# Patient Record
Sex: Male | Born: 1950 | Race: White | Hispanic: No | Smoking: Never smoker
Health system: Southern US, Community
[De-identification: ages and names within clinical notes are randomized; demographics above are authoritative.]

## PROBLEM LIST (undated history)

## (undated) DIAGNOSIS — L719 Rosacea, unspecified: Secondary | ICD-10-CM

## (undated) DIAGNOSIS — E109 Type 1 diabetes mellitus without complications: Secondary | ICD-10-CM

## (undated) DIAGNOSIS — E119 Type 2 diabetes mellitus without complications: Secondary | ICD-10-CM

## (undated) DIAGNOSIS — E785 Hyperlipidemia, unspecified: Secondary | ICD-10-CM

## (undated) DIAGNOSIS — E1159 Type 2 diabetes mellitus with other circulatory complications: Secondary | ICD-10-CM

## (undated) DIAGNOSIS — M1990 Unspecified osteoarthritis, unspecified site: Secondary | ICD-10-CM

## (undated) DIAGNOSIS — K219 Gastro-esophageal reflux disease without esophagitis: Secondary | ICD-10-CM

## (undated) HISTORY — DX: Gastro-esophageal reflux disease without esophagitis: K21.9

## (undated) HISTORY — PX: GALLBLADDER SURGERY: SHX652

## (undated) HISTORY — PX: CHOLECYSTECTOMY: SHX55

## (undated) HISTORY — DX: Hyperlipidemia, unspecified: E78.5

## (undated) HISTORY — DX: Type 1 diabetes mellitus without complications: E10.9

## (undated) HISTORY — PX: BACK SURGERY: SHX140

## (undated) HISTORY — DX: Type 2 diabetes mellitus without complications: E11.9

## (undated) HISTORY — PX: OTHER SURGICAL HISTORY: SHX169

## (undated) HISTORY — PX: LUMBAR DISC SURGERY: SHX700

---
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
## 2013-11-04 DIAGNOSIS — E782 Mixed hyperlipidemia: Secondary | ICD-10-CM | POA: Insufficient documentation

## 2013-11-04 DIAGNOSIS — E109 Type 1 diabetes mellitus without complications: Secondary | ICD-10-CM | POA: Insufficient documentation

## 2013-11-04 DIAGNOSIS — K529 Noninfective gastroenteritis and colitis, unspecified: Secondary | ICD-10-CM | POA: Insufficient documentation

## 2015-05-25 DIAGNOSIS — E1169 Type 2 diabetes mellitus with other specified complication: Secondary | ICD-10-CM | POA: Insufficient documentation

## 2015-05-25 DIAGNOSIS — E785 Hyperlipidemia, unspecified: Secondary | ICD-10-CM | POA: Insufficient documentation

## 2016-02-29 DIAGNOSIS — E1159 Type 2 diabetes mellitus with other circulatory complications: Secondary | ICD-10-CM

## 2016-02-29 DIAGNOSIS — I152 Hypertension secondary to endocrine disorders: Secondary | ICD-10-CM

## 2016-02-29 DIAGNOSIS — K219 Gastro-esophageal reflux disease without esophagitis: Secondary | ICD-10-CM | POA: Insufficient documentation

## 2016-02-29 HISTORY — DX: Hypertension secondary to endocrine disorders: I15.2

## 2016-02-29 HISTORY — DX: Type 2 diabetes mellitus with other circulatory complications: E11.59

## 2017-01-30 LAB — HM HEPATITIS C SCREENING LAB: HM Hepatitis Screen: NEGATIVE

## 2019-01-09 ENCOUNTER — Other Ambulatory Visit: Payer: Self-pay | Admitting: Physician Assistant

## 2019-01-09 NOTE — Telephone Encounter (Signed)
Please advise 

## 2019-01-21 ENCOUNTER — Ambulatory Visit: Payer: Self-pay | Admitting: Physician Assistant

## 2019-01-28 ENCOUNTER — Encounter: Payer: Self-pay | Admitting: Family Medicine

## 2019-01-28 ENCOUNTER — Other Ambulatory Visit: Payer: Self-pay

## 2019-01-28 ENCOUNTER — Ambulatory Visit (INDEPENDENT_AMBULATORY_CARE_PROVIDER_SITE_OTHER): Payer: Medicare Other | Admitting: Family Medicine

## 2019-01-28 VITALS — BP 137/82 | HR 102 | Temp 98.5°F | Ht 71.5 in | Wt 196.2 lb

## 2019-01-28 DIAGNOSIS — E782 Mixed hyperlipidemia: Secondary | ICD-10-CM

## 2019-01-28 DIAGNOSIS — Z23 Encounter for immunization: Secondary | ICD-10-CM

## 2019-01-28 DIAGNOSIS — E109 Type 1 diabetes mellitus without complications: Secondary | ICD-10-CM | POA: Diagnosis not present

## 2019-01-28 DIAGNOSIS — E1159 Type 2 diabetes mellitus with other circulatory complications: Secondary | ICD-10-CM | POA: Diagnosis not present

## 2019-01-28 DIAGNOSIS — I152 Hypertension secondary to endocrine disorders: Secondary | ICD-10-CM

## 2019-01-28 DIAGNOSIS — L719 Rosacea, unspecified: Secondary | ICD-10-CM | POA: Diagnosis not present

## 2019-01-28 DIAGNOSIS — I10 Essential (primary) hypertension: Secondary | ICD-10-CM

## 2019-01-28 DIAGNOSIS — K219 Gastro-esophageal reflux disease without esophagitis: Secondary | ICD-10-CM

## 2019-01-28 DIAGNOSIS — Z1211 Encounter for screening for malignant neoplasm of colon: Secondary | ICD-10-CM

## 2019-01-28 DIAGNOSIS — Z7689 Persons encountering health services in other specified circumstances: Secondary | ICD-10-CM

## 2019-01-28 HISTORY — DX: Rosacea, unspecified: L71.9

## 2019-01-28 LAB — BAYER DCA HB A1C WAIVED: HB A1C (BAYER DCA - WAIVED): 7 % — ABNORMAL HIGH (ref <7.0)

## 2019-01-28 MED ORDER — LEVEMIR FLEXTOUCH 100 UNIT/ML ~~LOC~~ SOPN: 48.0000 [IU] | PEN_INJECTOR | Freq: Two times a day (BID) | SUBCUTANEOUS | 1 refills | Status: DC

## 2019-01-28 MED ORDER — SHINGRIX 50 MCG/0.5ML IM SUSR: 0.5000 mL | Freq: Once | INTRAMUSCULAR | 0 refills | Status: AC

## 2019-01-28 NOTE — Progress Notes (Signed)
New Patient Office Visit  Assessment & Plan:  1. Type 1 diabetes mellitus without complications (HCC) Lab Results  Component Value Date   HGBA1C 7.0 (H) 01/28/2019    - Diabetes is at goal of A1c of 7. - Medications: continue current medications - Patient is currently taking a statin. Patient is taking an ACE-inhibitor/ARB.  - Last diabetic eye exam: not sure of date - record requested from [facility].  - Urine Microalbumin/Creat Ratio: today - CMP14+EGFR - Lipid panel - Microalbumin / creatinine urine ratio - Bayer DCA Hb A1c Waived - Insulin Detemir (LEVEMIR FLEXTOUCH) 100 UNIT/ML Pen; Inject 48 Units into the skin 2 (two) times daily.  Dispense: 90 mL; Refill: 1 - Instruction/counseling given: provided printed educational material  2. Hypertension associated with diabetes ([place]) - Well controlled on current regimen.  - CMP14+EGFR - Lipid panel  3. Mixed hyperlipidemia - Well controlled on current regimen.  - CMP14+EGFR - Lipid panel  4. Rosacea - Well controlled on current regimen.  - CMP14+EGFR  5. Gastroesophageal reflux disease, esophagitis presence not specified - Well controlled on current regimen.   6. Colon cancer screening - Cologuard  7. Immunization due - SHINGRIX injection; Inject 0.5 mLs into the muscle once for 1 dose.  Dispense: 0.5 mL; Refill: 0  8. Encounter to establish care   Follow-up: Return in about 3 months (around 04/29/2019) for follow-up of chronic medication conditions.   Hendricks Limes, MSN, APRN, FNP-C [facility]  Subjective:  Patient ID: Dustin Graham, male    DOB: Sep 01, 1950  Age: 68 y.o. MRN: [id number]  Patient Care Team: Loman Brooklyn, FNP as PCP - General (Family Medicine)  CC:  Chief Complaint  Patient presents with  . New Patient (Initial Visit)    HPI Dustin Graham presents to establish care. He is transferring care from Dr. Murrell Redden office as he has retired and the office has closed.    No complaints or concerns today.   Review of Systems  Constitutional: Negative for chills, fever, malaise/fatigue and weight loss.  HENT: Negative for congestion, ear discharge, ear pain, nosebleeds, sinus pain, sore throat and tinnitus.   Eyes: Negative for blurred vision, double vision, pain, discharge and redness.  Respiratory: Negative for cough, shortness of breath and wheezing.   Cardiovascular: Negative for chest pain, palpitations and leg swelling.  Gastrointestinal: Negative for abdominal pain, constipation, diarrhea, heartburn, nausea and vomiting.  Genitourinary: Negative for dysuria, frequency and urgency.  Musculoskeletal: Negative for myalgias.  Skin: Negative for rash.  Neurological: Negative for dizziness, seizures, weakness and headaches.  Psychiatric/Behavioral: Negative for depression, substance abuse and suicidal ideas. The patient is not nervous/anxious.      Current Outpatient Medications:  .  enalapril (VASOTEC) 10 MG tablet, Take 10 mg by mouth daily., Disp: , Rfl:  .  glucose blood (CONTOUR NEXT TEST) test strip, Test 3 times a day. Dx. E11.9, Disp: , Rfl:  .  insulin aspart (NOVOLOG FLEXPEN) 100 UNIT/ML FlexPen, INJECT 30 UNITS DOSE INTO THE SKIN 15 (FIFTEEN) MINUTES BEFORE MEALS., Disp: , Rfl:  .  Insulin Detemir (LEVEMIR FLEXTOUCH) 100 UNIT/ML Pen, Inject 48 Units into the skin 2 (two) times daily., Disp: 90 mL, Rfl: 1 .  Insulin Pen Needle 32G X 6 MM MISC, USE WITH INJECTION 6 TIMES DAILY, Disp: , Rfl:  .  omeprazole (PRILOSEC) 20 MG capsule, Take 20 mg by mouth daily., Disp: , Rfl:  .  OneTouch Delica Lancets 93Z MISC, 1 Device by Other route  2 (two) times daily. Dx:e11.9, Disp: , Rfl:  .  Pitavastatin Calcium (LIVALO) 4 MG TABS, Take 4 mg by mouth daily., Disp: , Rfl:  .  doxycycline (VIBRAMYCIN) 100 MG capsule, TAKE 1 CAPSULE BY MOUTH EVERY DAY, Disp: 90 capsule, Rfl: 1  Allergies  Allergen Reactions  . Codeine Nausea And Vomiting    Past Medical  History:  Diagnosis Date  . Diabetes mellitus without complication ([place])   . GERD (gastroesophageal reflux disease)   . Hypertension associated with diabetes ([place]) 02/29/2016  . Rosacea 01/28/2019    History reviewed. No pertinent surgical history.  Family History  Problem Relation Age of Onset  . Diabetes Paternal Aunt     Social History   Socioeconomic History  . Marital status: Divorced    Spouse name: Not on file  . Number of children: 1  . Years of education: 66  . Highest education level: Not on file  Occupational History  . Not on file  Social Needs  . Financial resource strain: Not on file  . Food insecurity    Worry: Not on file    Inability: Not on file  . Transportation needs    Medical: Not on file    Non-medical: Not on file  Tobacco Use  . Smoking status: Never Smoker  . Smokeless tobacco: Never Used  Substance and Sexual Activity  . Alcohol use: Yes    Comment: two shots daily  . Drug use: Never  . Sexual activity: Not on file  Lifestyle  . Physical activity    Days per week: Not on file    Minutes per session: Not on file  . Stress: Not on file  Relationships  . Social Herbalist on phone: Not on file    Gets together: Not on file    Attends religious service: Not on file    Active member of club or organization: Not on file    Attends meetings of clubs or organizations: Not on file    Relationship status: Not on file  . Intimate partner violence    Fear of current or ex partner: Not on file    Emotionally abused: Not on file    Physically abused: Not on file    Forced sexual activity: Not on file  Other Topics Concern  . Not on file  Social History Narrative  . Not on file    Objective:   Today's Vitals: BP 137/82   Pulse (!) 102   Temp 98.5 F (36.9 C) (Oral)   Ht 5' 11.5" (1.816 m)   Wt 196 lb 3.2 oz (89 kg)   BMI 26.98 kg/m   Physical Exam Vitals signs reviewed.  Constitutional:      General: He is not in acute  distress.    Appearance: Normal appearance. He is overweight. He is not ill-appearing, toxic-appearing or diaphoretic.  HENT:     Head: Normocephalic and atraumatic.  Eyes:     General: No scleral icterus.       Right eye: No discharge.        Left eye: No discharge.     Conjunctiva/sclera: Conjunctivae normal.  Neck:     Musculoskeletal: Normal range of motion.  Cardiovascular:     Rate and Rhythm: Normal rate and regular rhythm.     Heart sounds: Normal heart sounds. No murmur. No friction rub. No gallop.   Pulmonary:     Effort: Pulmonary effort is normal. No respiratory distress.  Breath sounds: Normal breath sounds. No stridor. No wheezing, rhonchi or rales.  Musculoskeletal: Normal range of motion.  Skin:    General: Skin is warm and dry.  Neurological:     Mental Status: He is alert and oriented to person, place, and time. Mental status is at baseline.  Psychiatric:        Mood and Affect: Mood normal.        Behavior: Behavior normal.        Thought Content: Thought content normal.        Judgment: Judgment normal.

## 2019-01-29 LAB — CMP14+EGFR
ALT: 36 IU/L (ref 0–44)
AST: 54 IU/L — ABNORMAL HIGH (ref 0–40)
Albumin/Globulin Ratio: 1.4 (ref 1.2–2.2)
Albumin: 4.2 g/dL (ref 3.8–4.8)
Alkaline Phosphatase: 125 IU/L — ABNORMAL HIGH (ref 39–117)
BUN/Creatinine Ratio: 12 (ref 10–24)
BUN: 8 mg/dL (ref 8–27)
Bilirubin Total: 0.9 mg/dL (ref 0.0–1.2)
CO2: 25 mmol/L (ref 20–29)
Calcium: 9.5 mg/dL (ref 8.6–10.2)
Chloride: 99 mmol/L (ref 96–106)
Creatinine, Ser: 0.66 mg/dL — ABNORMAL LOW (ref 0.76–1.27)
GFR calc Af Amer: 116 mL/min/{1.73_m2} (ref >59)
GFR calc non Af Amer: 100 mL/min/{1.73_m2} (ref >59)
Globulin, Total: 3.1 g/dL (ref 1.5–4.5)
Glucose: 182 mg/dL — ABNORMAL HIGH (ref 65–99)
Potassium: 5.3 mmol/L — ABNORMAL HIGH (ref 3.5–5.2)
Sodium: 139 mmol/L (ref 134–144)
Total Protein: 7.3 g/dL (ref 6.0–8.5)

## 2019-01-29 LAB — LIPID PANEL
Chol/HDL Ratio: 2.6 ratio (ref 0.0–5.0)
Cholesterol, Total: 206 mg/dL — ABNORMAL HIGH (ref 100–199)
HDL: 78 mg/dL (ref >39)
LDL Chol Calc (NIH): 116 mg/dL — ABNORMAL HIGH (ref 0–99)
Triglycerides: 66 mg/dL (ref 0–149)
VLDL Cholesterol Cal: 12 mg/dL (ref 5–40)

## 2019-01-30 ENCOUNTER — Other Ambulatory Visit: Payer: Self-pay | Admitting: Family Medicine

## 2019-02-12 ENCOUNTER — Other Ambulatory Visit: Payer: Self-pay | Admitting: Family Medicine

## 2019-04-21 ENCOUNTER — Telehealth: Payer: Self-pay | Admitting: Family Medicine

## 2019-04-21 NOTE — Telephone Encounter (Signed)
[email or web address]

## 2019-04-21 NOTE — Telephone Encounter (Signed)
Please call patient and follow-up on Cologuard that was ordered on 01/28/2019.  Encourage to collect and send back.

## 2019-05-01 NOTE — Telephone Encounter (Signed)
Patient confirmed he did receive Cologuard.  He has not even opened the box yet.  Encouraged him to complete and return.

## 2019-05-06 ENCOUNTER — Ambulatory Visit: Payer: Medicare Other | Admitting: Family Medicine

## 2019-05-06 ENCOUNTER — Other Ambulatory Visit: Payer: Self-pay | Admitting: Family Medicine

## 2019-06-10 LAB — HM DIABETES EYE EXAM

## 2019-06-29 NOTE — Progress Notes (Signed)
Assessment & Plan:  1. Type 1 diabetes mellitus without complications (HCC) Lab Results  Component Value Date   HGBA1C 6.9 07/01/2019   HGBA1C 7.0 (H) 01/28/2019  - Diabetes is at goal of A1c < 7. - Medications: continue current medications - Home glucose monitoring: continue 2-4 times/day - Patient is currently taking a statin. Patient is taking an ACE-inhibitor/ARB.  - Last foot exam: 07/01/2019 - Last diabetic eye exam: 05/2019 per patient - record requested from [facility].  - Urine Microalbumin/Creat Ratio: 2017 - Instruction/counseling given: discussed the need for weight loss, discussed diet and provided printed educational material - CMP14+EGFR - Lipid panel - Bayer DCA Hb A1c Waived - Insulin Detemir (LEVEMIR FLEXTOUCH) 100 UNIT/ML Pen; Inject 48 Units into the skin 2 (two) times daily.  Dispense: 90 mL; Refill: 1  2. Acute right-sided low back pain without sciatica - Education provided on low back exercises. Encouraged Ibuprofen 400-800 mg WITH Tylenol 500 mg every 6-8 hours as needed for pain, but to stop the BC powders. Also encouraged heating pad and muscle rubs which patient does not see himself doing.   3. Hypertension associated with diabetes ([place]) - Well controlled on current regimen.  - CMP14+EGFR - Lipid panel  4. Mixed hyperlipidemia - Well controlled on current regimen.  - CMP14+EGFR - Lipid panel  5. Callus of foot - Encouraged patient to soak foot and file callus down with pumice stone.   6. Healthcare maintenance - Encouraged patient to complete cologuard that he has at home. Scheduled for 1st COVID-19 vaccine tomorrow. Declined Shingrix and TDAP.   Return in about 6 months (around 12/29/2019) for follow-up of chronic medication conditions.  Hendricks Limes, MSN, APRN, FNP-C [facility]  Subjective:    Patient ID: Dustin Graham, male    DOB: 09-27-1950, 69 y.o.   MRN: [id number]  Patient Care Team: Loman Brooklyn, FNP as PCP  - General (Family Medicine)   Chief Complaint:  Chief Complaint  Patient presents with  . Medical Management of Chronic Issues    lower back for a months  . Diabetes    HPI: Dustin Graham is a 69 y.o. male presenting on 07/01/2019 for Medical Management of Chronic Issues (lower back for a months) and Diabetes  Diabetes: Patient presents for follow up of diabetes. Current symptoms include: none. Known diabetic complications: none. Medication compliance: yes. Current diet: in general, an "unhealthy" diet. Current exercise: none. Home blood sugar records: 2-4 times per day; fasting averages 120-130s. Is he  on ACE inhibitor or angiotensin II receptor blocker? Yes. Is he on a statin? Yes.   Lab Results  Component Value Date   HGBA1C 6.9 07/01/2019   HGBA1C 7.0 (H) 01/28/2019   Lab Results  Component Value Date   LDLCALC 116 (H) 01/28/2019   CREATININE 0.66 (L) 01/28/2019     New complaints: Back Pain: Patient presents for presents evaluation of low back problems.  Symptoms have been present for several months and include pain in right side of low back (aching in character; 5/10 in severity). He reports it is improving as two weeks ago he was experiencing what felt like a jolts of electricity and rated his pain 7/10. Initial inciting event: patinet was sleep walking and tripped over a chair.  Alleviating factors identifiable by patient are medication BC powder. Exacerbating factors identifiable by patient are movement - turning, standing, walking.    Social history:  Relevant past medical, surgical, family and social history reviewed and updated  as indicated. Interim medical history since our last visit reviewed.  Allergies and medications reviewed and updated.  DATA REVIEWED: CHART IN EPIC  ROS: Negative unless specifically indicated above in HPI.    Current Outpatient Medications:  .  Aspirin-Salicylamide-Caffeine (BC HEADACHE POWDER PO), Take by mouth., Disp: , Rfl:  .   doxycycline (VIBRAMYCIN) 100 MG capsule, TAKE 1 CAPSULE BY MOUTH EVERY DAY, Disp: 90 capsule, Rfl: 1 .  enalapril (VASOTEC) 10 MG tablet, Take 1 tablet (10 mg total) by mouth daily., Disp: 90 tablet, Rfl: 1 .  glucose blood (CONTOUR NEXT TEST) test strip, Test 3 times a day. Dx. E11.9, Disp: , Rfl:  .  insulin aspart (NOVOLOG FLEXPEN) 100 UNIT/ML FlexPen, INJECT 30 UNITS DOSE INTO THE SKIN 15 (FIFTEEN) MINUTES BEFORE MEALS., Disp: , Rfl:  .  Insulin Detemir (LEVEMIR FLEXTOUCH) 100 UNIT/ML Pen, Inject 48 Units into the skin 2 (two) times daily., Disp: 90 mL, Rfl: 1 .  Insulin Pen Needle 32G X 6 MM MISC, USE WITH INJECTION 6 TIMES DAILY, Disp: , Rfl:  .  omeprazole (PRILOSEC) 20 MG capsule, Take 1 capsule (20 mg total) by mouth daily., Disp: 90 capsule, Rfl: 1 .  OneTouch Delica Lancets 24O MISC, 1 Device by Other route 2 (two) times daily. Dx:e11.9, Disp: , Rfl:  .  Pitavastatin Calcium (LIVALO) 4 MG TABS, Take 1 tablet (4 mg total) by mouth daily., Disp: 90 tablet, Rfl: 1   Allergies  Allergen Reactions  . Codeine Nausea And Vomiting   Past Medical History:  Diagnosis Date  . Diabetes mellitus without complication ([place])   . GERD (gastroesophageal reflux disease)   . Hypertension associated with diabetes ([place]) 02/29/2016  . Rosacea 01/28/2019    History reviewed. No pertinent surgical history.  Social History   Socioeconomic History  . Marital status: Divorced    Spouse name: Not on file  . Number of children: 1  . Years of education: 87  . Highest education level: Not on file  Occupational History  . Not on file  Tobacco Use  . Smoking status: Never Smoker  . Smokeless tobacco: Never Used  Substance and Sexual Activity  . Alcohol use: Yes    Comment: two shots daily  . Drug use: Never  . Sexual activity: Not on file  Other Topics Concern  . Not on file  Social History Narrative  . Not on file   Social Determinants of Health   Financial Resource Strain:   . Difficulty of  Paying Living Expenses: Not on file  Food Insecurity:   . Worried About Charity fundraiser in the Last Year: Not on file  . Ran Out of Food in the Last Year: Not on file  Transportation Needs:   . Lack of Transportation (Medical): Not on file  . Lack of Transportation (Non-Medical): Not on file  Physical Activity:   . Days of Exercise per Week: Not on file  . Minutes of Exercise per Session: Not on file  Stress:   . Feeling of Stress : Not on file  Social Connections:   . Frequency of Communication with Friends and Family: Not on file  . Frequency of Social Gatherings with Friends and Family: Not on file  . Attends Religious Services: Not on file  . Active Member of Clubs or Organizations: Not on file  . Attends Archivist Meetings: Not on file  . Marital Status: Not on file  Intimate Partner Violence:   . Fear of  Current or Ex-Partner: Not on file  . Emotionally Abused: Not on file  . Physically Abused: Not on file  . Sexually Abused: Not on file        Objective:    BP 140/78 Comment: manual  Pulse (!) 108   Temp 97.8 F (36.6 C) (Temporal)   Ht 5' 11.5" (1.816 m)   Wt 198 lb 3.2 oz (89.9 kg)   SpO2 97%   BMI 27.26 kg/m   Physical Exam Vitals reviewed.  Constitutional:      General: He is not in acute distress.    Appearance: Normal appearance. He is overweight. He is not ill-appearing, toxic-appearing or diaphoretic.  HENT:     Head: Normocephalic and atraumatic.  Eyes:     General: No scleral icterus.       Right eye: No discharge.        Left eye: No discharge.     Conjunctiva/sclera: Conjunctivae normal.  Cardiovascular:     Rate and Rhythm: Normal rate and regular rhythm.     Heart sounds: Normal heart sounds. No murmur. No friction rub. No gallop.   Pulmonary:     Effort: Pulmonary effort is normal. No respiratory distress.     Breath sounds: Normal breath sounds. No stridor. No wheezing, rhonchi or rales.  Musculoskeletal:        General:  Normal range of motion.     Cervical back: Normal range of motion.     Lumbar back: Tenderness (right SI joint) present. No swelling, edema, deformity, signs of trauma, lacerations or spasms. Normal range of motion. No scoliosis.  Skin:    General: Skin is warm and dry.  Neurological:     Mental Status: He is alert and oriented to person, place, and time. Mental status is at baseline.  Psychiatric:        Mood and Affect: Mood normal.        Behavior: Behavior normal.        Thought Content: Thought content normal.        Judgment: Judgment normal.    Diabetic Foot Exam - Simple   Simple Foot Form Diabetic Foot exam was performed with the following findings: Yes 07/01/2019 11:31 AM  Visual Inspection See comments: Yes Sensation Testing Intact to touch and monofilament testing bilaterally: Yes Pulse Check Posterior Tibialis and Dorsalis pulse intact bilaterally: Yes Comments Small callus to the bottom of left foot just below pinky toe. Otherwise, no deformities, ulcerations, or other skin breakdown bilaterally.      No results found for: TSH No results found for: WBC, HGB, HCT, MCV, PLT Lab Results  Component Value Date   NA 139 01/28/2019   K 5.3 (H) 01/28/2019   CO2 25 01/28/2019   GLUCOSE 182 (H) 01/28/2019   BUN 8 01/28/2019   CREATININE 0.66 (L) 01/28/2019   BILITOT 0.9 01/28/2019   ALKPHOS 125 (H) 01/28/2019   AST 54 (H) 01/28/2019   ALT 36 01/28/2019   PROT 7.3 01/28/2019   ALBUMIN 4.2 01/28/2019   CALCIUM 9.5 01/28/2019   Lab Results  Component Value Date   CHOL 206 (H) 01/28/2019   Lab Results  Component Value Date   HDL 78 01/28/2019   Lab Results  Component Value Date   LDLCALC 116 (H) 01/28/2019   Lab Results  Component Value Date   TRIG 66 01/28/2019   Lab Results  Component Value Date   CHOLHDL 2.6 01/28/2019   Lab Results  Component Value Date  HGBA1C 7.0 (H) 01/28/2019

## 2019-06-30 ENCOUNTER — Other Ambulatory Visit: Payer: Self-pay

## 2019-07-01 ENCOUNTER — Encounter: Payer: Self-pay | Admitting: Family Medicine

## 2019-07-01 ENCOUNTER — Ambulatory Visit (INDEPENDENT_AMBULATORY_CARE_PROVIDER_SITE_OTHER): Payer: Medicare Other | Admitting: Family Medicine

## 2019-07-01 VITALS — BP 140/78 | HR 108 | Temp 97.8°F | Ht 71.5 in | Wt 198.2 lb

## 2019-07-01 DIAGNOSIS — E109 Type 1 diabetes mellitus without complications: Secondary | ICD-10-CM

## 2019-07-01 DIAGNOSIS — I10 Essential (primary) hypertension: Secondary | ICD-10-CM

## 2019-07-01 DIAGNOSIS — E1159 Type 2 diabetes mellitus with other circulatory complications: Secondary | ICD-10-CM | POA: Diagnosis not present

## 2019-07-01 DIAGNOSIS — E782 Mixed hyperlipidemia: Secondary | ICD-10-CM | POA: Diagnosis not present

## 2019-07-01 DIAGNOSIS — M545 Low back pain, unspecified: Secondary | ICD-10-CM

## 2019-07-01 DIAGNOSIS — Z0000 Encounter for general adult medical examination without abnormal findings: Secondary | ICD-10-CM

## 2019-07-01 DIAGNOSIS — L84 Corns and callosities: Secondary | ICD-10-CM

## 2019-07-01 LAB — BAYER DCA HB A1C WAIVED: HB A1C (BAYER DCA - WAIVED): 6.9 % (ref <7.0)

## 2019-07-01 MED ORDER — OMEPRAZOLE 20 MG PO CPDR: 20.0000 mg | DELAYED_RELEASE_CAPSULE | Freq: Every day | ORAL | 1 refills | Status: DC

## 2019-07-01 MED ORDER — LEVEMIR FLEXTOUCH 100 UNIT/ML ~~LOC~~ SOPN: 48.0000 [IU] | PEN_INJECTOR | Freq: Two times a day (BID) | SUBCUTANEOUS | 1 refills | Status: DC

## 2019-07-01 MED ORDER — DOXYCYCLINE HYCLATE 100 MG PO CAPS: ORAL_CAPSULE | ORAL | 1 refills | Status: DC

## 2019-07-01 MED ORDER — ENALAPRIL MALEATE 10 MG PO TABS: 10.0000 mg | ORAL_TABLET | Freq: Every day | ORAL | 1 refills | Status: DC

## 2019-07-01 MED ORDER — LIVALO 4 MG PO TABS: 4.0000 mg | ORAL_TABLET | Freq: Every day | ORAL | 1 refills | Status: DC

## 2019-07-01 NOTE — Patient Instructions (Signed)
Muscle rubs Heating pad  Low Back Sprain or Strain Rehab Ask your health care provider which exercises are safe for you. Do exercises exactly as told by your health care provider and adjust them as directed. It is normal to feel mild stretching, pulling, tightness, or discomfort as you do these exercises. Stop right away if you feel sudden pain or your pain gets worse. Do not begin these exercises until told by your health care provider. Stretching and range-of-motion exercises These exercises warm up your muscles and joints and improve the movement and flexibility of your back. These exercises also help to relieve pain, numbness, and tingling. Lumbar rotation  1. Lie on your back on a firm surface and bend your knees. 2. Straighten your arms out to your sides so each arm forms a 90-degree angle (right angle) with a side of your body. 3. Slowly move (rotate) both of your knees to one side of your body until you feel a stretch in your lower back (lumbar). Try not to let your shoulders lift off the floor. 4. Hold this position for __________ seconds. 5. Tense your abdominal muscles and slowly move your knees back to the starting position. 6. Repeat this exercise on the other side of your body. Repeat __________ times. Complete this exercise __________ times a day. Single knee to chest  1. Lie on your back on a firm surface with both legs straight. 2. Bend one of your knees. Use your hands to move your knee up toward your chest until you feel a gentle stretch in your lower back and buttock. ? Hold your leg in this position by holding on to the front of your knee. ? Keep your other leg as straight as possible. 3. Hold this position for __________ seconds. 4. Slowly return to the starting position. 5. Repeat with your other leg. Repeat __________ times. Complete this exercise __________ times a day. Prone extension on elbows  1. Lie on your abdomen on a firm surface (prone position). 2. Prop  yourself up on your elbows. 3. Use your arms to help lift your chest up until you feel a gentle stretch in your abdomen and your lower back. ? This will place some of your body weight on your elbows. If this is uncomfortable, try stacking pillows under your chest. ? Your hips should stay down, against the surface that you are lying on. Keep your hip and back muscles relaxed. 4. Hold this position for __________ seconds. 5. Slowly relax your upper body and return to the starting position. Repeat __________ times. Complete this exercise __________ times a day. Strengthening exercises These exercises build strength and endurance in your back. Endurance is the ability to use your muscles for a long time, even after they get tired. Pelvic tilt This exercise strengthens the muscles that lie deep in the abdomen. 1. Lie on your back on a firm surface. Bend your knees and keep your feet flat on the floor. 2. Tense your abdominal muscles. Tip your pelvis up toward the ceiling and flatten your lower back into the floor. ? To help with this exercise, you may place a small towel under your lower back and try to push your back into the towel. 3. Hold this position for __________ seconds. 4. Let your muscles relax completely before you repeat this exercise. Repeat __________ times. Complete this exercise __________ times a day. Alternating arm and leg raises  1. Get on your hands and knees on a firm surface. If you are  on a hard floor, you may want to use padding, such as an exercise mat, to cushion your knees. 2. Line up your arms and legs. Your hands should be directly below your shoulders, and your knees should be directly below your hips. 3. Lift your left leg behind you. At the same time, raise your right arm and straighten it in front of you. ? Do not lift your leg higher than your hip. ? Do not lift your arm higher than your shoulder. ? Keep your abdominal and back muscles tight. ? Keep your hips  facing the ground. ? Do not arch your back. ? Keep your balance carefully, and do not hold your breath. 4. Hold this position for __________ seconds. 5. Slowly return to the starting position. 6. Repeat with your right leg and your left arm. Repeat __________ times. Complete this exercise __________ times a day. Abdominal set with straight leg raise  1. Lie on your back on a firm surface. 2. Bend one of your knees and keep your other leg straight. 3. Tense your abdominal muscles and lift your straight leg up, 4-6 inches (10-15 cm) off the ground. 4. Keep your abdominal muscles tight and hold this position for __________ seconds. ? Do not hold your breath. ? Do not arch your back. Keep it flat against the ground. 5. Keep your abdominal muscles tense as you slowly lower your leg back to the starting position. 6. Repeat with your other leg. Repeat __________ times. Complete this exercise __________ times a day. Single leg lower with bent knees 1. Lie on your back on a firm surface. 2. Tense your abdominal muscles and lift your feet off the floor, one foot at a time, so your knees and hips are bent in 90-degree angles (right angles). ? Your knees should be over your hips and your lower legs should be parallel to the floor. 3. Keeping your abdominal muscles tense and your knee bent, slowly lower one of your legs so your toe touches the ground. 4. Lift your leg back up to return to the starting position. ? Do not hold your breath. ? Do not let your back arch. Keep your back flat against the ground. 5. Repeat with your other leg. Repeat __________ times. Complete this exercise __________ times a day. Posture and body mechanics Good posture and healthy body mechanics can help to relieve stress in your body's tissues and joints. Body mechanics refers to the movements and positions of your body while you do your daily activities. Posture is part of body mechanics. Good posture means:  Your spine  is in its natural S-curve position (neutral).  Your shoulders are pulled back slightly.  Your head is not tipped forward. Follow these guidelines to improve your posture and body mechanics in your everyday activities. Standing   When standing, keep your spine neutral and your feet about hip width apart. Keep a slight bend in your knees. Your ears, shoulders, and hips should line up.  When you do a task in which you stand in one place for a long time, place one foot up on a stable object that is 2-4 inches (5-10 cm) high, such as a footstool. This helps keep your spine neutral. Sitting   When sitting, keep your spine neutral and keep your feet flat on the floor. Use a footrest, if necessary, and keep your thighs parallel to the floor. Avoid rounding your shoulders, and avoid tilting your head forward.  When working at a desk or  a computer, keep your desk at a height where your hands are slightly lower than your elbows. Slide your chair under your desk so you are close enough to maintain good posture.  When working at a computer, place your monitor at a height where you are looking straight ahead and you do not have to tilt your head forward or downward to look at the screen. Resting  When lying down and resting, avoid positions that are most painful for you.  If you have pain with activities such as sitting, bending, stooping, or squatting, lie in a position in which your body does not bend very much. For example, avoid curling up on your side with your arms and knees near your chest (fetal position).  If you have pain with activities such as standing for a long time or reaching with your arms, lie with your spine in a neutral position and bend your knees slightly. Try the following positions: ? Lying on your side with a pillow between your knees. ? Lying on your back with a pillow under your knees. Lifting   When lifting objects, keep your feet at least shoulder width apart and  tighten your abdominal muscles.  Bend your knees and hips and keep your spine neutral. It is important to lift using the strength of your legs, not your back. Do not lock your knees straight out.  Always ask for help to lift heavy or awkward objects. This information is not intended to replace advice given to you by your health care provider. Make sure you discuss any questions you have with your health care provider. Document Revised: 09/05/2018 Document Reviewed: 06/05/2018 Elsevier Patient Education  [place].   Diabetes Mellitus and Nutrition, Adult When you have diabetes (diabetes mellitus), it is very important to have healthy eating habits because your blood sugar (glucose) levels are greatly affected by what you eat and drink. Eating healthy foods in the appropriate amounts, at about the same times every day, can help you:  Control your blood glucose.  Lower your risk of heart disease.  Improve your blood pressure.  Reach or maintain a healthy weight. Every person with diabetes is different, and each person has different needs for a meal plan. Your health care provider may recommend that you work with a diet and nutrition specialist (dietitian) to make a meal plan that is best for you. Your meal plan may vary depending on factors such as:  The calories you need.  The medicines you take.  Your weight.  Your blood glucose, blood pressure, and cholesterol levels.  Your activity level.  Other health conditions you have, such as heart or kidney disease. How do carbohydrates affect me? Carbohydrates, also called carbs, affect your blood glucose level more than any other type of food. Eating carbs naturally raises the amount of glucose in your blood. Carb counting is a method for keeping track of how many carbs you eat. Counting carbs is important to keep your blood glucose at a healthy level, especially if you use insulin or take certain oral diabetes medicines. It is  important to know how many carbs you can safely have in each meal. This is different for every person. Your dietitian can help you calculate how many carbs you should have at each meal and for each snack. Foods that contain carbs include:  Bread, cereal, rice, pasta, and crackers.  Potatoes and corn.  Peas, beans, and lentils.  Milk and yogurt.  Fruit and juice.  Desserts,  such as cakes, cookies, ice cream, and candy. How does alcohol affect me? Alcohol can cause a sudden decrease in blood glucose (hypoglycemia), especially if you use insulin or take certain oral diabetes medicines. Hypoglycemia can be a life-threatening condition. Symptoms of hypoglycemia (sleepiness, dizziness, and confusion) are similar to symptoms of having too much alcohol. If your health care provider says that alcohol is safe for you, follow these guidelines:  Limit alcohol intake to no more than 1 drink per day for nonpregnant women and 2 drinks per day for men. One drink equals 12 oz of beer, 5 oz of wine, or 1 oz of hard liquor.  Do not drink on an empty stomach.  Keep yourself hydrated with water, diet soda, or unsweetened iced tea.  Keep in mind that regular soda, juice, and other mixers may contain a lot of sugar and must be counted as carbs. What are tips for following this plan?  Reading food labels  Start by checking the serving size on the "Nutrition Facts" label of packaged foods and drinks. The amount of calories, carbs, fats, and other nutrients listed on the label is based on one serving of the item. Many items contain more than one serving per package.  Check the total grams (g) of carbs in one serving. You can calculate the number of servings of carbs in one serving by dividing the total carbs by 15. For example, if a food has 30 g of total carbs, it would be equal to 2 servings of carbs.  Check the number of grams (g) of saturated and trans fats in one serving. Choose foods that have low or  no amount of these fats.  Check the number of milligrams (mg) of salt (sodium) in one serving. Most people should limit total sodium intake to less than 2,300 mg per day.  Always check the nutrition information of foods labeled as "low-fat" or "nonfat". These foods may be higher in added sugar or refined carbs and should be avoided.  Talk to your dietitian to identify your daily goals for nutrients listed on the label. Shopping  Avoid buying canned, premade, or processed foods. These foods tend to be high in fat, sodium, and added sugar.  Shop around the outside edge of the grocery store. This includes fresh fruits and vegetables, bulk grains, fresh meats, and fresh dairy. Cooking  Use low-heat cooking methods, such as baking, instead of high-heat cooking methods like deep frying.  Cook using healthy oils, such as olive, canola, or sunflower oil.  Avoid cooking with butter, cream, or high-fat meats. Meal planning  Eat meals and snacks regularly, preferably at the same times every day. Avoid going long periods of time without eating.  Eat foods high in fiber, such as fresh fruits, vegetables, beans, and whole grains. Talk to your dietitian about how many servings of carbs you can eat at each meal.  Eat 4-6 ounces (oz) of lean protein each day, such as lean meat, chicken, fish, eggs, or tofu. One oz of lean protein is equal to: ? 1 oz of meat, chicken, or fish. ? 1 egg. ?  cup of tofu.  Eat some foods each day that contain healthy fats, such as avocado, nuts, seeds, and fish. Lifestyle  Check your blood glucose regularly.  Exercise regularly as told by your health care provider. This may include: ? 150 minutes of moderate-intensity or vigorous-intensity exercise each week. This could be brisk walking, biking, or water aerobics. ? Stretching and doing strength exercises,  such as yoga or weightlifting, at least 2 times a week.  Take medicines as told by your health care  provider.  Do not use any products that contain nicotine or tobacco, such as cigarettes and e-cigarettes. If you need help quitting, ask your health care provider.  Work with a Social worker or diabetes educator to identify strategies to manage stress and any emotional and social challenges. Questions to ask a health care provider  Do I need to meet with a diabetes educator?  Do I need to meet with a dietitian?  What number can I call if I have questions?  When are the best times to check my blood glucose? Where to find more information:  American Diabetes Association: diabetes.org  Academy of Nutrition and Dietetics: www.eatright.[employer] of Diabetes and Digestive and Kidney Diseases (NIH): [employer] Summary  A healthy meal plan will help you control your blood glucose and maintain a healthy lifestyle.  Working with a diet and nutrition specialist (dietitian) can help you make a meal plan that is best for you.  Keep in mind that carbohydrates (carbs) and alcohol have immediate effects on your blood glucose levels. It is important to count carbs and to use alcohol carefully. This information is not intended to replace advice given to you by your health care provider. Make sure you discuss any questions you have with your health care provider. Document Revised: 04/26/2017 Document Reviewed: 06/18/2016 Elsevier Patient Education  2020 [employer].

## 2019-07-02 ENCOUNTER — Encounter: Payer: Self-pay | Admitting: Family Medicine

## 2019-07-02 LAB — CMP14+EGFR
ALT: 29 IU/L (ref 0–44)
AST: 47 IU/L — ABNORMAL HIGH (ref 0–40)
Albumin/Globulin Ratio: 1.4 (ref 1.2–2.2)
Albumin: 4.3 g/dL (ref 3.8–4.8)
Alkaline Phosphatase: 115 IU/L (ref 39–117)
BUN/Creatinine Ratio: 9 — ABNORMAL LOW (ref 10–24)
BUN: 6 mg/dL — ABNORMAL LOW (ref 8–27)
Bilirubin Total: 1.2 mg/dL (ref 0.0–1.2)
CO2: 25 mmol/L (ref 20–29)
Calcium: 9.4 mg/dL (ref 8.6–10.2)
Chloride: 100 mmol/L (ref 96–106)
Creatinine, Ser: 0.67 mg/dL — ABNORMAL LOW (ref 0.76–1.27)
GFR calc Af Amer: 114 mL/min/{1.73_m2} (ref >59)
GFR calc non Af Amer: 99 mL/min/{1.73_m2} (ref >59)
Globulin, Total: 3.1 g/dL (ref 1.5–4.5)
Glucose: 217 mg/dL — ABNORMAL HIGH (ref 65–99)
Potassium: 5.2 mmol/L (ref 3.5–5.2)
Sodium: 142 mmol/L (ref 134–144)
Total Protein: 7.4 g/dL (ref 6.0–8.5)

## 2019-07-02 LAB — LIPID PANEL
Chol/HDL Ratio: 2.4 ratio (ref 0.0–5.0)
Cholesterol, Total: 216 mg/dL — ABNORMAL HIGH (ref 100–199)
HDL: 89 mg/dL (ref >39)
LDL Chol Calc (NIH): 114 mg/dL — ABNORMAL HIGH (ref 0–99)
Triglycerides: 76 mg/dL (ref 0–149)
VLDL Cholesterol Cal: 13 mg/dL (ref 5–40)

## 2019-07-07 ENCOUNTER — Other Ambulatory Visit: Payer: Self-pay | Admitting: Family Medicine

## 2019-07-07 DIAGNOSIS — E782 Mixed hyperlipidemia: Secondary | ICD-10-CM

## 2019-07-07 MED ORDER — ATORVASTATIN CALCIUM 40 MG PO TABS: 40.0000 mg | ORAL_TABLET | Freq: Every day | ORAL | 2 refills | Status: DC

## 2019-07-14 ENCOUNTER — Telehealth: Payer: Self-pay | Admitting: Family Medicine

## 2019-07-15 NOTE — Telephone Encounter (Signed)
Someone had already reviewed labs with patient.  He was calling to confirm he is supposed to go ahead and start the Atorvastatin.  Advised patient to start medication.

## 2019-08-12 ENCOUNTER — Other Ambulatory Visit: Payer: Self-pay | Admitting: Family Medicine

## 2019-08-12 DIAGNOSIS — E1169 Type 2 diabetes mellitus with other specified complication: Secondary | ICD-10-CM

## 2019-08-12 DIAGNOSIS — E1159 Type 2 diabetes mellitus with other circulatory complications: Secondary | ICD-10-CM

## 2019-08-17 ENCOUNTER — Encounter: Payer: Self-pay | Admitting: Family Medicine

## 2019-08-17 ENCOUNTER — Other Ambulatory Visit: Payer: Self-pay | Admitting: Family Medicine

## 2019-08-17 ENCOUNTER — Ambulatory Visit (INDEPENDENT_AMBULATORY_CARE_PROVIDER_SITE_OTHER): Payer: Medicare Other | Admitting: Family Medicine

## 2019-08-17 DIAGNOSIS — L309 Dermatitis, unspecified: Secondary | ICD-10-CM | POA: Diagnosis not present

## 2019-08-17 DIAGNOSIS — E109 Type 1 diabetes mellitus without complications: Secondary | ICD-10-CM

## 2019-08-17 MED ORDER — NYSTATIN-TRIAMCINOLONE 100000-0.1 UNIT/GM-% EX OINT: 1.0000 "application " | TOPICAL_OINTMENT | Freq: Two times a day (BID) | CUTANEOUS | 3 refills | Status: DC

## 2019-08-17 NOTE — Progress Notes (Signed)
Virtual Visit via telephone Note  I connected with Dustin Graham on 08/17/19 at 1316 by telephone and verified that I am speaking with the correct person using two identifiers. Dustin Graham is currently located at work and no other people are currently with her during visit. The provider, Fransisca Kaufmann Rafi Kenneth, MD is located in their office at time of visit.  Call ended at 1325  I discussed the limitations, risks, security and privacy concerns of performing an evaluation and management service by telephone and the availability of in person appointments. I also discussed with the patient that there may be a patient responsible charge related to this service. The patient expressed understanding and agreed to proceed.   History and Present Illness: Patient is calling in today with complaints of rash on his right neck and used an OTC cream and it will improve and then come back.  It itches and burns and then improves.  The rash has come back 3 times.  The rash is on his right side under ear to shoulder.  It feels rough and lessened with cream.   1. Dermatitis     Outpatient Encounter Medications as of 08/17/2019  Medication Sig  . Aspirin-Salicylamide-Caffeine (BC HEADACHE POWDER PO) Take by mouth.  Marland Kitchen atorvastatin (LIPITOR) 40 MG tablet Take 1 tablet (40 mg total) by mouth daily.  Marland Kitchen doxycycline (VIBRAMYCIN) 100 MG capsule TAKE 1 CAPSULE BY MOUTH EVERY DAY  . enalapril (VASOTEC) 10 MG tablet Take 1 tablet (10 mg total) by mouth daily.  Marland Kitchen glucose blood (CONTOUR NEXT TEST) test strip Test 3 times a day. Dx. E11.9  . Insulin Detemir (LEVEMIR FLEXTOUCH) 100 UNIT/ML Pen Inject 48 Units into the skin 2 (two) times daily.  . Insulin Pen Needle 32G X 6 MM MISC USE WITH INJECTION 6 TIMES DAILY  . NOVOLOG FLEXPEN 100 UNIT/ML FlexPen INJECT 30 UNITS DOSE INTO THE SKIN 15 (FIFTEEN) MINUTES BEFORE MEALS.  Marland Kitchen nystatin-triamcinolone ointment (MYCOLOG) Apply 1 application topically 2 (two) times daily.  Marland Kitchen  omeprazole (PRILOSEC) 20 MG capsule Take 1 capsule (20 mg total) by mouth daily.  Dustin Graham Delica Lancets [id number] MISC 1 Device by Other route 2 (two) times daily. Dx:e11.9   No facility-administered encounter medications on file as of 08/17/2019.    Review of Systems  Constitutional: Negative for chills and fever.  Respiratory: Negative for shortness of breath and wheezing.   Cardiovascular: Negative for chest pain and leg swelling.  Musculoskeletal: Negative for back pain and gait problem.  Skin: Positive for rash. Negative for color change.  All other systems reviewed and are negative.   Observations/Objective: Patient sounds comfortable and in no acute distress  Assessment and Plan: Problem List Items Addressed This Visit    None    Visit Diagnoses    Dermatitis    -  Primary   Relevant Medications   nystatin-triamcinolone ointment (MYCOLOG)      Dermatitis on right side of his neck, sounds like it could be fungal or eczema-like, will give nystatin triamcinolone Follow up plan: Return if symptoms worsen or fail to improve.     I discussed the assessment and treatment plan with the patient. The patient was provided an opportunity to ask questions and all were answered. The patient agreed with the plan and demonstrated an understanding of the instructions.   The patient was advised to call back or seek an in-person evaluation if the symptoms worsen or if the condition fails to improve as anticipated.  The  above assessment and management plan was discussed with the patient. The patient verbalized understanding of and has agreed to the management plan. Patient is aware to call the clinic if symptoms persist or worsen. Patient is aware when to return to the clinic for a follow-up visit. Patient educated on when it is appropriate to go to the emergency department.    I provided 9 minutes of non-face-to-face time during this encounter.    Worthy Rancher, MD

## 2019-08-20 ENCOUNTER — Telehealth: Payer: Self-pay | Admitting: Family Medicine

## 2019-08-20 NOTE — Telephone Encounter (Signed)
Patient seen Dr. Warrick Parisian 3/22 for Dermatitis.  Patient was given Nystatin and states his rash is spreading.  States that it is now also on the left side and down chest. Please advise

## 2019-08-20 NOTE — Telephone Encounter (Signed)
Per Dr. Warrick Parisian, patient scheduled for appointment tomorrow, 08/21/2019.

## 2019-08-20 NOTE — Telephone Encounter (Signed)
Yes please have him come in for a visit as soon as possible, if there are any openings tomorrow put him on a call day.

## 2019-08-21 ENCOUNTER — Ambulatory Visit: Payer: Medicare Other | Admitting: Family Medicine

## 2019-08-24 ENCOUNTER — Telehealth: Payer: Self-pay | Admitting: Family Medicine

## 2019-08-24 DIAGNOSIS — E1169 Type 2 diabetes mellitus with other specified complication: Secondary | ICD-10-CM

## 2019-08-24 DIAGNOSIS — E109 Type 1 diabetes mellitus without complications: Secondary | ICD-10-CM

## 2019-08-24 DIAGNOSIS — E1159 Type 2 diabetes mellitus with other circulatory complications: Secondary | ICD-10-CM

## 2019-08-24 MED ORDER — LEVEMIR FLEXTOUCH 100 UNIT/ML ~~LOC~~ SOPN: 48.0000 [IU] | PEN_INJECTOR | Freq: Two times a day (BID) | SUBCUTANEOUS | 0 refills | Status: DC

## 2019-08-24 MED ORDER — NOVOLOG FLEXPEN 100 UNIT/ML ~~LOC~~ SOPN: PEN_INJECTOR | SUBCUTANEOUS | 0 refills | Status: DC

## 2019-08-24 NOTE — Telephone Encounter (Signed)
Pt usually gets a 90d supply

## 2019-08-24 NOTE — Telephone Encounter (Signed)
  Prescription Request  08/24/2019  What is the name of the medication or equipment? Levimir & Novalog  Have you contacted your pharmacy to request a refill? (if applicable) Yes  Which pharmacy would you like this sent to? [facility]   Patient notified that their request is being sent to the clinical staff for review and that they should receive a response within 2 business days.   Blanch Media' pt.  Please call him.

## 2019-08-26 ENCOUNTER — Telehealth: Payer: Self-pay | Admitting: Family Medicine

## 2019-08-26 NOTE — Telephone Encounter (Signed)
Patient has medication

## 2019-09-02 ENCOUNTER — Ambulatory Visit (INDEPENDENT_AMBULATORY_CARE_PROVIDER_SITE_OTHER): Payer: Medicare Other

## 2019-09-02 DIAGNOSIS — Z0000 Encounter for general adult medical examination without abnormal findings: Secondary | ICD-10-CM | POA: Diagnosis not present

## 2019-09-02 NOTE — Progress Notes (Signed)
MEDICARE ANNUAL WELLNESS VISIT  09/02/2019  Telephone Visit Disclaimer This Medicare AWV was conducted by telephone due to national recommendations for restrictions regarding the COVID-19 Pandemic (e.g. social distancing).  I verified, using two identifiers, that I am speaking with [place] Graham or their authorized healthcare agent. I discussed the limitations, risks, security, and privacy concerns of performing an evaluation and management service by telephone and the potential availability of an in-person appointment in the future. The patient expressed understanding and agreed to proceed.   Subjective:  [place] is a 69 y.o. male patient of [place], FNP who had a Medicare Annual Wellness Visit today via telephone. [place] resides in [place] and lives alone. He is divorced and has one daughter that lives in nearby [place]. He is a very pleasant man and currently works full time as a Environmental health practitioner. He stays quite active but doesn't have a regular exercise routine. He attended some college in [place] and also [place] but never received a degree.   Patient Care Team: [place], FNP as PCP - General (Family Medicine)  Advanced Directives 09/02/2019  Does Patient Have a Medical Advance Directive? No  Would patient like information on creating a medical advance directive? No - Patient declined    Hospital Utilization Over the Past 12 Months: # of hospitalizations or ER visits: 1 # of surgeries: 0  Review of Systems    Patient reports that his overall health is unchanged compared to last year.  History obtained from chart review  Patient Reported Readings (BP, Pulse, CBG, Weight, etc) none  Pain Assessment Pain : No/denies pain     Current Medications & Allergies (verified) Allergies as of 09/02/2019      Reactions   Codeine Nausea And Vomiting      Medication List       Accurate as of September 02, 2019  2:40 PM. If you have any questions, ask your  nurse or doctor.        atorvastatin 40 MG tablet Commonly known as: LIPITOR Take 1 tablet (40 mg total) by mouth daily.   BC HEADACHE POWDER PO Take by mouth.   Contour Next Test test strip Generic drug: glucose blood Test 3 times a day. Dx. E11.9   doxycycline 100 MG capsule Commonly known as: VIBRAMYCIN TAKE 1 CAPSULE BY MOUTH EVERY DAY   enalapril 10 MG tablet Commonly known as: VASOTEC Take 1 tablet (10 mg total) by mouth daily.   Insulin Pen Needle 32G X 6 MM Misc USE WITH INJECTION 6 TIMES DAILY   Levemir FlexTouch 100 UNIT/ML FlexPen Generic drug: insulin detemir Inject 48 Units into the skin 2 (two) times daily.   NovoLOG FlexPen 100 UNIT/ML FlexPen Generic drug: insulin aspart INJECT 30 UNITS DOSE INTO THE SKIN 15 (FIFTEEN) MINUTES BEFORE MEALS.   nystatin-triamcinolone ointment Commonly known as: MYCOLOG Apply 1 application topically 2 (two) times daily.   omeprazole 20 MG capsule Commonly known as: PRILOSEC Take 1 capsule (20 mg total) by mouth daily.   OneTouch Delica Lancets [id number] Misc 1 Device by Other route 2 (two) times daily. Dx:e11.9       History (reviewed): Past Medical History:  Diagnosis Date  . Diabetes mellitus without complication ([place])   . GERD (gastroesophageal reflux disease)   . Hypertension associated with diabetes ([place]) 02/29/2016  . Rosacea 01/28/2019   Past Surgical History:  Procedure Laterality Date  . broken nose     Family History  Problem  Relation Age of Onset  . Diabetes Paternal Aunt    Social History   Socioeconomic History  . Marital status: Divorced    Spouse name: Not on file  . Number of children: 1  . Years of education: 11  . Highest education level: Some college, no degree  Occupational History  . Not on file  Tobacco Use  . Smoking status: Never Smoker  . Smokeless tobacco: Never Used  Substance and Sexual Activity  . Alcohol use: Yes    Comment: two shots daily  . Drug use: Not Currently  .  Sexual activity: Not on file  Other Topics Concern  . Not on file  Social History Narrative  . Not on file   Social Determinants of Health   Financial Resource Strain:   . Difficulty of Paying Living Expenses:   Food Insecurity:   . Worried About Charity fundraiser in the Last Year:   . Arboriculturist in the Last Year:   Transportation Needs:   . Film/video editor (Medical):   [place] Lack of Transportation (Non-Medical):   Physical Activity:   . Days of Exercise per Week:   . Minutes of Exercise per Session:   Stress:   . Feeling of Stress :   Social Connections:   . Frequency of Communication with Friends and Family:   . Frequency of Social Gatherings with Friends and Family:   . Attends Religious Services:   . Active Member of Clubs or Organizations:   . Attends Archivist Meetings:   Marland Kitchen Marital Status:     Activities of Daily Living In your present state of health, do you have any difficulty performing the following activities: 09/02/2019  Hearing? N  Vision? Y  Comment Wears glasses all the time  Difficulty concentrating or making decisions? N  Walking or climbing stairs? N  Dressing or bathing? N  Doing errands, shopping? N  Preparing Food and eating ? N  Using the Toilet? N  In the past six months, have you accidently leaked urine? N  Do you have problems with loss of bowel control? N  Managing your Medications? N  Managing your Finances? N  Housekeeping or managing your Housekeeping? N    Patient Education/ Literacy How often do you need to have someone help you when you read instructions, pamphlets, or other written materials from your doctor or pharmacy?: 1 - Never What is the last grade level you completed in school?: Some college  Exercise Current Exercise Habits: The patient does not participate in regular exercise at present, Exercise limited by: None identified  Diet Patient reports consuming 3 meals a day and 2 snack(s) a day Patient  reports that his primary diet is: Regular Patient reports that she does have regular access to food.   Depression Screen PHQ 2/9 Scores 07/01/2019 01/28/2019  PHQ - 2 Score 0 0     Fall Risk Fall Risk  09/02/2019 07/01/2019 01/28/2019  Falls in the past year? 1 1 1   Number falls in past yr: 0 0 0  Comment - - Sleep walking and no injury.  Injury with Fall? 0 1 -  Comment - back pain -     Objective:  [place] seemed alert and oriented and he participated appropriately during our telephone visit.  Blood Pressure Weight BMI  BP Readings from Last 3 Encounters:  07/01/19 140/78  01/28/19 137/82   Wt Readings from Last 3 Encounters:  07/01/19 198 lb  3.2 oz (89.9 kg)  01/28/19 196 lb 3.2 oz (89 kg)   BMI Readings from Last 1 Encounters:  07/01/19 27.26 kg/m    *Unable to obtain current vital signs, weight, and BMI due to telephone visit type  Hearing/Vision  . Jakeb did not seem to have difficulty with hearing/understanding during the telephone conversation . Reports that he has had a formal eye exam by an eye care professional within the past year . Reports that he has not had a formal hearing evaluation within the past year *Unable to fully assess hearing and vision during telephone visit type  Cognitive Function: 6CIT Screen 09/02/2019  What Year? 0 points  What month? 0 points  What time? 0 points  Count back from 20 0 points  Months in reverse 0 points  Repeat phrase 0 points  Total Score 0   (Normal:0-7, Significant for Dysfunction: >8)  Normal Cognitive Function Screening: Yes   Immunization & Health Maintenance Record Immunization History  Administered Date(s) Administered  . Influenza Split 03/03/2014  . Influenza, High Dose Seasonal PF 01/21/2019  . Influenza-Unspecified 01/23/2019  . Pneumococcal Conjugate-13 09/26/2016  . Pneumococcal Polysaccharide-23 07/23/2018    Health Maintenance  Topic Date Due  . Fecal DNA (Cologuard)  01/04/2020 (Originally  05/20/2001)  . TETANUS/TDAP  06/30/2020 (Originally 05/20/1970)  . INFLUENZA VACCINE  12/27/2019  . HEMOGLOBIN A1C  12/29/2019  . OPHTHALMOLOGY EXAM  06/09/2020  . FOOT EXAM  06/30/2020  . Hepatitis C Screening  Completed  . PNA vac Low Risk Adult  Completed       Assessment  This is a routine wellness examination for [place].  Health Maintenance: Due or Overdue There are no preventive care reminders to display for this patient.  [place] does not need a referral for Community Assistance: Care Management:   no Social Work:    no Prescription Assistance:  no Nutrition/Diabetes Education:  no   Plan:  Personalized Goals Goals Addressed            This Visit's Progress   . DIET - EAT MORE FRUITS AND VEGETABLES      . Exercise 150 min/wk Moderate Activity        Personalized Health Maintenance & Screening Recommendations  Tetanus   Lung Cancer Screening Recommended: no (Low Dose CT Chest recommended if Age 45-80 years, 30 pack-year currently smoking OR have quit w/in past 15 years) Hepatitis C Screening recommended: no HIV Screening recommended: no  Advanced Directives: Written information was not prepared per patient's request.  Referrals & Orders No orders of the defined types were placed in this encounter.   Follow-up Plan . Follow-up with [place], FNP as planned . Schedule for a tetanus booster at next visit    I have personally reviewed and noted the following in the patient's chart:   . Medical and social history . Use of alcohol, tobacco or illicit drugs  . Current medications and supplements . Functional ability and status . Nutritional status . Physical activity . Advanced directives . List of other physicians . Hospitalizations, surgeries, and ER visits in previous 12 months . Vitals . Screenings to include cognitive, depression, and falls . Referrals and appointments  In addition, I have reviewed and discussed with  [place] certain preventive protocols, quality metrics, and best practice recommendations. A written personalized care plan for preventive services as well as general preventive health recommendations is available and can be mailed to the patient at his request.  Rolena Infante LPN [id number]

## 2019-09-07 ENCOUNTER — Telehealth: Payer: Self-pay | Admitting: Family Medicine

## 2019-09-07 DIAGNOSIS — L309 Dermatitis, unspecified: Secondary | ICD-10-CM

## 2019-09-07 NOTE — Telephone Encounter (Signed)
Patient had phone visit with you on 08/17/2019 because of rash on his neck.  He was prescribed Nystatin-Triamcinolone cream and the rash improved.  Patient stopped the cream about a week ago.  When he woke up this morning the rash was back around the neck.  We cannot bring patient in with this rash because of Covid precautions.  Is there another cream he should try?  [facility] [place].

## 2019-09-08 MED ORDER — NYSTATIN-TRIAMCINOLONE 100000-0.1 UNIT/GM-% EX OINT: 1.0000 "application " | TOPICAL_OINTMENT | Freq: Two times a day (BID) | CUTANEOUS | 3 refills | Status: DC

## 2019-09-08 NOTE — Telephone Encounter (Signed)
Patient aware and verbalized understanding he is asking for a refill of the cream. Please send to pharmacy on file.

## 2019-09-08 NOTE — Telephone Encounter (Signed)
I would think that the rash is likely related to something like eczema or yeast which has returned, if the cream cleared at the first time that I would go ahead and use the same cream again.  There would not necessarily be anything different but sometimes these things do return in the same locations, make sure he uses it for at least 5 days in the area after the rash has cleared to make sure that it is fully eradicated

## 2019-10-01 ENCOUNTER — Other Ambulatory Visit: Payer: Self-pay | Admitting: Family Medicine

## 2019-10-01 DIAGNOSIS — E782 Mixed hyperlipidemia: Secondary | ICD-10-CM

## 2019-10-06 ENCOUNTER — Encounter: Payer: Self-pay | Admitting: *Deleted

## 2019-11-14 ENCOUNTER — Other Ambulatory Visit: Payer: Self-pay | Admitting: Family Medicine

## 2019-12-03 ENCOUNTER — Telehealth: Payer: Self-pay | Admitting: Family Medicine

## 2019-12-03 ENCOUNTER — Other Ambulatory Visit: Payer: Self-pay | Admitting: *Deleted

## 2019-12-03 MED ORDER — CONTOUR NEXT TEST VI STRP: ORAL_STRIP | 3 refills | Status: DC

## 2019-12-03 NOTE — Telephone Encounter (Signed)
  Prescription Request  12/03/2019  What is the name of the medication or equipment? glucose blood (CONTOUR NEXT TEST) test strip  Have you contacted your pharmacy to request a refill? (if applicable) yes--fax  Which [facility] you [facility] [place]   Patient notified that their request is being sent to the clinical staff for review and that they should receive a response within 2 business days.

## 2019-12-03 NOTE — Telephone Encounter (Signed)
Patient aware, script is ready. 

## 2019-12-27 ENCOUNTER — Other Ambulatory Visit: Payer: Self-pay | Admitting: Family Medicine

## 2019-12-27 DIAGNOSIS — E782 Mixed hyperlipidemia: Secondary | ICD-10-CM

## 2020-01-06 ENCOUNTER — Telehealth: Payer: Self-pay | Admitting: Pharmacist

## 2020-01-06 ENCOUNTER — Other Ambulatory Visit: Payer: Self-pay

## 2020-01-06 ENCOUNTER — Ambulatory Visit (INDEPENDENT_AMBULATORY_CARE_PROVIDER_SITE_OTHER): Payer: Medicare Other | Admitting: Family Medicine

## 2020-01-06 ENCOUNTER — Encounter: Payer: Self-pay | Admitting: Family Medicine

## 2020-01-06 VITALS — BP 142/78 | HR 103 | Temp 96.9°F | Resp 20 | Ht 71.5 in | Wt 202.0 lb

## 2020-01-06 DIAGNOSIS — E782 Mixed hyperlipidemia: Secondary | ICD-10-CM | POA: Diagnosis not present

## 2020-01-06 DIAGNOSIS — Z0000 Encounter for general adult medical examination without abnormal findings: Secondary | ICD-10-CM

## 2020-01-06 DIAGNOSIS — R251 Tremor, unspecified: Secondary | ICD-10-CM

## 2020-01-06 DIAGNOSIS — I10 Essential (primary) hypertension: Secondary | ICD-10-CM

## 2020-01-06 DIAGNOSIS — I152 Hypertension secondary to endocrine disorders: Secondary | ICD-10-CM

## 2020-01-06 DIAGNOSIS — E1159 Type 2 diabetes mellitus with other circulatory complications: Secondary | ICD-10-CM

## 2020-01-06 DIAGNOSIS — E109 Type 1 diabetes mellitus without complications: Secondary | ICD-10-CM

## 2020-01-06 DIAGNOSIS — K219 Gastro-esophageal reflux disease without esophagitis: Secondary | ICD-10-CM | POA: Diagnosis not present

## 2020-01-06 DIAGNOSIS — L309 Dermatitis, unspecified: Secondary | ICD-10-CM

## 2020-01-06 LAB — BAYER DCA HB A1C WAIVED: HB A1C (BAYER DCA - WAIVED): 7.4 % — ABNORMAL HIGH (ref <7.0)

## 2020-01-06 MED ORDER — NYSTATIN-TRIAMCINOLONE 100000-0.1 UNIT/GM-% EX OINT: 1.0000 "application " | TOPICAL_OINTMENT | Freq: Two times a day (BID) | CUTANEOUS | 3 refills | Status: DC

## 2020-01-06 MED ORDER — FREESTYLE LIBRE 2 SENSOR MISC: 11 refills | Status: DC

## 2020-01-06 NOTE — Telephone Encounter (Signed)
New start libre--will start paperwork for CGM RX [facility] to see [facility]  F/u to see PharmD in 2 weeks  Having lows in the AM (FBG) Running higher after meals Libre2 (2 wk samples) placed on R arm to gauge patterns

## 2020-01-06 NOTE — Progress Notes (Addendum)
Assessment & Plan:  1. Type 1 diabetes mellitus without complications (HCC) Lab Results  Component Value Date   HGBA1C 7.4 (H) 01/06/2020   HGBA1C 6.9 07/01/2019   HGBA1C 7.0 (H) 01/28/2019  - Diabetes is not at goal of A1c < 7. - Medications: continue current medications, our clinical pharmacist, Almyra Free, will call patient to discuss next steps with his diabetes - Patient is currently taking a statin. Patient is taking an ACE-inhibitor/ARB.  - Last foot exam: 07/01/2019 - Last diabetic eye exam: 06/10/2019 - CBC with Differential/Platelet - CMP14+EGFR - Bayer DCA Hb A1c Waived -patient is testing blood sugar 4-6 times daily -patient is injecting insulin at least 3 times daily -patient is making adjustments to insulin based on blood sugar readings -patient would benefit from CGM system (I.e. Libre or Dexcom)   2. Hypertension associated with diabetes ([place]) - Well controlled on current regimen.  - CBC with Differential/Platelet - CMP14+EGFR - Lipid panel  3. Mixed hyperlipidemia - Well controlled on current regimen.  - CBC with Differential/Platelet - Lipid panel  4. Gastroesophageal reflux disease, unspecified whether esophagitis present - Well controlled on current regimen.   5. Tremor - Patient declined a referral to neurology.   6. Dermatitis - Patient declined a referral to dermatology.  - nystatin-triamcinolone ointment (MYCOLOG); Apply 1 application topically 2 (two) times daily.  Dispense: 60 g; Refill: 3  7. Healthcare maintenance - Encouraged patient to complete Cologuard and return. He will call back with dates of COVID-19 vaccines (Moderna).    Return in about 3 months (around 04/07/2020) for DM.  Hendricks Limes, MSN, APRN, FNP-C [facility]  Subjective:    Patient ID: [place], male    DOB: [birth year], 69 y.o.   MRN: [id number]  Patient Care Team: [place], FNP as PCP - General (Family Medicine)   Chief Complaint:   Chief Complaint  Patient presents with  . Medical Management of Chronic Issues    6 mo   . Diabetes  . Hypertension  . Gastroesophageal Reflux    HPI: Tyjai Matuszak is a 69 y.o. male presenting on 01/06/2020 for Medical Management of Chronic Issues (6 mo ), Diabetes, Hypertension, and Gastroesophageal Reflux  Diabetes: Patient presents for follow up of diabetes. Current symptoms include: fasting hyperglycemia (as high as 200) and hypoglycemia (as low as 60). Symptoms have gradually worsened. Known diabetic complications: none. Medication compliance: yes - patient did decrease his levemir from 48 units to 47 units BID due to hypoglycemia. Current diet: in general, an "unhealthy" diet. Current exercise: none.  Patient reports Is he  on ACE inhibitor or angiotensin II receptor blocker? Yes. Is he on a statin? Yes.   Lab Results  Component Value Date   HGBA1C 7.4 (H) 01/06/2020   HGBA1C 6.9 07/01/2019   HGBA1C 7.0 (H) 01/28/2019   Lab Results  Component Value Date   LDLCALC 92 01/06/2020   CREATININE 0.76 01/06/2020     New complaints: Patient c/o a tremor that has been going on for several months. It occurs with action. He states his hand-writing is "going to crap" and his grip is getting weaker.   Patient also reports the rash on his neck is still coming and going. The ointment he was given (nystatin-triamcinolone) helps it after 1-2 weeks of use, but then he runs out of it and the rash returns one month later. He states the redness never fades. Denies any itching or burning currently.    Social history:  Relevant past medical, surgical, family and social history reviewed and updated as indicated. Interim medical history since our last visit reviewed.  Allergies and medications reviewed and updated.  DATA REVIEWED: CHART IN EPIC  ROS: Negative unless specifically indicated above in HPI.    Current Outpatient Medications:  .  Aspirin-Salicylamide-Caffeine (BC HEADACHE  POWDER PO), Take by mouth., Disp: , Rfl:  .  atorvastatin (LIPITOR) 40 MG tablet, TAKE 1 TABLET BY MOUTH EVERY DAY, Disp: 90 tablet, Rfl: 0 .  doxycycline (VIBRAMYCIN) 100 MG capsule, TAKE 1 CAPSULE BY MOUTH EVERY DAY, Disp: 90 capsule, Rfl: 1 .  enalapril (VASOTEC) 10 MG tablet, Take 1 tablet (10 mg total) by mouth daily., Disp: 90 tablet, Rfl: 1 .  glucose blood (CONTOUR NEXT TEST) test strip, Test 3 times a day. Dx. E11.9, Disp: 100 each, Rfl: 3 .  insulin aspart (NOVOLOG FLEXPEN) 100 UNIT/ML FlexPen, INJECT 30 UNITS DOSE INTO THE SKIN 15 (FIFTEEN) MINUTES BEFORE MEALS., Disp: 90 mL, Rfl: 0 .  insulin detemir (LEVEMIR FLEXTOUCH) 100 UNIT/ML FlexPen, Inject 48 Units into the skin 2 (two) times daily., Disp: 90 mL, Rfl: 0 .  Insulin Pen Needle (BD PEN NEEDLE MICRO U/F) 32G X 6 MM MISC, Use with injection 6 times daily Dx E10.9, Disp: 600 each, Rfl: 3 .  nystatin-triamcinolone ointment (MYCOLOG), Apply 1 application topically 2 (two) times daily., Disp: 60 g, Rfl: 3 .  omeprazole (PRILOSEC) 20 MG capsule, Take 1 capsule (20 mg total) by mouth daily., Disp: 90 capsule, Rfl: 1 .  OneTouch Delica Lancets 74Q MISC, 1 Device by Other route 2 (two) times daily. Dx:e11.9, Disp: , Rfl:    Allergies  Allergen Reactions  . Codeine Nausea And Vomiting   Past Medical History:  Diagnosis Date  . Diabetes mellitus without complication ([place])   . GERD (gastroesophageal reflux disease)   . Hypertension associated with diabetes ([place]) 02/29/2016  . Rosacea 01/28/2019    Past Surgical History:  Procedure Laterality Date  . broken nose      Social History   Socioeconomic History  . Marital status: Divorced    Spouse name: Not on file  . Number of children: 1  . Years of education: 15  . Highest education level: Some college, no degree  Occupational History  . Not on file  Tobacco Use  . Smoking status: Never Smoker  . Smokeless tobacco: Never Used  Vaping Use  . Vaping Use: Never used  Substance  and Sexual Activity  . Alcohol use: Yes    Comment: two shots daily  . Drug use: Not Currently  . Sexual activity: Not on file  Other Topics Concern  . Not on file  Social History Narrative  . Not on file   Social Determinants of Health   Financial Resource Strain:   . Difficulty of Paying Living Expenses:   Food Insecurity:   . Worried About Charity fundraiser in the Last Year:   . Arboriculturist in the Last Year:   Transportation Needs:   . Film/video editor (Medical):   Marland Kitchen Lack of Transportation (Non-Medical):   Physical Activity:   . Days of Exercise per Week:   . Minutes of Exercise per Session:   Stress:   . Feeling of Stress :   Social Connections:   . Frequency of Communication with Friends and Family:   . Frequency of Social Gatherings with Friends and Family:   . Attends Religious Services:   . Active  Member of Clubs or Organizations:   . Attends Archivist Meetings:   Marland Kitchen Marital Status:   Intimate Partner Violence:   . Fear of Current or Ex-Partner:   . Emotionally Abused:   [place] Physically Abused:   . Sexually Abused:         Objective:    BP (!) 142/78 (BP Location: Right Arm, Cuff Size: Large)   Pulse (!) 103   Temp (!) 96.9 F (36.1 C)   Resp 20   Ht 5' 11.5" (1.816 m)   Wt 202 lb (91.6 kg)   SpO2 96%   BMI 27.78 kg/m   Wt Readings from Last 3 Encounters:  01/06/20 202 lb (91.6 kg)  07/01/19 198 lb 3.2 oz (89.9 kg)  01/28/19 196 lb 3.2 oz (89 kg)    Physical Exam Vitals reviewed.  Constitutional:      General: He is not in acute distress.    Appearance: Normal appearance. He is overweight. He is not ill-appearing, toxic-appearing or diaphoretic.  HENT:     Head: Normocephalic and atraumatic.  Eyes:     General: No scleral icterus.       Right eye: No discharge.        Left eye: No discharge.     Conjunctiva/sclera: Conjunctivae normal.  Cardiovascular:     Rate and Rhythm: Normal rate and regular rhythm.     Heart  sounds: Normal heart sounds. No murmur heard.  No friction rub. No gallop.   Pulmonary:     Effort: Pulmonary effort is normal. No respiratory distress.     Breath sounds: Normal breath sounds. No stridor. No wheezing, rhonchi or rales.  Musculoskeletal:        General: Normal range of motion.     Cervical back: Normal range of motion.  Skin:    General: Skin is warm and dry.     Findings: Erythema (around neck and down chest a short way) present.  Neurological:     Mental Status: He is alert and oriented to person, place, and time. Mental status is at baseline.     Motor: Tremor (with action) present.  Psychiatric:        Mood and Affect: Mood normal.        Behavior: Behavior normal.        Thought Content: Thought content normal.        Judgment: Judgment normal.     No results found for: TSH No results found for: WBC, HGB, HCT, MCV, PLT Lab Results  Component Value Date   NA 142 07/01/2019   K 5.2 07/01/2019   CO2 25 07/01/2019   GLUCOSE 217 (H) 07/01/2019   BUN 6 (L) 07/01/2019   CREATININE 0.67 (L) 07/01/2019   BILITOT 1.2 07/01/2019   ALKPHOS 115 07/01/2019   AST 47 (H) 07/01/2019   ALT 29 07/01/2019   PROT 7.4 07/01/2019   ALBUMIN 4.3 07/01/2019   CALCIUM 9.4 07/01/2019   Lab Results  Component Value Date   CHOL 216 (H) 07/01/2019   Lab Results  Component Value Date   HDL 89 07/01/2019   Lab Results  Component Value Date   LDLCALC 114 (H) 07/01/2019   Lab Results  Component Value Date   TRIG 76 07/01/2019   Lab Results  Component Value Date   CHOLHDL 2.4 07/01/2019   Lab Results  Component Value Date   HGBA1C 7.4 (H) 01/06/2020

## 2020-01-07 LAB — CBC WITH DIFFERENTIAL/PLATELET
Basophils Absolute: 0.1 10*3/uL (ref 0.0–0.2)
Basos: 1 %
EOS (ABSOLUTE): 0.2 10*3/uL (ref 0.0–0.4)
Eos: 2 %
Hematocrit: 41.3 % (ref 37.5–51.0)
Hemoglobin: 14.2 g/dL (ref 13.0–17.7)
Immature Grans (Abs): 0 10*3/uL (ref 0.0–0.1)
Immature Granulocytes: 1 %
Lymphocytes Absolute: 2 10*3/uL (ref 0.7–3.1)
Lymphs: 23 %
MCH: 33.3 pg — ABNORMAL HIGH (ref 26.6–33.0)
MCHC: 34.4 g/dL (ref 31.5–35.7)
MCV: 97 fL (ref 79–97)
Monocytes Absolute: 0.5 10*3/uL (ref 0.1–0.9)
Monocytes: 6 %
Neutrophils Absolute: 5.9 10*3/uL (ref 1.4–7.0)
Neutrophils: 67 %
Platelets: 184 10*3/uL (ref 150–450)
RBC: 4.27 x10E6/uL (ref 4.14–5.80)
RDW: 11.3 % — ABNORMAL LOW (ref 11.6–15.4)
WBC: 8.7 10*3/uL (ref 3.4–10.8)

## 2020-01-07 LAB — LIPID PANEL
Chol/HDL Ratio: 2.5 ratio (ref 0.0–5.0)
Cholesterol, Total: 173 mg/dL (ref 100–199)
HDL: 68 mg/dL (ref >39)
LDL Chol Calc (NIH): 92 mg/dL (ref 0–99)
Triglycerides: 68 mg/dL (ref 0–149)
VLDL Cholesterol Cal: 13 mg/dL (ref 5–40)

## 2020-01-07 LAB — CMP14+EGFR
ALT: 34 IU/L (ref 0–44)
AST: 40 IU/L (ref 0–40)
Albumin/Globulin Ratio: 1.2 (ref 1.2–2.2)
Albumin: 3.8 g/dL (ref 3.8–4.8)
Alkaline Phosphatase: 140 IU/L — ABNORMAL HIGH (ref 48–121)
BUN/Creatinine Ratio: 14 (ref 10–24)
BUN: 11 mg/dL (ref 8–27)
Bilirubin Total: 0.9 mg/dL (ref 0.0–1.2)
CO2: 24 mmol/L (ref 20–29)
Calcium: 9.3 mg/dL (ref 8.6–10.2)
Chloride: 99 mmol/L (ref 96–106)
Creatinine, Ser: 0.76 mg/dL (ref 0.76–1.27)
GFR calc Af Amer: 108 mL/min/{1.73_m2} (ref >59)
GFR calc non Af Amer: 94 mL/min/{1.73_m2} (ref >59)
Globulin, Total: 3.2 g/dL (ref 1.5–4.5)
Glucose: 242 mg/dL — ABNORMAL HIGH (ref 65–99)
Potassium: 4.5 mmol/L (ref 3.5–5.2)
Sodium: 138 mmol/L (ref 134–144)
Total Protein: 7 g/dL (ref 6.0–8.5)

## 2020-01-10 ENCOUNTER — Other Ambulatory Visit: Payer: Self-pay | Admitting: Family Medicine

## 2020-01-14 NOTE — Telephone Encounter (Signed)
Pt checking status on refill. Also states it was mentioned his insulin may be changed from the levemir and he is wanting to see if he was to do that.

## 2020-01-14 NOTE — Telephone Encounter (Signed)
If there will be adjustments to medications this will be at his follow-up visit with Almyra Free next week.

## 2020-01-18 ENCOUNTER — Encounter: Payer: Self-pay | Admitting: Family Medicine

## 2020-01-18 DIAGNOSIS — R251 Tremor, unspecified: Secondary | ICD-10-CM | POA: Insufficient documentation

## 2020-01-20 ENCOUNTER — Ambulatory Visit (INDEPENDENT_AMBULATORY_CARE_PROVIDER_SITE_OTHER): Payer: Medicare Other | Admitting: Pharmacist

## 2020-01-20 ENCOUNTER — Other Ambulatory Visit: Payer: Self-pay

## 2020-01-20 DIAGNOSIS — E119 Type 2 diabetes mellitus without complications: Secondary | ICD-10-CM | POA: Diagnosis not present

## 2020-01-20 MED ORDER — FREESTYLE LIBRE 2 SENSOR MISC: 11 refills | Status: DC

## 2020-01-20 MED ORDER — FREESTYLE LIBRE 2 READER DEVI: 0 refills | Status: DC

## 2020-01-20 NOTE — Progress Notes (Signed)
    01/20/2020 Name: [place] MRN: [id number] DOB: 1951-04-12   S:  88 yoM presents for diabetes evaluation, education, and management Patient was referred and last seen by Primary Care Provider on   Insurance coverage/medication affordability: [place] medicare  Patient reports adherence with medications. . Current diabetes medications include: levemir, humalog . Current hypertension medications include: enalapril Goal 130/80 . Current hyperlipidemia medications include: atorvastatin   Patient denies hypoglycemic events.   Patient reported dietary habits: Eats 2 meals/day  . Breakfast: doesn't eat breakfast . Often goes out for lunch  The patient is asked to make an attempt to improve diet and exercise patterns to aid in medical management of this problem. Discussed meal planning options and Plate method for healthy eating . Avoid sugary drinks and desserts . Incorporate balanced protein, non starchy veggies, 1 serving of carbohydrate with each meal . Increase water intake . Increase physical activity as able  Patient-reported exercise habits: n/a   O:  Lab Results  Component Value Date   HGBA1C 7.4 (H) 01/06/2020    Lipid Panel     Component Value Date/Time   CHOL 173 01/06/2020 1412   TRIG 68 01/06/2020 1412   HDL 68 01/06/2020 1412   CHOLHDL 2.5 01/06/2020 1412   LDLCALC 92 01/06/2020 1412    Home fasting blood sugars: 150-170s  2 hour post-meal/random blood sugars: up to 200.   A/P:  Diabetes T2DM, A1c currently 7.4%. Patient is able to verbalize appropriate hypoglycemia management plan. Patient is adherent with medication. Control is suboptimal due to diet.  -Switch from Levemir to [place] for better all day control   Patient to inject 45 units sq twice daily  -Continue humalog with meals  -Will attempt to get CGM covered under medicare part B  -Extensively discussed pathophysiology of diabetes, recommended lifestyle interventions, dietary  effects on blood sugar control  -Counseled on s/sx of and management of hypoglycemia  -Next A1C anticipated 6 months.   Written patient instructions provided.  Total time in face to face counseling 30 minutes.   Follow up PCP Clinic Visit in February.   Regina Eck, PharmD, BCPS Clinical Pharmacist, [place]  II Phone [phone]

## 2020-01-27 ENCOUNTER — Encounter: Payer: Self-pay | Admitting: Pharmacist

## 2020-01-27 ENCOUNTER — Telehealth: Payer: Self-pay | Admitting: Family Medicine

## 2020-01-27 MED ORDER — FREESTYLE LIBRE 2 READER DEVI
0 refills | Status: DC
Start: 2020-01-27 — End: 2020-01-27

## 2020-01-27 MED ORDER — TRESIBA FLEXTOUCH 200 UNIT/ML ~~LOC~~ SOPN: 45.0000 [IU] | PEN_INJECTOR | Freq: Two times a day (BID) | SUBCUTANEOUS | 3 refills | Status: DC

## 2020-01-27 MED ORDER — FREESTYLE LIBRE 2 SENSOR MISC: 11 refills | Status: DC

## 2020-01-27 MED ORDER — FREESTYLE LIBRE 2 SENSOR MISC
11 refills | Status: DC
Start: 2020-01-27 — End: 2020-07-22

## 2020-01-27 MED ORDER — FREESTYLE LIBRE 2 READER DEVI
0 refills | Status: DC
Start: 2020-01-27 — End: 2023-11-21

## 2020-01-27 MED ORDER — BD PEN NEEDLE MICRO U/F 32G X 6 MM MISC: 3 refills | Status: DC

## 2020-01-27 NOTE — Telephone Encounter (Signed)
Tresiba U-100 samples left in fridge for patient to pick up [id number] EXP 2/23 #2 pens  RX sent to PCP to cosign, this will replace levemir  Attempting to get patient CGM

## 2020-01-29 ENCOUNTER — Telehealth: Payer: Medicare Other

## 2020-01-29 ENCOUNTER — Other Ambulatory Visit: Payer: Self-pay

## 2020-01-31 ENCOUNTER — Other Ambulatory Visit: Payer: Self-pay | Admitting: Family Medicine

## 2020-01-31 DIAGNOSIS — E782 Mixed hyperlipidemia: Secondary | ICD-10-CM

## 2020-02-02 ENCOUNTER — Telehealth: Payer: Self-pay | Admitting: Family Medicine

## 2020-02-02 MED ORDER — DOXYCYCLINE HYCLATE 100 MG PO CAPS: ORAL_CAPSULE | ORAL | 1 refills | Status: DC

## 2020-02-02 NOTE — Telephone Encounter (Signed)
Pt calling to check status on diabetes medication being approved through insurance.

## 2020-02-02 NOTE — Telephone Encounter (Signed)
Diabetes or doxycycline? I refilled doxycycline - not sure why it was refused.

## 2020-02-03 ENCOUNTER — Telehealth: Payer: Self-pay | Admitting: Family Medicine

## 2020-02-03 NOTE — Telephone Encounter (Signed)
Pt called to let Almyra Free know that its going to take about a week to get a new Glucose Monitoring System. Wants to know if Almyra Free has anything he can use until then? Wants julie to call him back.

## 2020-02-03 NOTE — Telephone Encounter (Signed)
Call returned to patient.

## 2020-02-04 ENCOUNTER — Other Ambulatory Visit: Payer: Self-pay | Admitting: Family Medicine

## 2020-03-07 ENCOUNTER — Ambulatory Visit (INDEPENDENT_AMBULATORY_CARE_PROVIDER_SITE_OTHER): Payer: Medicare Other | Admitting: Family

## 2020-03-07 ENCOUNTER — Encounter: Payer: Self-pay | Admitting: Family

## 2020-03-07 DIAGNOSIS — R251 Tremor, unspecified: Secondary | ICD-10-CM

## 2020-03-07 DIAGNOSIS — R29898 Other symptoms and signs involving the musculoskeletal system: Secondary | ICD-10-CM | POA: Diagnosis not present

## 2020-03-07 NOTE — Progress Notes (Signed)
   Virtual Visit via telephone Note Due to COVID-19 pandemic this visit was conducted virtually. This visit type was conducted due to national recommendations for restrictions regarding the COVID-19 Pandemic (e.g. social distancing, sheltering in place) in an effort to limit this patient's exposure and mitigate transmission in our community. All issues noted in this document were discussed and addressed.  A physical exam was not performed with this format.  I connected with Dustin Graham on 03/07/20 at 2:29 pm  by telephone and verified that I am speaking with the correct person using two identifiers. Dustin Graham is currently located at work and no one is currently with him during visit. The provider, Evelina Dun, FNP is located in their office at time of visit.  I discussed the limitations, risks, security and privacy concerns of performing an evaluation and management service by telephone and the availability of in person appointments. I also discussed with the patient that there may be a patient responsible charge related to this service. The patient expressed understanding and agreed to proceed.   History and Present Illness:  HPI  Pt calls the office with bilateral leg weakness. He reports he was getting out of his truck on Saturday and noticed he had leg weakness, but both of his legs "gave out" on him and he had to crawl to the sidewalk. Continues to have weakness and soreness, but able to walk. He reports the soreness is a 7 out 10.   Denies any injury, pain in back.   He also reports new onset of tremors over the last few month. He states he has these tremors occur while writing and doing activities.   Denies any changes in gait, fever, cough, s/s infection, dizziness, or headache.   Review of Systems  Musculoskeletal: Negative for neck pain.  Neurological: Positive for tremors and weakness. Negative for dizziness, tingling, sensory change, speech change, focal weakness,  seizures and headaches.  All other systems reviewed and are negative.    Observations/Objective: No SOB or distress noted  Assessment and Plan: Dustin Graham comes in today with chief complaint of No chief complaint on file.   Diagnosis and orders addressed: 1. Weakness of both lower extremities - Ambulatory referral to Neurology  2. Tremor - Ambulatory referral to Neurology  Given new onset of tremor with bilateral leg weakness, I will do referral to Neurologists. Pt states at first he had resting tremor, but after discussing I believe it is only with activities of writing or doing things.  Strengthening exercises encouraged    I discussed the assessment and treatment plan with the patient. The patient was provided an opportunity to ask questions and all were answered. The patient agreed with the plan and demonstrated an understanding of the instructions.   The patient was advised to call back or seek an in-person evaluation if the symptoms worsen or if the condition fails to improve as anticipated.  The above assessment and management plan was discussed with the patient. The patient verbalized understanding of and has agreed to the management plan. Patient is aware to call the clinic if symptoms persist or worsen. Patient is aware when to return to the clinic for a follow-up visit. Patient educated on when it is appropriate to go to the emergency department.   Time call ended:  2:52 pm   I provided 21 minutes of non-face-to-face time during this encounter.    Evelina Dun, FNP

## 2020-03-21 ENCOUNTER — Other Ambulatory Visit: Payer: Self-pay | Admitting: Family Medicine

## 2020-04-15 ENCOUNTER — Other Ambulatory Visit: Payer: Self-pay | Admitting: Family Medicine

## 2020-04-15 DIAGNOSIS — E109 Type 1 diabetes mellitus without complications: Secondary | ICD-10-CM

## 2020-04-18 ENCOUNTER — Telehealth: Payer: Self-pay

## 2020-04-18 MED ORDER — TRESIBA FLEXTOUCH 200 UNIT/ML ~~LOC~~ SOPN: 46.0000 [IU] | PEN_INJECTOR | Freq: Two times a day (BID) | SUBCUTANEOUS | 0 refills | Status: DC

## 2020-04-18 NOTE — Telephone Encounter (Signed)
Pt confirmed with me that he is no longer on the Levemir, but was needing refill on the [place] Refill sent to pharmacy

## 2020-04-25 ENCOUNTER — Telehealth: Payer: Self-pay

## 2020-04-25 ENCOUNTER — Other Ambulatory Visit: Payer: Self-pay | Admitting: Family Medicine

## 2020-04-25 DIAGNOSIS — E109 Type 1 diabetes mellitus without complications: Secondary | ICD-10-CM

## 2020-04-25 NOTE — Telephone Encounter (Signed)
Appointment scheduled.

## 2020-04-25 NOTE — Telephone Encounter (Signed)
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 Pt needs appt ASAP because he was only given 10 tablets of the Hydrocodone and ran out yesterday.   Please advise and call patient.  [phone]

## 2020-04-27 ENCOUNTER — Other Ambulatory Visit: Payer: Self-pay | Admitting: Family Medicine

## 2020-04-27 ENCOUNTER — Ambulatory Visit: Payer: Medicare Other | Admitting: Family Medicine

## 2020-04-27 DIAGNOSIS — E1169 Type 2 diabetes mellitus with other specified complication: Secondary | ICD-10-CM

## 2020-04-27 DIAGNOSIS — E1159 Type 2 diabetes mellitus with other circulatory complications: Secondary | ICD-10-CM

## 2020-04-28 ENCOUNTER — Other Ambulatory Visit: Payer: Self-pay | Admitting: Family Medicine

## 2020-04-28 DIAGNOSIS — E1169 Type 2 diabetes mellitus with other specified complication: Secondary | ICD-10-CM

## 2020-04-28 DIAGNOSIS — E1159 Type 2 diabetes mellitus with other circulatory complications: Secondary | ICD-10-CM

## 2020-04-29 ENCOUNTER — Encounter: Payer: Self-pay | Admitting: Family Medicine

## 2020-04-29 ENCOUNTER — Telehealth: Payer: Self-pay

## 2020-04-29 MED ORDER — OMEPRAZOLE 20 MG PO CPDR
DELAYED_RELEASE_CAPSULE | ORAL | 0 refills | Status: DC
Start: 2020-04-29 — End: 2020-11-02

## 2020-04-29 NOTE — Telephone Encounter (Signed)
  Prescription Request  04/29/2020  What is the name of the medication or equipment? Insulin/Novelog & Omeprazole  Have you contacted your pharmacy to request a refill? (if applicable) yes  Which pharmacy would you like this sent to? [facility]   Patient notified that their request is being sent to the clinical staff for review and that they should receive a response within 2 business days.    Joyce's pt.  His Insulin was changed.  He was taking Levimer.  He needs his Insulin.  Please call him today.

## 2020-04-29 NOTE — Telephone Encounter (Signed)
LMOVM refilled Omeprazole & Novolog was sent in this morning from request electronically from pharmacy

## 2020-05-05 DIAGNOSIS — S22080G Wedge compression fracture of T11-T12 vertebra, subsequent encounter for fracture with delayed healing: Secondary | ICD-10-CM | POA: Insufficient documentation

## 2020-07-06 ENCOUNTER — Ambulatory Visit: Payer: Medicare Other | Admitting: Family Medicine

## 2020-07-07 ENCOUNTER — Encounter: Payer: Self-pay | Admitting: Family Medicine

## 2020-07-15 ENCOUNTER — Telehealth: Payer: Self-pay

## 2020-07-15 NOTE — Telephone Encounter (Signed)
Pt called stating that he needs another order for his Freestyle Libre 2 called in to [facility].

## 2020-07-18 NOTE — Telephone Encounter (Signed)
Called [facility], pt needs an appt, OV notes are a little greater than 6 mos He has an appt for this Friday 07/22/20, they will fax an order sheet to have filled out. Pt will run out before then. Sample sensor left at front desk

## 2020-07-22 ENCOUNTER — Other Ambulatory Visit: Payer: Self-pay

## 2020-07-22 ENCOUNTER — Ambulatory Visit (INDEPENDENT_AMBULATORY_CARE_PROVIDER_SITE_OTHER): Payer: Medicare Other | Admitting: Family Medicine

## 2020-07-22 ENCOUNTER — Encounter: Payer: Self-pay | Admitting: Family Medicine

## 2020-07-22 VITALS — BP 144/86 | HR 90 | Temp 98.2°F | Ht 71.5 in | Wt 194.4 lb

## 2020-07-22 DIAGNOSIS — E782 Mixed hyperlipidemia: Secondary | ICD-10-CM | POA: Diagnosis not present

## 2020-07-22 DIAGNOSIS — Z1211 Encounter for screening for malignant neoplasm of colon: Secondary | ICD-10-CM

## 2020-07-22 DIAGNOSIS — E1159 Type 2 diabetes mellitus with other circulatory complications: Secondary | ICD-10-CM

## 2020-07-22 DIAGNOSIS — I152 Hypertension secondary to endocrine disorders: Secondary | ICD-10-CM

## 2020-07-22 DIAGNOSIS — E1065 Type 1 diabetes mellitus with hyperglycemia: Secondary | ICD-10-CM | POA: Insufficient documentation

## 2020-07-22 LAB — BAYER DCA HB A1C WAIVED: HB A1C (BAYER DCA - WAIVED): 6.5 % (ref <7.0)

## 2020-07-22 MED ORDER — NOVOLOG FLEXPEN 100 UNIT/ML ~~LOC~~ SOPN: 0.0000 [IU] | PEN_INJECTOR | Freq: Three times a day (TID) | SUBCUTANEOUS | 0 refills | Status: DC

## 2020-07-22 MED ORDER — TRESIBA FLEXTOUCH 200 UNIT/ML ~~LOC~~ SOPN
20.0000 [IU] | PEN_INJECTOR | Freq: Every day | SUBCUTANEOUS | 0 refills | Status: DC
Start: 2020-07-22 — End: 2020-09-04

## 2020-07-22 MED ORDER — ENALAPRIL MALEATE 20 MG PO TABS: 20.0000 mg | ORAL_TABLET | Freq: Every day | ORAL | 1 refills | Status: DC

## 2020-07-22 MED ORDER — ATORVASTATIN CALCIUM 40 MG PO TABS: 40.0000 mg | ORAL_TABLET | Freq: Every day | ORAL | 1 refills | Status: DC

## 2020-07-22 MED ORDER — FREESTYLE LIBRE 2 SENSOR MISC: 11 refills | Status: DC

## 2020-07-22 NOTE — Progress Notes (Signed)
Assessment & Plan:  1. Type 2 diabetes mellitus with other circulatory complications (HCC) D5H 6.5 today which is decreased from 7.4 - Diabetes is at goal of A1c < 7. - Medications: continue current medications - Home glucose monitoring: continue with freestyle [place] system. We do not have any libre 2 sensors in the office to give him until his come in. -patient is testing blood sugar 4-6 times daily -patient is injecting insulin at least 3 times daily -patient is making adjustments to insulin based on blood sugar readings -patient is benefiting from CGM system - Patient is currently taking a statin. Patient is taking an ACE-inhibitor/ARB.  - Instruction/counseling given: reminded to get eye exam and discussed foot care  Diabetes Health Maintenance Due  Topic Date Due  . OPHTHALMOLOGY EXAM  06/09/2020  . HEMOGLOBIN A1C  07/08/2020  . FOOT EXAM  07/22/2021    - Bayer DCA Hb A1c Waived - enalapril (VASOTEC) 20 MG tablet; Take 1 tablet (20 mg total) by mouth daily.  Dispense: 90 tablet; Refill: 1 - atorvastatin (LIPITOR) 40 MG tablet; Take 1 tablet (40 mg total) by mouth daily.  Dispense: 90 tablet; Refill: 1 - Continuous Blood Gluc Sensor (FREESTYLE LIBRE 2 SENSOR) MISC; Use to test blood sugar up to 6 times daily. DX: E11.9  Dispense: 2 each; Refill: 11 - insulin aspart (NOVOLOG FLEXPEN) 100 UNIT/ML FlexPen; Inject 0-15 Units into the skin 3 (three) times daily before meals. Per sliding scale.  Dispense: 90 mL; Refill: 0 - insulin degludec (TRESIBA FLEXTOUCH) 200 UNIT/ML FlexTouch Pen; Inject 20 Units into the skin daily.  Dispense: 21 mL; Refill: 0  2. Hypertension associated with diabetes ([place]) Uncontrolled. Increased enalapril from 10 mg to 20 mg once daily.  - CMP14+EGFR - CBC with Differential/Platelet - enalapril (VASOTEC) 20 MG tablet; Take 1 tablet (20 mg total) by mouth daily.  Dispense: 90 tablet; Refill: 1  3. Mixed hyperlipidemia Labs to assess. - Lipid panel -  atorvastatin (LIPITOR) 40 MG tablet; Take 1 tablet (40 mg total) by mouth daily.  Dispense: 90 tablet; Refill: 1  4. Colon cancer screening Reminded to complete cologuard that he has at home.    Return in about 6 weeks (around 09/02/2020) for HTN.  Hendricks Limes, MSN, APRN, FNP-C [facility]  Subjective:    Patient ID: Dustin Graham, male    DOB: 11-Jul-1950, 70 y.o.   MRN: [id number]  Patient Care Team: Loman Brooklyn, FNP as PCP - General (Family Medicine) Lavera Guise, [facility] (Pharmacist)   Chief Complaint:  Chief Complaint  Patient presents with  . Diabetes    Check up of chronic medical conditions  . Hypertension    HPI: Dustin Graham is a 70 y.o. male presenting on 07/22/2020 for Diabetes (Check up of chronic medical conditions) and Hypertension  Diabetes: Patient presents for follow up of diabetes. Current symptoms include: hyperglycemia. Known diabetic complications: none. Medication compliance: patient reports he has not been able to eat as well since he had back surgery so he has had to change his insulin doses to prevent lows. He is currently taking [place] 20 units once daily and using his Novolog with meals as a sliding scale (up to 15 units). Current diet: not eating very much. Home blood sugar records: patient checks glucose level "every time he thinks about it" which includes prior to each meal and at bedtime for dosing of sliding scale Novolog. His 7 day average is 156. His 14 day average is  161. 30 day average is 162. 90 day average is 162. Is he  on ACE inhibitor or angiotensin II receptor blocker? Yes. Is he on a statin? Yes. He is in need of more sensors for his [place]. He states his last one he is currently wearing expires in 4 hours.   Hypertension: patient does not check BP at home.   New complaints: None  Social history:  Relevant past medical, surgical, family and social history reviewed and updated as indicated. Interim medical  history since our last visit reviewed.  Allergies and medications reviewed and updated.  DATA REVIEWED: CHART IN EPIC  ROS: Negative unless specifically indicated above in HPI.    Current Outpatient Medications:  .  Aspirin-Salicylamide-Caffeine (BC HEADACHE POWDER PO), Take by mouth., Disp: , Rfl:  .  atorvastatin (LIPITOR) 40 MG tablet, TAKE 1 TABLET BY MOUTH EVERY DAY, Disp: 90 tablet, Rfl: 1 .  Continuous Blood Gluc Receiver (FREESTYLE LIBRE 2 READER) DEVI, Use to test blood sugar up to 6 times daily. DX; E11.9, Disp: 1 each, Rfl: 0 .  Continuous Blood Gluc Sensor (FREESTYLE LIBRE 2 SENSOR) MISC, Use to test blood sugar up to 6 times daily. DX: E11.9, Disp: 2 each, Rfl: 11 .  enalapril (VASOTEC) 10 MG tablet, TAKE 1 TABLET BY MOUTH EVERY DAY, Disp: 90 tablet, Rfl: 1 .  glucose blood (CONTOUR NEXT TEST) test strip, Test 3 times a day. Dx. E11.9, Disp: 100 each, Rfl: 3 .  insulin aspart (NOVOLOG FLEXPEN) 100 UNIT/ML FlexPen, Inject 30 Units into the skin 3 (three) times daily before meals., Disp: 90 mL, Rfl: 0 .  insulin degludec (TRESIBA FLEXTOUCH) 200 UNIT/ML FlexTouch Pen, Inject 46 Units into the skin 2 (two) times daily., Disp: 42 mL, Rfl: 0 .  Insulin Pen Needle (BD PEN NEEDLE MICRO U/F) 32G X 6 MM MISC, Use with injection 6 times daily Dx E10.9, Disp: 600 each, Rfl: 3 .  omeprazole (PRILOSEC) 20 MG capsule, TAKE 1 CAPSULE BY MOUTH EVERY DAY, Disp: 90 capsule, Rfl: 0 .  OneTouch Delica Lancets 54M MISC, 1 Device by Other route 2 (two) times daily. Dx:e11.9, Disp: , Rfl:    Allergies  Allergen Reactions  . Codeine Nausea And Vomiting   Past Medical History:  Diagnosis Date  . Diabetes mellitus without complication ([place])   . GERD (gastroesophageal reflux disease)   . Hypertension associated with diabetes ([place]) 02/29/2016  . Rosacea 01/28/2019    Past Surgical History:  Procedure Laterality Date  . BACK SURGERY    . broken nose      Social History   Socioeconomic History  .  Marital status: Divorced    Spouse name: Not on file  . Number of children: 1  . Years of education: 83  . Highest education level: Some college, no degree  Occupational History  . Not on file  Tobacco Use  . Smoking status: Never Smoker  . Smokeless tobacco: Never Used  Vaping Use  . Vaping Use: Never used  Substance and Sexual Activity  . Alcohol use: Yes    Comment: two shots daily  . Drug use: Not Currently  . Sexual activity: Not on file  Other Topics Concern  . Not on file  Social History Narrative  . Not on file   Social Determinants of Health   Financial Resource Strain: Not on file  Food Insecurity: Not on file  Transportation Needs: Not on file  Physical Activity: Not on file  Stress: Not on file  Social Connections: Not on file  Intimate Partner Violence: Not on file        Objective:    BP (!) 144/86   Pulse 90   Temp 98.2 F (36.8 C) (Temporal)   Ht 5' 11.5" (1.816 m)   Wt 194 lb 6.4 oz (88.2 kg)   SpO2 94%   BMI 26.74 kg/m   Wt Readings from Last 3 Encounters:  07/22/20 194 lb 6.4 oz (88.2 kg)  01/06/20 202 lb (91.6 kg)  07/01/19 198 lb 3.2 oz (89.9 kg)    Physical Exam Vitals reviewed.  Constitutional:      General: He is not in acute distress.    Appearance: Normal appearance. He is overweight. He is not ill-appearing, toxic-appearing or diaphoretic.  HENT:     Head: Normocephalic and atraumatic.  Eyes:     General: No scleral icterus.       Right eye: No discharge.        Left eye: No discharge.     Conjunctiva/sclera: Conjunctivae normal.  Cardiovascular:     Rate and Rhythm: Normal rate and regular rhythm.     Heart sounds: Normal heart sounds. No murmur heard. No friction rub. No gallop.   Pulmonary:     Effort: Pulmonary effort is normal. No respiratory distress.     Breath sounds: Normal breath sounds. No stridor. No wheezing, rhonchi or rales.  Musculoskeletal:        General: Normal range of motion.     Cervical back:  Normal range of motion.  Skin:    General: Skin is warm and dry.  Neurological:     Mental Status: He is alert and oriented to person, place, and time. Mental status is at baseline.  Psychiatric:        Mood and Affect: Mood normal.        Behavior: Behavior normal.        Thought Content: Thought content normal.        Judgment: Judgment normal.    Diabetic Foot Exam - Simple   Simple Foot Form Diabetic Foot exam was performed with the following findings: Yes 07/22/2020  9:57 AM  Visual Inspection No deformities, no ulcerations, no other skin breakdown bilaterally: Yes Sensation Testing Intact to touch and monofilament testing bilaterally: Yes Pulse Check Posterior Tibialis and Dorsalis pulse intact bilaterally: Yes Comments     No results found for: TSH Lab Results  Component Value Date   WBC 8.7 01/06/2020   HGB 14.2 01/06/2020   HCT 41.3 01/06/2020   MCV 97 01/06/2020   PLT 184 01/06/2020   Lab Results  Component Value Date   NA 138 01/06/2020   K 4.5 01/06/2020   CO2 24 01/06/2020   GLUCOSE 242 (H) 01/06/2020   BUN 11 01/06/2020   CREATININE 0.76 01/06/2020   BILITOT 0.9 01/06/2020   ALKPHOS 140 (H) 01/06/2020   AST 40 01/06/2020   ALT 34 01/06/2020   PROT 7.0 01/06/2020   ALBUMIN 3.8 01/06/2020   CALCIUM 9.3 01/06/2020   Lab Results  Component Value Date   CHOL 173 01/06/2020   Lab Results  Component Value Date   HDL 68 01/06/2020   Lab Results  Component Value Date   LDLCALC 92 01/06/2020   Lab Results  Component Value Date   TRIG 68 01/06/2020   Lab Results  Component Value Date   CHOLHDL 2.5 01/06/2020   Lab Results  Component Value Date   HGBA1C 7.4 (H) 01/06/2020

## 2020-07-22 NOTE — Patient Instructions (Addendum)
Schedule your eye exam.  Complete Cologuard and get it sent back in.   A1c 6.5  Increase enalapril from 10 mg to 20 mg once daily.

## 2020-07-23 LAB — CMP14+EGFR
ALT: 35 IU/L (ref 0–44)
AST: 54 IU/L — ABNORMAL HIGH (ref 0–40)
Albumin/Globulin Ratio: 1.4 (ref 1.2–2.2)
Albumin: 4 g/dL (ref 3.8–4.8)
Alkaline Phosphatase: 135 IU/L — ABNORMAL HIGH (ref 44–121)
BUN/Creatinine Ratio: 11 (ref 10–24)
BUN: 7 mg/dL — ABNORMAL LOW (ref 8–27)
Bilirubin Total: 0.9 mg/dL (ref 0.0–1.2)
CO2: 22 mmol/L (ref 20–29)
Calcium: 9.5 mg/dL (ref 8.6–10.2)
Chloride: 97 mmol/L (ref 96–106)
Creatinine, Ser: 0.61 mg/dL — ABNORMAL LOW (ref 0.76–1.27)
GFR calc Af Amer: 118 mL/min/{1.73_m2} (ref >59)
GFR calc non Af Amer: 102 mL/min/{1.73_m2} (ref >59)
Globulin, Total: 2.9 g/dL (ref 1.5–4.5)
Glucose: 232 mg/dL — ABNORMAL HIGH (ref 65–99)
Potassium: 4.6 mmol/L (ref 3.5–5.2)
Sodium: 136 mmol/L (ref 134–144)
Total Protein: 6.9 g/dL (ref 6.0–8.5)

## 2020-07-23 LAB — LIPID PANEL
Chol/HDL Ratio: 1.7 ratio (ref 0.0–5.0)
Cholesterol, Total: 180 mg/dL (ref 100–199)
HDL: 105 mg/dL (ref >39)
LDL Chol Calc (NIH): 61 mg/dL (ref 0–99)
Triglycerides: 76 mg/dL (ref 0–149)
VLDL Cholesterol Cal: 14 mg/dL (ref 5–40)

## 2020-07-23 LAB — CBC WITH DIFFERENTIAL/PLATELET
Basophils Absolute: 0.1 10*3/uL (ref 0.0–0.2)
Basos: 1 %
EOS (ABSOLUTE): 0.2 10*3/uL (ref 0.0–0.4)
Eos: 3 %
Hematocrit: 39.5 % (ref 37.5–51.0)
Hemoglobin: 13.7 g/dL (ref 13.0–17.7)
Immature Grans (Abs): 0 10*3/uL (ref 0.0–0.1)
Immature Granulocytes: 0 %
Lymphocytes Absolute: 2.1 10*3/uL (ref 0.7–3.1)
Lymphs: 35 %
MCH: 34.9 pg — ABNORMAL HIGH (ref 26.6–33.0)
MCHC: 34.7 g/dL (ref 31.5–35.7)
MCV: 101 fL — ABNORMAL HIGH (ref 79–97)
Monocytes Absolute: 0.5 10*3/uL (ref 0.1–0.9)
Monocytes: 8 %
Neutrophils Absolute: 3.3 10*3/uL (ref 1.4–7.0)
Neutrophils: 53 %
Platelets: 171 10*3/uL (ref 150–450)
RBC: 3.93 x10E6/uL — ABNORMAL LOW (ref 4.14–5.80)
RDW: 12.6 % (ref 11.6–15.4)
WBC: 6.1 10*3/uL (ref 3.4–10.8)

## 2020-07-26 ENCOUNTER — Other Ambulatory Visit: Payer: Self-pay | Admitting: Family Medicine

## 2020-07-27 ENCOUNTER — Ambulatory Visit (INDEPENDENT_AMBULATORY_CARE_PROVIDER_SITE_OTHER): Payer: Medicare Other

## 2020-07-27 ENCOUNTER — Other Ambulatory Visit: Payer: Self-pay

## 2020-07-27 DIAGNOSIS — Z23 Encounter for immunization: Secondary | ICD-10-CM | POA: Diagnosis not present

## 2020-08-06 ENCOUNTER — Other Ambulatory Visit: Payer: Self-pay | Admitting: Family Medicine

## 2020-08-06 DIAGNOSIS — E1159 Type 2 diabetes mellitus with other circulatory complications: Secondary | ICD-10-CM

## 2020-09-02 ENCOUNTER — Ambulatory Visit (INDEPENDENT_AMBULATORY_CARE_PROVIDER_SITE_OTHER): Payer: Medicare Other

## 2020-09-02 ENCOUNTER — Other Ambulatory Visit: Payer: Self-pay | Admitting: Family Medicine

## 2020-09-02 VITALS — Ht 72.0 in | Wt 195.0 lb

## 2020-09-02 DIAGNOSIS — Z0000 Encounter for general adult medical examination without abnormal findings: Secondary | ICD-10-CM

## 2020-09-02 DIAGNOSIS — E1159 Type 2 diabetes mellitus with other circulatory complications: Secondary | ICD-10-CM

## 2020-09-02 NOTE — Progress Notes (Signed)
Subjective:   Dustin Graham is a 70 y.o. male who presents for Medicare Annual/Subsequent preventive examination.  Virtual Visit via Telephone Note  I connected with  Dustin Graham on 09/02/20 at  2:00 PM EDT by telephone and verified that I am speaking with the correct person using two identifiers.  Location: Patient: Home Provider: [facility] Persons participating in the virtual visit: patient/Nurse Health Advisor   I discussed the limitations, risks, security and privacy concerns of performing an evaluation and management service by telephone and the availability of in person appointments. The patient expressed understanding and agreed to proceed.  Interactive audio and video telecommunications were attempted between this nurse and patient, however failed, due to patient having technical difficulties OR patient did not have access to video capability.  We continued and completed visit with audio only.  Some vital signs may be absent or patient reported.   Dustin Graham E Dustin Contino, LPN   Review of Systems     Cardiac Risk Factors include: advanced age (>44men, >84 women);diabetes mellitus;dyslipidemia;sedentary lifestyle;hypertension;male gender     Objective:    Today's Vitals   09/02/20 1335 09/02/20 1336  Weight:  195 lb (88.5 kg)  Height:  6' (1.829 m)  PainSc: 3     Body mass index is 26.45 kg/m.  Advanced Directives 09/02/2020 09/02/2019  Does Patient Have a Medical Advance Directive? Yes No  Type of Paramedic of [place];Living will -  Copy of [place] in Chart? No - copy requested -  Would patient like information on creating a medical advance directive? - No - Patient declined    Current Medications (verified) Outpatient Encounter Medications as of 09/02/2020  Medication Sig  . Aspirin-Salicylamide-Caffeine (BC HEADACHE POWDER PO) Take by mouth.  Marland Kitchen atorvastatin (LIPITOR) 40 MG tablet Take 1 tablet (40 mg total) by mouth daily.  .  Continuous Blood Gluc Receiver (FREESTYLE LIBRE 2 READER) DEVI Use to test blood sugar up to 6 times daily. DX; E11.9  . Continuous Blood Gluc Graham (FREESTYLE LIBRE 2 Graham) MISC Use to test blood sugar up to 6 times daily. DX: E11.9  . enalapril (VASOTEC) 20 MG tablet Take 1 tablet (20 mg total) by mouth daily.  . insulin aspart (NOVOLOG FLEXPEN) 100 UNIT/ML FlexPen Inject 30 Units into the skin 3 (three) times daily with meals.  . insulin degludec (TRESIBA FLEXTOUCH) 200 UNIT/ML FlexTouch Pen Inject 20 Units into the skin daily.  Marland Kitchen omeprazole (PRILOSEC) 20 MG capsule TAKE 1 CAPSULE BY MOUTH EVERY DAY  . glucose blood (CONTOUR NEXT TEST) test strip Test 3 times a day. Dx. E11.9 (Patient not taking: Reported on 09/02/2020)  . Insulin Pen Needle (BD PEN NEEDLE MICRO U/F) 32G X 6 MM MISC Use with injection 6 times daily Dx E10.9 (Patient not taking: Reported on 09/02/2020)  . OneTouch Delica Lancets 65H MISC 1 Device by Other route 2 (two) times daily. Dx:e11.9 (Patient not taking: Reported on 09/02/2020)   No facility-administered encounter medications on file as of 09/02/2020.    Allergies (verified) Codeine   History: Past Medical History:  Diagnosis Date  . Diabetes mellitus without complication ([place])   . GERD (gastroesophageal reflux disease)   . Hypertension associated with diabetes ([place]) 02/29/2016  . Rosacea 01/28/2019   Past Surgical History:  Procedure Laterality Date  . BACK SURGERY    . broken nose     Family History  Problem Relation Age of Onset  . Diabetes Paternal Aunt    Social  History   Socioeconomic History  . Marital status: Divorced    Spouse name: Not on file  . Number of children: 1  . Years of education: 66  . 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 Smoking status: Never Smoker  . Smokeless tobacco: Never Used  Vaping Use  . Vaping Use: Never used  Substance and Sexual  Activity  . Alcohol use: Yes    Alcohol/week: 3.0 - 4.0 standard drinks    Types: 3 - 4 Standard drinks or equivalent per week  . Drug use: Not Currently  . Sexual activity: Not on file  Other Topics Concern  . Not on file  Social History Narrative   Lives alone   Social Determinants of Health   Financial Resource Strain: Low Risk   . Difficulty of Paying Living Expenses: Not hard at all  Food Insecurity: No Food Insecurity  . Worried About Charity fundraiser in the Last Year: Never true  . Ran Out of Food in the Last Year: Never true  Transportation Needs: No Transportation Needs  . Lack of Transportation (Medical): No  . Lack of Transportation (Non-Medical): No  Physical Activity: Inactive  . Days of Exercise per Week: 0 days  . Minutes of Exercise per Session: 0 min  Stress: Stress Concern Present  . Feeling of Stress : To some extent  Social Connections: Moderately Integrated  . Frequency of Communication with Friends and Family: More than three times a week  . Frequency of Social Gatherings with Friends and Family: More than three times a week  . Attends Religious Services: 1 to 4 times per year  . Active Member of Clubs or Organizations: Yes  . Attends Archivist Meetings: 1 to 4 times per year  . Marital Status: Divorced    Tobacco Counseling Counseling given: Not Answered   Clinical Intake:  Pre-visit preparation completed: Yes  Pain : 0-10 Pain Score: 3  Pain Type: Chronic pain Pain Location: Back Pain Onset: More than a month ago Pain Frequency: Intermittent     BMI - recorded: 26.45 Nutritional Status: BMI 25 -29 Overweight Nutritional Risks: None Diabetes: Yes CBG done?: No Did pt. bring in CBG monitor from home?: No  How often do you need to have someone help you when you read instructions, pamphlets, or other written materials from your doctor or pharmacy?: 1 - Never  Nutrition Risk Assessment:  Has the patient had any N/V/D  within the last 2 months?  Yes - diarrhea one day last week - unknown cause Does the patient have any non-healing wounds?  No  Has the patient had any unintentional weight loss or weight gain?  No   Diabetes:  Is the patient diabetic?  Yes  If diabetic, was a CBG obtained today?  No  Did the patient bring in their glucometer from home?  No  How often do you monitor your CBG's? Has FreeStyle Libre.   Financial Strains and Diabetes Management:  Are you having any financial strains with the device, your supplies or your medication? No .  Does the patient want to be seen by Chronic Care Management for management of their diabetes?  No  Would the patient like to be referred to a Nutritionist or for Diabetic Management?  No   Diabetic Exams:  Diabetic Eye Exam: Completed 06/10/2019. Overdue for diabetic eye exam. Pt has been advised about the  importance in completing this exam. He will make appt soon.  Diabetic Foot Exam: Completed 07/22/2020. Pt has been advised about the importance in completing this exam. Pt is scheduled for diabetic foot exam on 09/14/2020.    Interpreter Needed?: No  Information entered by :: Foster Frericks, LPN   Activities of Daily Living In your present state of health, do you have any difficulty performing the following activities: 09/02/2020  Hearing? Y  Vision? N  Difficulty concentrating or making decisions? N  Walking or climbing stairs? Y  Dressing or bathing? N  Doing errands, shopping? N  Preparing Food and eating ? N  Using the Toilet? N  In the past six months, have you accidently leaked urine? N  Do you have problems with loss of bowel control? N  Managing your Medications? N  Managing your Finances? N  Housekeeping or managing your Housekeeping? N  Some recent data might be hidden    Patient Care Team: Loman Brooklyn, FNP as PCP - General (Family Medicine) Lavera Guise, [facility] (Pharmacist)  Indicate any recent Medical Services you may have  received from other than [facility] providers in the past year (date may be approximate).     Assessment:   This is a routine wellness examination for Vraj.  Hearing/Vision screen  Hearing Screening   125Hz  250Hz  500Hz  1000Hz  2000Hz  3000Hz  4000Hz  6000Hz  8000Hz   Right ear:           Left ear:           Comments: Moderate hearing loss - declines hearing aids  Vision Screening Comments: Annual eye exams with [facility] in [place] issues and exercise activities discussed: Current Exercise Habits: The patient does not participate in regular exercise at present, Exercise limited by: orthopedic condition(s)  Goals    . DIET - EAT MORE FRUITS AND VEGETABLES    . Exercise 150 min/wk Moderate Activity    . LIFESTYLE - DECREASE FALLS RISK     Since back surgery, having some mobility issues - plans to get shower chair, elevated toilet seat and grab bars in bathroom.      Depression Screen PHQ 2/9 Scores 09/02/2020 07/22/2020 01/06/2020 07/01/2019 01/28/2019  PHQ - 2 Score 0 0 0 0 0    Fall Risk Fall Risk  09/02/2020 07/22/2020 01/06/2020 09/02/2019 07/01/2019  Falls in the past year? 0 0 0 1 1  Number falls in past yr: 0 - - 0 0  Comment - - - - -  Injury with Fall? 0 - - 0 1  Comment - - - - back pain  Risk for fall due to : Impaired mobility - - - -  Follow up Falls prevention discussed - - - -    FALL RISK PREVENTION PERTAINING TO THE HOME:  Any stairs in or around the home? Yes  If so, are there any without handrails? No  Home free of loose throw rugs in walkways, pet beds, electrical cords, etc? Yes  Adequate lighting in your home to reduce risk of falls? Yes   ASSISTIVE DEVICES UTILIZED TO PREVENT FALLS:  Life alert? No  Use of a cane, walker or w/c? Yes  Grab bars in the bathroom? No  Shower chair or bench in shower? No  Elevated toilet seat or a handicapped toilet? No   TIMED UP AND GO:  Was the test performed? No . Telephonic visit.  Cognitive Function:     6CIT Screen  09/02/2019  What Year? 0 points  What month? 0 points  What time? 0 points  Count back from 20 0 points  Months in reverse 0 points  Repeat phrase 0 points  Total Score 0    Immunizations Immunization History  Administered Date(s) Administered  . Fluad Quad(high Dose 65+) 07/27/2020  . Influenza Split 03/03/2014  . Influenza, High Dose Seasonal PF 01/21/2019  . Influenza-Unspecified 01/23/2019  . Moderna Sars-Covid-2 Vaccination 07/02/2019, 07/31/2019, 02/15/2020  . Pneumococcal Conjugate-13 09/26/2016  . Pneumococcal Polysaccharide-23 07/23/2018    TDAP status: Due, Education has been provided regarding the importance of this vaccine. Advised may receive this vaccine at local pharmacy or Health Dept. Aware to provide a copy of the vaccination record if obtained from local pharmacy or Health Dept. Verbalized acceptance and understanding.  Flu Vaccine status: Up to date  Pneumococcal vaccine status: Up to date  Covid-19 vaccine status: Completed vaccines  Qualifies for Shingles Vaccine? Yes   Zostavax completed No   Shingrix Completed?: No.    Education has been provided regarding the importance of this vaccine. Patient has been advised to call insurance company to determine out of pocket expense if they have not yet received this vaccine. Advised may also receive vaccine at local pharmacy or Health Dept. Verbalized acceptance and understanding.  Screening Tests Health Maintenance  Topic Date Due  . Fecal DNA (Cologuard)  Never done  . OPHTHALMOLOGY EXAM  06/09/2020  . TETANUS/TDAP  07/22/2021 (Originally 05/20/1970)  . INFLUENZA VACCINE  12/26/2020  . HEMOGLOBIN A1C  01/19/2021  . FOOT EXAM  07/22/2021  . COVID-19 Vaccine  Completed  . Hepatitis C Screening  Completed  . PNA vac Low Risk Adult  Completed  . HPV VACCINES  Aged Out    Health Maintenance  Health Maintenance Due  Topic Date Due  . Fecal DNA (Cologuard)  Never done  . OPHTHALMOLOGY EXAM  06/09/2020     Cologuard was ordered; pt keeps putting it off - was advised to return it asap, if expired, they can send him a new one.  Lung Cancer Screening: (Low Dose CT Chest recommended if Age 74-80 years, 30 pack-year currently smoking OR have quit w/in 15years.) does not qualify.   Additional Screening:  Hepatitis C Screening: does qualify; Completed 01/30/2017  Vision Screening: Recommended annual ophthalmology exams for early detection of glaucoma and other disorders of the eye. Is the patient up to date with their annual eye exam?  No  Who is the provider or what is the name of the office in which the patient attends annual eye exams? [facility] in [place] If pt is not established with a provider, would they like to be referred to a provider to establish care? No .   Dental Screening: Recommended annual dental exams for proper oral hygiene  Community Resource Referral / Chronic Care Management: CRR required this visit?  No   CCM required this visit?  No      Plan:     I have personally reviewed and noted the following in the patient's chart:   . Medical and social history . Use of alcohol, tobacco or illicit drugs  . Current medications and supplements . Functional ability and status . Nutritional status . Physical activity . Advanced directives . List of other physicians . Hospitalizations, surgeries, and ER visits in previous 12 months . Vitals . Screenings to include cognitive, depression, and falls . Referrals and appointments  In addition, I have reviewed and discussed with patient certain preventive protocols, quality metrics, and best  practice recommendations. A written personalized care plan for preventive services as well as general preventive health recommendations were provided to patient.     Sandrea Hammond, LPN   1/0/0712   Nurse Notes: None

## 2020-09-02 NOTE — Telephone Encounter (Signed)
Take a look at your last office visit note please.  In the notes it says patient is taking 20 units of Tresiba daily but in the outpatient meds it states 46 units twice a day.  Was unsure and wanted to clarify before incorrect prescription sent in.

## 2020-09-02 NOTE — Patient Instructions (Signed)
Dustin Graham , Thank you for taking time to come for your Medicare Wellness Visit. I appreciate your ongoing commitment to your health goals. Please review the following plan we discussed and let me know if I can assist you in the future.   Screening recommendations/referrals: Colonoscopy: Due Recommended yearly ophthalmology/optometry visit for glaucoma screening and checkup Recommended yearly dental visit for hygiene and checkup  Vaccinations: Influenza vaccine: Done 07/27/2020 - Repeat in Fall 2022 Pneumococcal vaccine: Done 09/26/2016 & 07/23/2018 Tdap vaccine: Due Shingles vaccine: Shingrix discussed. Please contact your pharmacy for coverage information.    Covid-19: Complete: 07/02/19, 07/31/19, & 02/15/20  Advanced directives: Please bring a copy of your health care power of attorney and living will to the office to be added to your chart at your convenience.  Conditions/risks identified: Try to increase physical activity as tolerated; continue working on fall prevention measures.  Next appointment: Follow up in one year for your annual wellness visit.   Preventive Care 70 Years and Older, Male  Preventive care refers to lifestyle choices and visits with your health care provider that can promote health and wellness. What does preventive care include?  A yearly physical exam. This is also called an annual well check.  Dental exams once or twice a year.  Routine eye exams. Ask your health care provider how often you should have your eyes checked.  Personal lifestyle choices, including:  Daily care of your teeth and gums.  Regular physical activity.  Eating a healthy diet.  Avoiding tobacco and drug use.  Limiting alcohol use.  Practicing safe sex.  Taking low doses of aspirin every day.  Taking vitamin and mineral supplements as recommended by your health care provider. What happens during an annual well check? The services and screenings done by your health care provider  during your annual well check will depend on your age, overall health, lifestyle risk factors, and family history of disease. Counseling  Your health care provider may ask you questions about your:  Alcohol use.  Tobacco use.  Drug use.  Emotional well-being.  Home and relationship well-being.  Sexual activity.  Eating habits.  History of falls.  Memory and ability to understand (cognition).  Work and work Statistician. Screening  You may have the following tests or measurements:  Height, weight, and BMI.  Blood pressure.  Lipid and cholesterol levels. These may be checked every 5 years, or more frequently if you are over 70 years old.  Skin check.  Lung cancer screening. You may have this screening every year starting at age 70 if you have a 30-pack-year history of smoking and currently smoke or have quit within the past 15 years.  Fecal occult blood test (FOBT) of the stool. You may have this test every year starting at age 70.  Flexible sigmoidoscopy or colonoscopy. You may have a sigmoidoscopy every 5 years or a colonoscopy every 10 years starting at age 70.  Prostate cancer screening. Recommendations will vary depending on your family history and other risks.  Hepatitis C blood test.  Hepatitis B blood test.  Sexually transmitted disease (STD) testing.  Diabetes screening. This is done by checking your blood sugar (glucose) after you have not eaten for a while (fasting). You may have this done every 1-3 years.  Abdominal aortic aneurysm (AAA) screening. You may need this if you are a current or former smoker.  Osteoporosis. You may be screened starting at age 70 if you are at high risk. Talk with your health  care provider about your test results, treatment options, and if necessary, the need for more tests. Vaccines  Your health care provider may recommend certain vaccines, such as:  Influenza vaccine. This is recommended every year.  Tetanus,  diphtheria, and acellular pertussis (Tdap, Td) vaccine. You may need a Td booster every 10 years.  Zoster vaccine. You may need this after age 70.  Pneumococcal 13-valent conjugate (PCV13) vaccine. One dose is recommended after age 70.  Pneumococcal polysaccharide (PPSV23) vaccine. One dose is recommended after age 70. Talk to your health care provider about which screenings and vaccines you need and how often you need them. This information is not intended to replace advice given to you by your health care provider. Make sure you discuss any questions you have with your health care provider. Document Released: 06/10/2015 Document Revised: 02/01/2016 Document Reviewed: 03/15/2015 Elsevier Interactive Patient Education  2017 [place] Prevention in the Home Falls can cause injuries. They can happen to people of all ages. There are many things you can do to make your home safe and to help prevent falls. What can I do on the outside of my home?  Regularly fix the edges of walkways and driveways and fix any cracks.  Remove anything that might make you trip as you walk through a door, such as a raised step or threshold.  Trim any bushes or trees on the path to your home.  Use bright outdoor lighting.  Clear any walking paths of anything that might make someone trip, such as rocks or tools.  Regularly check to see if handrails are loose or broken. Make sure that both sides of any steps have handrails.  Any raised decks and porches should have guardrails on the edges.  Have any leaves, snow, or ice cleared regularly.  Use sand or salt on walking paths during winter.  Clean up any spills in your garage right away. This includes oil or grease spills. What can I do in the bathroom?  Use night lights.  Install grab bars by the toilet and in the tub and shower. Do not use towel bars as grab bars.  Use non-skid mats or decals in the tub or shower.  If you need to sit down in  the shower, use a plastic, non-slip stool.  Keep the floor dry. Clean up any water that spills on the floor as soon as it happens.  Remove soap buildup in the tub or shower regularly.  Attach bath mats securely with double-sided non-slip rug tape.  Do not have throw rugs and other things on the floor that can make you trip. What can I do in the bedroom?  Use night lights.  Make sure that you have a light by your bed that is easy to reach.  Do not use any sheets or blankets that are too big for your bed. They should not hang down onto the floor.  Have a firm chair that has side arms. You can use this for support while you get dressed.  Do not have throw rugs and other things on the floor that can make you trip. What can I do in the kitchen?  Clean up any spills right away.  Avoid walking on wet floors.  Keep items that you use a lot in easy-to-reach places.  If you need to reach something above you, use a strong step stool that has a grab bar.  Keep electrical cords out of the way.  Do not use floor  polish or wax that makes floors slippery. If you must use wax, use non-skid floor wax.  Do not have throw rugs and other things on the floor that can make you trip. What can I do with my stairs?  Do not leave any items on the stairs.  Make sure that there are handrails on both sides of the stairs and use them. Fix handrails that are broken or loose. Make sure that handrails are as long as the stairways.  Check any carpeting to make sure that it is firmly attached to the stairs. Fix any carpet that is loose or worn.  Avoid having throw rugs at the top or bottom of the stairs. If you do have throw rugs, attach them to the floor with carpet tape.  Make sure that you have a light switch at the top of the stairs and the bottom of the stairs. If you do not have them, ask someone to add them for you. What else can I do to help prevent falls?  Wear shoes that:  Do not have high  heels.  Have rubber bottoms.  Are comfortable and fit you well.  Are closed at the toe. Do not wear sandals.  If you use a stepladder:  Make sure that it is fully opened. Do not climb a closed stepladder.  Make sure that both sides of the stepladder are locked into place.  Ask someone to hold it for you, if possible.  Clearly mark and make sure that you can see:  Any grab bars or handrails.  First and last steps.  Where the edge of each step is.  Use tools that help you move around (mobility aids) if they are needed. These include:  Canes.  Walkers.  Scooters.  Crutches.  Turn on the lights when you go into a dark area. Replace any light bulbs as soon as they burn out.  Set up your furniture so you have a clear path. Avoid moving your furniture around.  If any of your floors are uneven, fix them.  If there are any pets around you, be aware of where they are.  Review your medicines with your doctor. Some medicines can make you feel dizzy. This can increase your chance of falling. Ask your doctor what other things that you can do to help prevent falls. This information is not intended to replace advice given to you by your health care provider. Make sure you discuss any questions you have with your health care provider. Document Released: 03/10/2009 Document Revised: 10/20/2015 Document Reviewed: 06/18/2014 Elsevier Interactive Patient Education  2017 [employer].

## 2020-09-14 ENCOUNTER — Ambulatory Visit (INDEPENDENT_AMBULATORY_CARE_PROVIDER_SITE_OTHER): Payer: Medicare Other | Admitting: Family Medicine

## 2020-09-14 ENCOUNTER — Encounter: Payer: Self-pay | Admitting: Family Medicine

## 2020-09-14 ENCOUNTER — Other Ambulatory Visit: Payer: Self-pay

## 2020-09-14 VITALS — BP 130/78 | HR 101 | Temp 98.4°F | Ht 73.0 in | Wt 189.2 lb

## 2020-09-14 DIAGNOSIS — E1159 Type 2 diabetes mellitus with other circulatory complications: Secondary | ICD-10-CM

## 2020-09-14 DIAGNOSIS — R600 Localized edema: Secondary | ICD-10-CM | POA: Diagnosis not present

## 2020-09-14 DIAGNOSIS — R251 Tremor, unspecified: Secondary | ICD-10-CM | POA: Diagnosis not present

## 2020-09-14 DIAGNOSIS — I152 Hypertension secondary to endocrine disorders: Secondary | ICD-10-CM

## 2020-09-14 NOTE — Progress Notes (Signed)
Assessment & Plan:  1. Hypertension associated with diabetes ([place]) Well controlled on current regimen.   2. Edema of left lower extremity Advised to decrease salt intake, elevate extremities when sitting, and wear compression hose. - BMP8+EGFR  3. Tremor - Ambulatory referral to Neurology   Return in about 3 months (around 12/14/2020) for follow-up of chronic medication conditions.  Hendricks Limes, MSN, APRN, FNP-C [facility]  Subjective:    Patient ID: Dustin Graham, male    DOB: [birth year], 70 y.o.   MRN: [id number]  Patient Care Team: Loman Brooklyn, FNP as PCP - General (Family Medicine) Lavera Guise, [facility] (Pharmacist)   Chief Complaint:  Chief Complaint  Patient presents with  . Hypertension    6 week follow up  . Leg Swelling    Patient states that he has been having left leg swelling since Sunday.   . hands shaking    Patient states that he has been having bilateral hand shaking that has been going on since Christmas.  Patient states it is now worse and he has trouble witting.     HPI: Dustin Graham is a 70 y.o. male presenting on 09/14/2020 for Hypertension (6 week follow up), Leg Swelling (Patient states that he has been having left leg swelling since Sunday. ), and hands shaking (Patient states that he has been having bilateral hand shaking that has been going on since Christmas.  Patient states it is now worse and he has trouble witting. )  Hypertension: At our last visit enalapril was increased from 10 mg to 20 mg once daily.  Patient does not check his blood pressure at home.  He occasionally add salt to his food.  He is not exercising.  New complaints: Patient reports his left foot and ankle swells in the evenings for the past few days.  When he wakes up in the morning it is not swollen at all.  Denies any pain or erythema.  Patient reports a tremor that started last year.  It occurs with rest and action.  Social  history:  Relevant past medical, surgical, family and social history reviewed and updated as indicated. Interim medical history since our last visit reviewed.  Allergies and medications reviewed and updated.  DATA REVIEWED: CHART IN EPIC  ROS: Negative unless specifically indicated above in HPI.    Current Outpatient Medications:  .  Aspirin-Salicylamide-Caffeine (BC HEADACHE POWDER PO), Take by mouth., Disp: , Rfl:  .  atorvastatin (LIPITOR) 40 MG tablet, Take 1 tablet (40 mg total) by mouth daily., Disp: 90 tablet, Rfl: 1 .  Continuous Blood Gluc Receiver (FREESTYLE LIBRE 2 READER) DEVI, Use to test blood sugar up to 6 times daily. DX; E11.9, Disp: 1 each, Rfl: 0 .  Continuous Blood Gluc Sensor (FREESTYLE LIBRE 2 SENSOR) MISC, Use to test blood sugar up to 6 times daily. DX: E11.9, Disp: 2 each, Rfl: 11 .  enalapril (VASOTEC) 20 MG tablet, Take 1 tablet (20 mg total) by mouth daily., Disp: 90 tablet, Rfl: 1 .  glucose blood (CONTOUR NEXT TEST) test strip, Test 3 times a day. Dx. E11.9, Disp: 100 each, Rfl: 3 .  insulin aspart (NOVOLOG FLEXPEN) 100 UNIT/ML FlexPen, Inject 30 Units into the skin 3 (three) times daily with meals., Disp: 30 mL, Rfl: 2 .  insulin degludec (TRESIBA FLEXTOUCH) 200 UNIT/ML FlexTouch Pen, Inject 20 Units into the skin daily., Disp: 9 mL, Rfl: 1 .  Insulin Pen Needle (BD PEN NEEDLE MICRO U/F) 32G  X 6 MM MISC, Use with injection 6 times daily Dx E10.9, Disp: 600 each, Rfl: 3 .  omeprazole (PRILOSEC) 20 MG capsule, TAKE 1 CAPSULE BY MOUTH EVERY DAY, Disp: 90 capsule, Rfl: 0 .  OneTouch Delica Lancets 69G MISC, , Disp: , Rfl:    Allergies  Allergen Reactions  . Codeine Nausea And Vomiting   Past Medical History:  Diagnosis Date  . Diabetes mellitus without complication ([place])   . GERD (gastroesophageal reflux disease)   . Hypertension associated with diabetes ([place]) 02/29/2016  . Rosacea 01/28/2019    Past Surgical History:  Procedure Laterality Date  . BACK  SURGERY    . broken nose      Social History   Socioeconomic History  . Marital status: Divorced    Spouse name: Not on file  . Number of children: 1  . Years of education: 51  . 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 Smoking status: Never Smoker  . Smokeless tobacco: Never Used  Vaping Use  . Vaping Use: Never used  Substance and Sexual Activity  . Alcohol use: Yes    Alcohol/week: 3.0 - 4.0 standard drinks    Types: 3 - 4 Standard drinks or equivalent per week  . Drug use: Not Currently  . Sexual activity: Not on file  Other Topics Concern  . Not on file  Social History Narrative   Lives alone   Social Determinants of Health   Financial Resource Strain: Low Risk   . Difficulty of Paying Living Expenses: Not hard at all  Food Insecurity: No Food Insecurity  . Worried About Charity fundraiser in the Last Year: Never true  . Ran Out of Food in the Last Year: Never true  Transportation Needs: No Transportation Needs  . Lack of Transportation (Medical): No  . Lack of Transportation (Non-Medical): No  Physical Activity: Inactive  . Days of Exercise per Week: 0 days  . Minutes of Exercise per Session: 0 min  Stress: Stress Concern Present  . Feeling of Stress : To some extent  Social Connections: Moderately Integrated  . Frequency of Communication with Friends and Family: More than three times a week  . Frequency of Social Gatherings with Friends and Family: More than three times a week  . Attends Religious Services: 1 to 4 times per year  . Active Member of Clubs or Organizations: Yes  . Attends Archivist Meetings: 1 to 4 times per year  . Marital Status: Divorced  Human resources officer Violence: Not At Risk  . Fear of Current or Ex-Partner: No  . Emotionally Abused: No  . Physically Abused: No  . Sexually Abused: No        Objective:    BP 130/78   Pulse (!) 101   Temp  98.4 F (36.9 C) (Temporal)   Ht _0  (1.854 m)   Wt 189 lb 3.2 oz (85.8 kg)   SpO2 96%   BMI 24.96 kg/m   Wt Readings from Last 3 Encounters:  09/14/20 189 lb 3.2 oz (85.8 kg)  09/02/20 195 lb (88.5 kg)  07/22/20 194 lb 6.4 oz (88.2 kg)    Physical Exam Vitals reviewed.  Constitutional:      General: He is not in acute distress.    Appearance: Normal appearance. He is normal weight. He is not ill-appearing, toxic-appearing or diaphoretic.  HENT:  Head: Normocephalic and atraumatic.  Eyes:     General: No scleral icterus.       Right eye: No discharge.        Left eye: No discharge.     Conjunctiva/sclera: Conjunctivae normal.  Cardiovascular:     Rate and Rhythm: Normal rate and regular rhythm.     Heart sounds: Normal heart sounds. No murmur heard. No friction rub. No gallop.   Pulmonary:     Effort: Pulmonary effort is normal. No respiratory distress.     Breath sounds: Normal breath sounds. No stridor. No wheezing, rhonchi or rales.  Musculoskeletal:        General: Normal range of motion.     Cervical back: Normal range of motion.  Skin:    General: Skin is warm and dry.  Neurological:     Mental Status: He is alert and oriented to person, place, and time. Mental status is at baseline.     Motor: Tremor (with rest and action) present.  Psychiatric:        Mood and Affect: Mood normal.        Behavior: Behavior normal.        Thought Content: Thought content normal.        Judgment: Judgment normal.     No results found for: TSH Lab Results  Component Value Date   WBC 6.1 07/22/2020   HGB 13.7 07/22/2020   HCT 39.5 07/22/2020   MCV 101 (H) 07/22/2020   PLT 171 07/22/2020   Lab Results  Component Value Date   NA 136 07/22/2020   K 4.6 07/22/2020   CO2 22 07/22/2020   GLUCOSE 232 (H) 07/22/2020   BUN 7 (L) 07/22/2020   CREATININE 0.61 (L) 07/22/2020   BILITOT 0.9 07/22/2020   ALKPHOS 135 (H) 07/22/2020   AST 54 (H) 07/22/2020   ALT 35  07/22/2020   PROT 6.9 07/22/2020   ALBUMIN 4.0 07/22/2020   CALCIUM 9.5 07/22/2020   Lab Results  Component Value Date   CHOL 180 07/22/2020   Lab Results  Component Value Date   HDL 105 07/22/2020   Lab Results  Component Value Date   LDLCALC 61 07/22/2020   Lab Results  Component Value Date   TRIG 76 07/22/2020   Lab Results  Component Value Date   CHOLHDL 1.7 07/22/2020   Lab Results  Component Value Date   HGBA1C 6.5 07/22/2020

## 2020-09-14 NOTE — Patient Instructions (Signed)
Please complete your cologuard and return.  Please also schedule your diabetic eye exam.

## 2020-09-15 LAB — BMP8+EGFR
BUN/Creatinine Ratio: 9 — ABNORMAL LOW (ref 10–24)
BUN: 5 mg/dL — ABNORMAL LOW (ref 8–27)
CO2: 23 mmol/L (ref 20–29)
Calcium: 9 mg/dL (ref 8.6–10.2)
Chloride: 102 mmol/L (ref 96–106)
Creatinine, Ser: 0.54 mg/dL — ABNORMAL LOW (ref 0.76–1.27)
Glucose: 112 mg/dL — ABNORMAL HIGH (ref 65–99)
Potassium: 4.9 mmol/L (ref 3.5–5.2)
Sodium: 142 mmol/L (ref 134–144)
eGFR: 108 mL/min/{1.73_m2} (ref >59)

## 2020-10-14 ENCOUNTER — Encounter: Payer: Self-pay | Admitting: Family Medicine

## 2020-10-14 ENCOUNTER — Other Ambulatory Visit: Payer: Self-pay

## 2020-10-14 ENCOUNTER — Ambulatory Visit (INDEPENDENT_AMBULATORY_CARE_PROVIDER_SITE_OTHER): Payer: Medicare Other | Admitting: Family Medicine

## 2020-10-14 VITALS — BP 129/78 | HR 93 | Ht 73.0 in | Wt 192.0 lb

## 2020-10-14 DIAGNOSIS — R1011 Right upper quadrant pain: Secondary | ICD-10-CM

## 2020-10-14 NOTE — Progress Notes (Signed)
BP 129/78   Pulse 93   Ht 6\' 1"  (1.854 m)   Wt 192 lb (87.1 kg)   SpO2 97%   BMI 25.33 kg/m    Subjective:   Patient ID: Dustin Graham, male    DOB: June 18, 1950, 70 y.o.   MRN: [id number]  HPI: Dustin Graham is a 70 y.o. male presenting on 10/14/2020 for Abdominal Pain (Right mid abdomen. Had several years ago. Had scans during that time. Never found an answer. Pain has become worse.)   HPI Patient is coming in complaining of right upper abdominal pain that is been bothering him.  He says he had something like this in the past but more recently has become more frequent.  He says over the past couple weeks he is getting a dull ache every afternoon up and that right side and then 2 days ago he had an episode where it was very sharp and severe, it does get more painful when he turns or twists his body.  The other day he had this pain that was keeping him up at night and does not normally do that.  It usually is better when he lays down.  He says he had some similar 10 years ago and had a work-up and did not find anything.  Patient says he gets a little constipated right now but he did have a good bowel movement this morning.  He denies any Rea or blood in his stool.  Patient denies any nausea or vomiting.  He denies any fevers or chills.  He denies any burning or pain with urination.  Patient does have some back pain issues but those have not changed.  Relevant past medical, surgical, family and social history reviewed and updated as indicated. Interim medical history since our last visit reviewed. Allergies and medications reviewed and updated.  Review of Systems  Constitutional: Negative for chills and fever.  Respiratory: Negative for shortness of breath and wheezing.   Cardiovascular: Negative for chest pain and leg swelling.  Gastrointestinal: Positive for abdominal pain. Negative for abdominal distention, blood in stool, constipation, diarrhea and vomiting.  Genitourinary: Negative  for flank pain.  Musculoskeletal: Negative for back pain and gait problem.  Skin: Negative for rash.  All other systems reviewed and are negative.   Per HPI unless specifically indicated above   Allergies as of 10/14/2020      Reactions   Codeine Nausea And Vomiting      Medication List       Accurate as of Oct 14, 2020 11:58 AM. If you have any questions, ask your nurse or doctor.        atorvastatin 40 MG tablet Commonly known as: LIPITOR Take 1 tablet (40 mg total) by mouth daily.   BC HEADACHE POWDER PO Take by mouth.   BD Pen Needle Micro U/F 32G X 6 MM Misc Generic drug: Insulin Pen Needle Use with injection 6 times daily Dx E10.9   Contour Next Test test strip Generic drug: glucose blood Test 3 times a day. Dx. E11.9   enalapril 20 MG tablet Commonly known as: VASOTEC Take 1 tablet (20 mg total) by mouth daily.   FreeStyle Libre 2 Reader [place] Use to test blood sugar up to 6 times daily. DX; E11.9   FreeStyle Libre 2 Sensor Misc Use to test blood sugar up to 6 times daily. DX: E11.9   NovoLOG FlexPen 100 UNIT/ML FlexPen Generic drug: insulin aspart Inject 30 Units into the skin 3 (three)  times daily with meals.   omeprazole 20 MG capsule Commonly known as: PRILOSEC TAKE 1 CAPSULE BY MOUTH EVERY DAY   OneTouch Delica Lancets 69G Misc   Tresiba FlexTouch 200 UNIT/ML FlexTouch Pen Generic drug: insulin degludec Inject 20 Units into the skin daily.        Objective:   BP 129/78   Pulse 93   Ht 6\' 1"  (1.854 m)   Wt 192 lb (87.1 kg)   SpO2 97%   BMI 25.33 kg/m   Wt Readings from Last 3 Encounters:  10/14/20 192 lb (87.1 kg)  09/14/20 189 lb 3.2 oz (85.8 kg)  09/02/20 195 lb (88.5 kg)    Physical Exam Vitals and nursing note reviewed.  Constitutional:      Appearance: He is well-developed.  Abdominal:     General: Abdomen is flat. There is no distension. There are no signs of injury.     Tenderness: There is abdominal tenderness in the  right upper quadrant. There is no right CVA tenderness, guarding or rebound. Positive signs include Murphy's sign.  Neurological:     Mental Status: He is alert.       Assessment & Plan:   Problem List Items Addressed This Visit   None   Visit Diagnoses    RUQ abdominal pain    -  Primary   Relevant Orders   US ABDOMEN LIMITED RUQ (LIVER/GB)      Will order right upper quadrant abdominal ultrasound to rule out gallbladder  May consider if negative that it could be related to stomach acid issues versus muscular versus nerve issues from spinal issues. Follow up plan: Return if symptoms worsen or fail to improve.  Counseling provided for all of the vaccine components No orders of the defined types were placed in this encounter.   Caryl Pina, MD [facility] 10/14/2020, 11:58 AM

## 2020-10-26 ENCOUNTER — Other Ambulatory Visit: Payer: Self-pay

## 2020-10-26 ENCOUNTER — Telehealth: Payer: Self-pay | Admitting: Family Medicine

## 2020-10-26 ENCOUNTER — Ambulatory Visit (HOSPITAL_COMMUNITY)
Admission: RE | Admit: 2020-10-26 | Discharge: 2020-10-26 | Disposition: A | Discharge status: Home / Self Care | Payer: Medicare Other | Source: Ambulatory Visit | Attending: Family Medicine | Admitting: Family Medicine

## 2020-10-26 DIAGNOSIS — R1011 Right upper quadrant pain: Secondary | ICD-10-CM | POA: Insufficient documentation

## 2020-10-26 NOTE — Telephone Encounter (Signed)
Ordered by you

## 2020-10-26 NOTE — Telephone Encounter (Signed)
Please review patients [place] results ASAP and call patient with results.

## 2020-10-27 ENCOUNTER — Other Ambulatory Visit: Payer: Self-pay | Admitting: Family Medicine

## 2020-10-27 DIAGNOSIS — R1011 Right upper quadrant pain: Secondary | ICD-10-CM

## 2020-10-27 NOTE — Progress Notes (Unsigned)
Placed order for HIDA scan

## 2020-11-02 ENCOUNTER — Other Ambulatory Visit: Payer: Self-pay | Admitting: Family Medicine

## 2020-11-08 ENCOUNTER — Encounter (HOSPITAL_COMMUNITY)
Admission: RE | Admit: 2020-11-08 | Discharge: 2020-11-08 | Disposition: A | Discharge status: Home / Self Care | Payer: Medicare Other | Source: Ambulatory Visit | Attending: Family Medicine | Admitting: Family Medicine

## 2020-11-08 ENCOUNTER — Encounter (HOSPITAL_COMMUNITY): Payer: Self-pay

## 2020-11-08 DIAGNOSIS — R1011 Right upper quadrant pain: Secondary | ICD-10-CM | POA: Insufficient documentation

## 2020-11-08 MED ORDER — TECHNETIUM TC 99M MEBROFENIN IV KIT
5.0000 | PACK | Freq: Once | INTRAVENOUS | Status: AC | PRN
  Administered 2020-11-08: 5.4 via INTRAVENOUS

## 2020-11-09 NOTE — Progress Notes (Signed)
Pt aware and will monitor, has not had any issues in last couple days, not sure what triggers it. Will call back if sxs persist

## 2020-12-12 ENCOUNTER — Telehealth: Payer: Self-pay | Admitting: Family Medicine

## 2020-12-12 NOTE — Telephone Encounter (Signed)
Pt wants to know if there is an alternative for gallstones found in scan. He does not want to have surgery if able to take medication. Please call back

## 2020-12-12 NOTE — Telephone Encounter (Signed)
Patient aware.

## 2020-12-12 NOTE — Telephone Encounter (Signed)
There is no medication you can take for gall stones

## 2020-12-17 ENCOUNTER — Other Ambulatory Visit: Payer: Self-pay | Admitting: Family Medicine

## 2020-12-17 DIAGNOSIS — E1159 Type 2 diabetes mellitus with other circulatory complications: Secondary | ICD-10-CM

## 2020-12-19 ENCOUNTER — Other Ambulatory Visit: Payer: Self-pay | Admitting: Family Medicine

## 2020-12-19 DIAGNOSIS — E1159 Type 2 diabetes mellitus with other circulatory complications: Secondary | ICD-10-CM

## 2020-12-19 DIAGNOSIS — I152 Hypertension secondary to endocrine disorders: Secondary | ICD-10-CM

## 2020-12-21 ENCOUNTER — Encounter: Payer: Self-pay | Admitting: Family Medicine

## 2020-12-21 ENCOUNTER — Ambulatory Visit (INDEPENDENT_AMBULATORY_CARE_PROVIDER_SITE_OTHER): Payer: Medicare Other | Admitting: Family Medicine

## 2020-12-21 ENCOUNTER — Other Ambulatory Visit: Payer: Self-pay

## 2020-12-21 VITALS — BP 128/75 | HR 112 | Temp 97.9°F | Resp 20 | Ht 73.0 in | Wt 193.0 lb

## 2020-12-21 DIAGNOSIS — K8020 Calculus of gallbladder without cholecystitis without obstruction: Secondary | ICD-10-CM

## 2020-12-21 DIAGNOSIS — E782 Mixed hyperlipidemia: Secondary | ICD-10-CM

## 2020-12-21 DIAGNOSIS — E1159 Type 2 diabetes mellitus with other circulatory complications: Secondary | ICD-10-CM

## 2020-12-21 DIAGNOSIS — I152 Hypertension secondary to endocrine disorders: Secondary | ICD-10-CM

## 2020-12-21 LAB — BAYER DCA HB A1C WAIVED: HB A1C (BAYER DCA - WAIVED): 6.9 % (ref <7.0)

## 2020-12-21 MED ORDER — TRULICITY 0.75 MG/0.5ML ~~LOC~~ SOAJ: 0.7500 mg | SUBCUTANEOUS | 2 refills | Status: DC

## 2020-12-21 NOTE — Progress Notes (Signed)
Assessment & Plan:  1. Type 2 diabetes mellitus with other circulatory complications (HCC) Lab Results  Component Value Date   HGBA1C 6.9 12/21/2020   HGBA1C 6.5 07/22/2020   HGBA1C 7.4 (H) 01/06/2020    - Diabetes is at goal of A1c < 7, but is not within target range at the goal of 70%. He is having episodes of hyperglycemia. No hypoglycemia. - Medications:  Continue Tresiba 30 units daily, decrease Novolog by 5-10 units with meals, and start Trulicity 7.62 mg once weekly.  - Home glucose monitoring: continue using Freestyle Libre - Patient is currently taking a statin. Patient is taking an ACE-inhibitor/ARB.  - Instruction/counseling given: reminded to get eye exam and reminded to bring blood glucose meter & log to each visit  Diabetes Health Maintenance Due  Topic Date Due   OPHTHALMOLOGY EXAM  06/09/2020   HEMOGLOBIN A1C  06/23/2021   FOOT EXAM  07/22/2021    - CMP14+EGFR - Bayer DCA Hb A1c Waived - Dulaglutide (TRULICITY) 2.63 FH/5.4TG SOPN; Inject 0.75 mg into the skin once a week.  Dispense: 2 mL; Refill: 2  2. Hypertension associated with diabetes ([place]) Well controlled on current regimen.  - CMP14+EGFR  3. Calculus of gallbladder without cholecystitis without obstruction Education provided on a gallbladder eating plan.   Return in about 4 weeks (around 01/18/2021) for DM Almyra Free), then 3 months with me for DM.  Hendricks Limes, MSN, APRN, FNP-C [facility]  Subjective:    Patient ID: Dustin Graham, male    DOB: 06/13/1950, 69 y.o.   MRN: [id number]  Patient Care Team: Loman Brooklyn, FNP as PCP - General (Family Medicine) Lavera Guise, [facility] (Pharmacist) Celestia Khat, [place] (Optometry)   Chief Complaint:  Chief Complaint  Patient presents with   Medical Management of Chronic Issues    3 mo     HPI: Dustin Graham is a 70 y.o. male presenting on 12/21/2020 for Medical Management of Chronic Issues (3 mo )  Diabetes: Patient  presents for follow up of diabetes. Current symptoms include: hyperglycemia. Known diabetic complications: none. Medication compliance: currently taking Tresiba 30 units once daily and using his Novolog with meals as a sliding scale (up to 22 units). Current diet: not asked. Home blood sugar records:  using the [facility] - he is spending 68% in target range; 0% low/very low, 27% high, and 5% very high. His CGM is active 98% of the time.  Is he  on ACE inhibitor or angiotensin II receptor blocker? Yes. Is he on a statin? Yes.   Hypertension: patient does not check BP at home.   New complaints: Patient reports RUQ pain often after eating. He has had an abdominal ultrasound which showed cholelithiasis without evidence of cholecystitis. He does not wish to be referred for surgical consultation at this time.   Social history:  Relevant past medical, surgical, family and social history reviewed and updated as indicated. Interim medical history since our last visit reviewed.  Allergies and medications reviewed and updated.  DATA REVIEWED: CHART IN EPIC  ROS: Negative unless specifically indicated above in HPI.    Current Outpatient Medications:    Aspirin-Salicylamide-Caffeine (BC HEADACHE POWDER PO), Take by mouth., Disp: , Rfl:    atorvastatin (LIPITOR) 40 MG tablet, Take 1 tablet (40 mg total) by mouth daily., Disp: 90 tablet, Rfl: 1   Continuous Blood Gluc Receiver (FREESTYLE LIBRE 2 READER) DEVI, Use to test blood sugar up to 6 times daily. DX; E11.9,  Disp: 1 each, Rfl: 0   Continuous Blood Gluc Sensor (FREESTYLE LIBRE 2 SENSOR) MISC, Use to test blood sugar up to 6 times daily. DX: E11.9, Disp: 2 each, Rfl: 11   enalapril (VASOTEC) 20 MG tablet, TAKE 1 TABLET BY MOUTH EVERY DAY, Disp: 90 tablet, Rfl: 0   insulin aspart (NOVOLOG FLEXPEN) 100 UNIT/ML FlexPen, Inject 30 Units into the skin 3 (three) times daily with meals., Disp: 30 mL, Rfl: 2   Insulin Pen Needle (BD PEN NEEDLE MICRO  U/F) 32G X 6 MM MISC, Use with injection 6 times daily Dx E10.9, Disp: 600 each, Rfl: 3   omeprazole (PRILOSEC) 20 MG capsule, TAKE 1 CAPSULE BY MOUTH EVERY DAY, Disp: 90 capsule, Rfl: 0   TRESIBA FLEXTOUCH 200 UNIT/ML FlexTouch Pen, INJECT 20 UNITS INTO THE SKIN DAILY., Disp: 9 mL, Rfl: 0   glucose blood (CONTOUR NEXT TEST) test strip, Test 3 times a day. Dx. E11.9 (Patient not taking: Reported on 12/21/2020), Disp: 100 each, Rfl: 3   OneTouch Delica Lancets 33G MISC, , Disp: , Rfl:    Allergies  Allergen Reactions   Codeine Nausea And Vomiting   Past Medical History:  Diagnosis Date   Diabetes mellitus without complication (HCC)    GERD (gastroesophageal reflux disease)    Hypertension associated with diabetes (HCC) 02/29/2016   Rosacea 01/28/2019    Past Surgical History:  Procedure Laterality Date   BACK SURGERY     broken nose      Social History   Socioeconomic History   Marital status: Divorced    Spouse name: Not on file   Number of children: 1   Years of education: 12   Highest education level: Some college, no degree  Occupational History    Employer: [employer] manager  Tobacco Use   Smoking status: Never   Smokeless tobacco: Never  Vaping Use   Vaping Use: Never used  Substance and Sexual Activity   Alcohol use: Yes    Alcohol/week: 3.0 - 4.0 standard drinks    Types: 3 - 4 Standard drinks or equivalent per week   Drug use: Not Currently   Sexual activity: Not on file  Other Topics Concern   Not on file  Social History Narrative   Lives alone   Social Determinants of Health   Financial Resource Strain: Low Risk    Difficulty of Paying Living Expenses: Not hard at all  Food Insecurity: No Food Insecurity   Worried About Programme researcher, broadcasting/film/video in the Last Year: Never true   Ran Out of Food in the Last Year: Never true  Transportation Needs: No Transportation Needs   Lack of Transportation (Medical): No   Lack of Transportation (Non-Medical): No  Physical  Activity: Inactive   Days of Exercise per Week: 0 days   Minutes of Exercise per Session: 0 min  Stress: Stress Concern Present   Feeling of Stress : To some extent  Social Connections: Moderately Integrated   Frequency of Communication with Friends and Family: More than three times a week   Frequency of Social Gatherings with Friends and Family: More than three times a week   Attends Religious Services: 1 to 4 times per year   Active Member of [employer] or Organizations: Yes   Attends Banker Meetings: 1 to 4 times per year   Marital Status: Divorced  Catering manager Violence: Not At Risk   Fear of Current or Ex-Partner: No   Emotionally Abused: No  Physically Abused: No   Sexually Abused: No        Objective:    BP 128/75   Pulse (!) 112   Temp 97.9 F (36.6 C)   Resp 20   Ht $R'6\' 1"'qp$  (1.854 m)   Wt 193 lb (87.5 kg)   SpO2 98%   BMI 25.46 kg/m   Wt Readings from Last 3 Encounters:  12/21/20 193 lb (87.5 kg)  10/14/20 192 lb (87.1 kg)  09/14/20 189 lb 3.2 oz (85.8 kg)    Physical Exam Vitals reviewed.  Constitutional:      General: He is not in acute distress.    Appearance: Normal appearance. He is normal weight. He is not ill-appearing, toxic-appearing or diaphoretic.  HENT:     Head: Normocephalic and atraumatic.  Eyes:     General: No scleral icterus.       Right eye: No discharge.        Left eye: No discharge.     Conjunctiva/sclera: Conjunctivae normal.  Cardiovascular:     Rate and Rhythm: Normal rate and regular rhythm.     Heart sounds: Normal heart sounds. No murmur heard.   No friction rub. No gallop.  Pulmonary:     Effort: Pulmonary effort is normal. No respiratory distress.     Breath sounds: Normal breath sounds. No stridor. No wheezing, rhonchi or rales.  Musculoskeletal:        General: Normal range of motion.     Cervical back: Normal range of motion.  Skin:    General: Skin is warm and dry.  Neurological:     Mental Status:  He is alert and oriented to person, place, and time. Mental status is at baseline.  Psychiatric:        Mood and Affect: Mood normal.        Behavior: Behavior normal.        Thought Content: Thought content normal.        Judgment: Judgment normal.   Diabetic Foot Exam - Simple   No data filed     No results found for: TSH Lab Results  Component Value Date   WBC 6.1 07/22/2020   HGB 13.7 07/22/2020   HCT 39.5 07/22/2020   MCV 101 (H) 07/22/2020   PLT 171 07/22/2020   Lab Results  Component Value Date   NA 142 09/14/2020   K 4.9 09/14/2020   CO2 23 09/14/2020   GLUCOSE 112 (H) 09/14/2020   BUN 5 (L) 09/14/2020   CREATININE 0.54 (L) 09/14/2020   BILITOT 0.9 07/22/2020   ALKPHOS 135 (H) 07/22/2020   AST 54 (H) 07/22/2020   ALT 35 07/22/2020   PROT 6.9 07/22/2020   ALBUMIN 4.0 07/22/2020   CALCIUM 9.0 09/14/2020   EGFR 108 09/14/2020   Lab Results  Component Value Date   CHOL 180 07/22/2020   Lab Results  Component Value Date   HDL 105 07/22/2020   Lab Results  Component Value Date   LDLCALC 61 07/22/2020   Lab Results  Component Value Date   TRIG 76 07/22/2020   Lab Results  Component Value Date   CHOLHDL 1.7 07/22/2020   Lab Results  Component Value Date   HGBA1C 6.5 07/22/2020

## 2020-12-21 NOTE — Patient Instructions (Addendum)
Schedule your diabetic eye exam.   Complete your Cologuard.  Decrease Novolog by 5-10 units with meals. Continue Tresiba at the current dosage.

## 2020-12-22 LAB — CMP14+EGFR
ALT: 33 IU/L (ref 0–44)
AST: 53 IU/L — ABNORMAL HIGH (ref 0–40)
Albumin/Globulin Ratio: 1.4 (ref 1.2–2.2)
Albumin: 4.1 g/dL (ref 3.8–4.8)
Alkaline Phosphatase: 130 IU/L — ABNORMAL HIGH (ref 44–121)
BUN/Creatinine Ratio: 12 (ref 10–24)
BUN: 8 mg/dL (ref 8–27)
Bilirubin Total: 0.9 mg/dL (ref 0.0–1.2)
CO2: 23 mmol/L (ref 20–29)
Calcium: 9.3 mg/dL (ref 8.6–10.2)
Chloride: 100 mmol/L (ref 96–106)
Creatinine, Ser: 0.69 mg/dL — ABNORMAL LOW (ref 0.76–1.27)
Globulin, Total: 2.9 g/dL (ref 1.5–4.5)
Glucose: 179 mg/dL — ABNORMAL HIGH (ref 65–99)
Potassium: 4.7 mmol/L (ref 3.5–5.2)
Sodium: 141 mmol/L (ref 134–144)
Total Protein: 7 g/dL (ref 6.0–8.5)
eGFR: 100 mL/min/{1.73_m2} (ref >59)

## 2020-12-24 ENCOUNTER — Encounter: Payer: Self-pay | Admitting: Family Medicine

## 2021-01-19 ENCOUNTER — Other Ambulatory Visit: Payer: Self-pay | Admitting: Family Medicine

## 2021-01-19 ENCOUNTER — Ambulatory Visit: Payer: Medicare Other | Admitting: Pharmacist

## 2021-01-19 DIAGNOSIS — E1159 Type 2 diabetes mellitus with other circulatory complications: Secondary | ICD-10-CM

## 2021-02-03 ENCOUNTER — Other Ambulatory Visit: Payer: Self-pay | Admitting: Family Medicine

## 2021-02-03 DIAGNOSIS — E1159 Type 2 diabetes mellitus with other circulatory complications: Secondary | ICD-10-CM

## 2021-02-17 ENCOUNTER — Telehealth: Payer: Self-pay

## 2021-02-17 NOTE — Telephone Encounter (Signed)
Transition Care Management Follow-up Telephone Call Date of discharge and from where: 02/11/21 from [facility] How have you been since you were released from the hospital? "A little 'queezy', a little sore" spoke with surgeon's nurse yesterday will follow up on Wednesday for an appointment. Any questions or concerns? No  Items Reviewed: Did the pt receive and understand the discharge instructions provided? Yes -reviewed discharge instructions with patient Medications obtained and verified? Yes  Other? No  Any new allergies since your discharge? No  Dietary orders reviewed? Yes Do you have support at home?  No support living with patient, but has someone he can call.  Home Care and Equipment/Supplies: Were home health services ordered? no If so, what is the name of the agency? N/a  Has the agency set up a time to come to the patient's home? not applicable Were any new equipment or medical supplies ordered?  No What is the name of the medical supply agency? N/a Were you able to get the supplies/equipment? not applicable Do you have any questions related to the use of the equipment or supplies? No  Functional Questionnaire: (I = Independent and D = Dependent) ADLs: I  Bathing/Dressing- I  Meal Prep- I  Eating- I  Maintaining continence- I  Transferring/Ambulation- I  Managing Meds- I  Follow up appointments reviewed:  PCP Hospital f/u appt confirmed? No  RNCM discussed the importance of scheduling appointment and following up with PCP. [place] [facility] f/u appt confirmed? Yes  Scheduled to see Dr. Ladona Horns on 02/15/21. Are transportation arrangements needed? No  If their condition worsens, is the pt aware to call PCP or go to the Emergency Dept.? Yes Was the patient provided with contact information for the PCP's office or ED? Yes Was to pt encouraged to call back with questions or concerns? Yes    Thea Silversmith, RN, MSN, BSN, [place] Care Management  Coordinator [phone]

## 2021-02-19 ENCOUNTER — Other Ambulatory Visit: Payer: Self-pay | Admitting: Family Medicine

## 2021-02-19 DIAGNOSIS — E1159 Type 2 diabetes mellitus with other circulatory complications: Secondary | ICD-10-CM

## 2021-03-07 ENCOUNTER — Other Ambulatory Visit: Payer: Self-pay | Admitting: Family Medicine

## 2021-03-07 DIAGNOSIS — E1159 Type 2 diabetes mellitus with other circulatory complications: Secondary | ICD-10-CM

## 2021-03-13 ENCOUNTER — Other Ambulatory Visit: Payer: Self-pay | Admitting: Family Medicine

## 2021-03-13 DIAGNOSIS — E782 Mixed hyperlipidemia: Secondary | ICD-10-CM

## 2021-03-13 DIAGNOSIS — E1159 Type 2 diabetes mellitus with other circulatory complications: Secondary | ICD-10-CM

## 2021-03-22 ENCOUNTER — Ambulatory Visit (INDEPENDENT_AMBULATORY_CARE_PROVIDER_SITE_OTHER): Payer: Medicare Other | Admitting: Family Medicine

## 2021-03-22 ENCOUNTER — Other Ambulatory Visit: Payer: Self-pay

## 2021-03-22 ENCOUNTER — Encounter: Payer: Self-pay | Admitting: Family Medicine

## 2021-03-22 ENCOUNTER — Other Ambulatory Visit: Payer: Self-pay | Admitting: Family Medicine

## 2021-03-22 VITALS — BP 141/86 | HR 107 | Temp 97.6°F | Ht 73.0 in | Wt 192.8 lb

## 2021-03-22 DIAGNOSIS — E1065 Type 1 diabetes mellitus with hyperglycemia: Secondary | ICD-10-CM

## 2021-03-22 DIAGNOSIS — I152 Hypertension secondary to endocrine disorders: Secondary | ICD-10-CM

## 2021-03-22 DIAGNOSIS — R748 Abnormal levels of other serum enzymes: Secondary | ICD-10-CM | POA: Diagnosis not present

## 2021-03-22 DIAGNOSIS — E1159 Type 2 diabetes mellitus with other circulatory complications: Secondary | ICD-10-CM

## 2021-03-22 DIAGNOSIS — E782 Mixed hyperlipidemia: Secondary | ICD-10-CM

## 2021-03-22 DIAGNOSIS — R1011 Right upper quadrant pain: Secondary | ICD-10-CM

## 2021-03-22 DIAGNOSIS — Z23 Encounter for immunization: Secondary | ICD-10-CM

## 2021-03-22 DIAGNOSIS — K7460 Unspecified cirrhosis of liver: Secondary | ICD-10-CM

## 2021-03-22 NOTE — Progress Notes (Signed)
Assessment & Plan:  1. Controlled type 1 diabetes mellitus with hyperglycemia ([place]) - unclear if patient is type 1 vs type 2 based on history, will check C-peptide to confirm Lab Results  Component Value Date   HGBA1C 6.9 12/21/2020   HGBA1C 6.5 07/22/2020   HGBA1C 7.4 (H) 01/06/2020    - Diabetes is at goal of A1c < 7. - Medications: continue current medications - Home glucose monitoring: continue using libre - Patient is currently taking a statin. Patient is taking an ACE-inhibitor/ARB.   Diabetes Health Maintenance Due  Topic Date Due   OPHTHALMOLOGY EXAM  06/09/2020   HEMOGLOBIN A1C  06/23/2021   FOOT EXAM  07/22/2021    - A1C at goal <7  - C-peptide - CMP14+EGFR  2-4. Elevated liver enzymes/RUQ abdominal pain/Hepatic cirrhosis - recheck LFTs  - Ambulatory referral to GI for evaluation of cirrhosis found on imaging while admitted to the hospital - education provided on cirrhosis - CMP14+EGFR  5. Mixed hyperlipidemia - Well controlled on current regimen - continue statin - Lipid panel - CMP14+EGFR  5. Hypertension associated with diabetes ([place]) Well controlled on current regimen.  - CMP14+EGFR - Lipid panel  6. Need for immunization against influenza - Flu Vaccine QUAD High Dose(Fluad)   Return in about 3 months (around 06/22/2021) for follow-up of chronic medication conditions.  Lucile Crater, NP Student  I personally was present during the history, physical exam, and medical decision-making activities of this service and have verified that the service and findings are accurately documented in the nurse practitioner student's note.  Hendricks Limes, MSN, APRN, FNP-C [facility]   Subjective:    Patient ID: Dustin Graham, male    DOB: 06-23-1950, 70 y.o.   MRN: [id number]  Patient Care Team: Loman Brooklyn, FNP as PCP - General (Family Medicine) Lavera Guise, [facility] (Pharmacist) Okey Regal, OD (Optometry)   Chief  Complaint:  Chief Complaint  Patient presents with   Diabetes    HPI: Dustin Graham is a 70 y.o. male presenting on 03/22/2021 for Diabetes  Type 1 Diabetes: Patient presents for follow up of diabetes. Current symptoms include: hyperglycemia. Known diabetic complications: none. Medication compliance: currently taking Tresiba 32 units once daily and using his Novolog with meals based on what he is eating (up to 22 units). Current diet: not asked. Home blood sugar records:  uses [place] which is active 97% of the time.  Is he  on ACE inhibitor or angiotensin II receptor blocker? Yes. Is he on a statin? Yes. He states he did not start the trulicity because he is a type 1 diabetic. He states he was diagnosed at approximately age 60 and has been on insulin since. His A1C during his hospitalization a month ago was 6.6. His abdominal CT showed pancreatic atrophy.     Hypertension: patient does not check BP at home.   New Complaints: He recently had his gallbladder removed. He states he is still having pain in his right upper quadrant that is similar to the pain he was experiencing before he had his gallbladder removed. He states he mentioned this to his surgeon and he stated it was to be expected since he is 3 weeks post-op. He states he isn't tolerating the pain medication from his surgeon because it makes him nauseas. His labs from his hospitalization had elevated liver enzymes and imaging including an abdominal CT and ultrasound noted hepatic cirrhosis. Hepatitis panel negative. He has not seen a GI specialist.  Social history:  Relevant past medical, surgical, family and social history reviewed and updated as indicated. Interim medical history since our last visit reviewed.  Allergies and medications reviewed and updated.  DATA REVIEWED: CHART IN EPIC  ROS: Negative unless specifically indicated above in HPI.    Current Outpatient Medications:    Aspirin-Salicylamide-Caffeine (BC  HEADACHE POWDER PO), Take by mouth., Disp: , Rfl:    atorvastatin (LIPITOR) 40 MG tablet, TAKE 1 TABLET BY MOUTH EVERY DAY, Disp: 90 tablet, Rfl: 0   Continuous Blood Gluc Receiver (FREESTYLE LIBRE 2 READER) DEVI, Use to test blood sugar up to 6 times daily. DX; E11.9, Disp: 1 each, Rfl: 0   Continuous Blood Gluc Sensor (FREESTYLE LIBRE 2 SENSOR) MISC, Use to test blood sugar up to 6 times daily. DX: E11.9, Disp: 2 each, Rfl: 11   Dulaglutide (TRULICITY) 1.02 HE/5.2DP SOPN, Inject 0.75 mg into the skin once a week., Disp: 2 mL, Rfl: 2   enalapril (VASOTEC) 20 MG tablet, TAKE 1 TABLET BY MOUTH EVERY DAY, Disp: 90 tablet, Rfl: 0   glucose blood (CONTOUR NEXT TEST) test strip, Test 3 times a day. Dx. E11.9, Disp: 100 each, Rfl: 3   insulin degludec (TRESIBA FLEXTOUCH) 200 UNIT/ML FlexTouch Pen, Inject 20 Units into the skin daily., Disp: 9 mL, Rfl: 0   Insulin Pen Needle (BD PEN NEEDLE MICRO U/F) 32G X 6 MM MISC, Use with injection 6 times daily Dx E10.9, Disp: 600 each, Rfl: 3   NOVOLOG FLEXPEN 100 UNIT/ML FlexPen, INJECT 30 UNITS INTO THE SKIN 3 (THREE) TIMES DAILY WITH MEALS., Disp: 15 mL, Rfl: 0   omeprazole (PRILOSEC) 20 MG capsule, TAKE 1 CAPSULE BY MOUTH EVERY DAY, Disp: 90 capsule, Rfl: 0   OneTouch Delica Lancets 82U MISC, , Disp: , Rfl:    Allergies  Allergen Reactions   Codeine Nausea And Vomiting   Past Medical History:  Diagnosis Date   Diabetes mellitus without complication (HCC)    GERD (gastroesophageal reflux disease)    Hypertension associated with diabetes ([place]) 02/29/2016   Rosacea 01/28/2019    Past Surgical History:  Procedure Laterality Date   BACK SURGERY     broken nose      Social History   Socioeconomic History   Marital status: Divorced    Spouse name: Not on file   Number of children: 1   Years of education: 12   Highest education level: Some college, no degree  Occupational History    Employer: [employer], [employer]  Tobacco Use   Smoking status: Never    Smokeless tobacco: Never  Vaping Use   Vaping Use: Never used  Substance and Sexual Activity   Alcohol use: Yes    Alcohol/week: 3.0 - 4.0 standard drinks    Types: 3 - 4 Standard drinks or equivalent per week   Drug use: Not Currently   Sexual activity: Not on file  Other Topics Concern   Not on file  Social History Narrative   Lives alone   Social Determinants of Health   Financial Resource Strain: Low Risk    Difficulty of Paying Living Expenses: Not hard at all  Food Insecurity: No Food Insecurity   Worried About Charity fundraiser in the Last Year: Never true   Ran Out of Food in the Last Year: Never true  Transportation Needs: No Transportation Needs   Lack of Transportation (Medical): No   Lack of Transportation (Non-Medical): No  Physical Activity: Inactive   Days  of Exercise per Week: 0 days   Minutes of Exercise per Session: 0 min  Stress: Stress Concern Present   Feeling of Stress : To some extent  Social Connections: Moderately Integrated   Frequency of Communication with Friends and Family: More than three times a week   Frequency of Social Gatherings with Friends and Family: More than three times a week   Attends Religious Services: 1 to 4 times per year   Active Member of [employer] or Organizations: Yes   Attends Archivist Meetings: 1 to 4 times per year   Marital Status: Divorced  Human resources officer Violence: Not At Risk   Fear of Current or Ex-Partner: No   Emotionally Abused: No   Physically Abused: No   Sexually Abused: No        Objective:    BP (!) 141/86   Pulse (!) 107   Temp 97.6 F (36.4 C)   Ht 6' 1" (1.854 m)   Wt 87.5 kg   SpO2 97%   BMI 25.44 kg/m   Wt Readings from Last 3 Encounters:  03/22/21 192 lb 12.8 oz (87.5 kg)  12/21/20 193 lb (87.5 kg)  10/14/20 192 lb (87.1 kg)    Physical Exam Vitals reviewed.  Constitutional:      General: He is not in acute distress.    Appearance: Normal appearance. He is normal  weight. He is not ill-appearing, toxic-appearing or diaphoretic.  HENT:     Head: Normocephalic and atraumatic.  Eyes:     General: No scleral icterus.       Right eye: No discharge.        Left eye: No discharge.     Conjunctiva/sclera: Conjunctivae normal.  Cardiovascular:     Rate and Rhythm: Normal rate and regular rhythm.     Heart sounds: Normal heart sounds. No murmur heard.   No friction rub. No gallop.  Pulmonary:     Effort: Pulmonary effort is normal. No respiratory distress.     Breath sounds: Normal breath sounds. No stridor. No wheezing, rhonchi or rales.  Musculoskeletal:        General: Normal range of motion.     Cervical back: Normal range of motion.  Skin:    General: Skin is warm and dry.  Neurological:     Mental Status: He is alert and oriented to person, place, and time. Mental status is at baseline.  Psychiatric:        Mood and Affect: Mood normal.        Behavior: Behavior normal.        Thought Content: Thought content normal.        Judgment: Judgment normal.    No results found for: TSH Lab Results  Component Value Date   WBC 6.1 07/22/2020   HGB 13.7 07/22/2020   HCT 39.5 07/22/2020   MCV 101 (H) 07/22/2020   PLT 171 07/22/2020   Lab Results  Component Value Date   NA 141 12/21/2020   K 4.7 12/21/2020   CO2 23 12/21/2020   GLUCOSE 179 (H) 12/21/2020   BUN 8 12/21/2020   CREATININE 0.69 (L) 12/21/2020   BILITOT 0.9 12/21/2020   ALKPHOS 130 (H) 12/21/2020   AST 53 (H) 12/21/2020   ALT 33 12/21/2020   PROT 7.0 12/21/2020   ALBUMIN 4.1 12/21/2020   CALCIUM 9.3 12/21/2020   EGFR 100 12/21/2020   Lab Results  Component Value Date   CHOL 180 07/22/2020   Lab  Results  Component Value Date   HDL 105 07/22/2020   Lab Results  Component Value Date   LDLCALC 61 07/22/2020   Lab Results  Component Value Date   TRIG 76 07/22/2020   Lab Results  Component Value Date   CHOLHDL 1.7 07/22/2020   Lab Results  Component Value Date    HGBA1C 6.9 12/21/2020

## 2021-03-23 ENCOUNTER — Encounter: Payer: Self-pay | Admitting: Family Medicine

## 2021-03-23 LAB — CMP14+EGFR
ALT: 29 IU/L (ref 0–44)
AST: 38 IU/L (ref 0–40)
Albumin/Globulin Ratio: 1.3 (ref 1.2–2.2)
Albumin: 4.2 g/dL (ref 3.8–4.8)
Alkaline Phosphatase: 134 IU/L — ABNORMAL HIGH (ref 44–121)
BUN/Creatinine Ratio: 11 (ref 10–24)
BUN: 7 mg/dL — ABNORMAL LOW (ref 8–27)
Bilirubin Total: 1.2 mg/dL (ref 0.0–1.2)
CO2: 24 mmol/L (ref 20–29)
Calcium: 9.7 mg/dL (ref 8.6–10.2)
Chloride: 99 mmol/L (ref 96–106)
Creatinine, Ser: 0.66 mg/dL — ABNORMAL LOW (ref 0.76–1.27)
Globulin, Total: 3.3 g/dL (ref 1.5–4.5)
Glucose: 150 mg/dL — ABNORMAL HIGH (ref 70–99)
Potassium: 5.3 mmol/L — ABNORMAL HIGH (ref 3.5–5.2)
Sodium: 138 mmol/L (ref 134–144)
Total Protein: 7.5 g/dL (ref 6.0–8.5)
eGFR: 102 mL/min/{1.73_m2} (ref >59)

## 2021-03-23 LAB — LIPID PANEL
Chol/HDL Ratio: 2.2 ratio (ref 0.0–5.0)
Cholesterol, Total: 192 mg/dL (ref 100–199)
HDL: 89 mg/dL (ref >39)
LDL Chol Calc (NIH): 91 mg/dL (ref 0–99)
Triglycerides: 64 mg/dL (ref 0–149)
VLDL Cholesterol Cal: 12 mg/dL (ref 5–40)

## 2021-03-23 LAB — C-PEPTIDE: C-Peptide: <0.1 ng/mL — ABNORMAL LOW (ref 1.1–4.4)

## 2021-03-24 ENCOUNTER — Encounter: Payer: Self-pay | Admitting: Family Medicine

## 2021-03-24 ENCOUNTER — Encounter: Payer: Self-pay | Admitting: Gastroenterology

## 2021-03-27 ENCOUNTER — Other Ambulatory Visit: Payer: Self-pay | Admitting: Family Medicine

## 2021-03-27 DIAGNOSIS — E1159 Type 2 diabetes mellitus with other circulatory complications: Secondary | ICD-10-CM

## 2021-04-05 ENCOUNTER — Other Ambulatory Visit (INDEPENDENT_AMBULATORY_CARE_PROVIDER_SITE_OTHER): Payer: Medicare Other

## 2021-04-05 ENCOUNTER — Encounter: Payer: Self-pay | Admitting: Gastroenterology

## 2021-04-05 ENCOUNTER — Ambulatory Visit (INDEPENDENT_AMBULATORY_CARE_PROVIDER_SITE_OTHER): Payer: Medicare Other | Admitting: Gastroenterology

## 2021-04-05 VITALS — BP 118/68 | HR 108 | Ht 71.5 in | Wt 196.1 lb

## 2021-04-05 DIAGNOSIS — K709 Alcoholic liver disease, unspecified: Secondary | ICD-10-CM

## 2021-04-05 DIAGNOSIS — Z1211 Encounter for screening for malignant neoplasm of colon: Secondary | ICD-10-CM

## 2021-04-05 DIAGNOSIS — R1013 Epigastric pain: Secondary | ICD-10-CM | POA: Diagnosis not present

## 2021-04-05 LAB — BASIC METABOLIC PANEL
BUN: 9 mg/dL (ref 6–23)
CO2: 26 mEq/L (ref 19–32)
Calcium: 9.5 mg/dL (ref 8.4–10.5)
Chloride: 102 mEq/L (ref 96–112)
Creatinine, Ser: 0.66 mg/dL (ref 0.40–1.50)
GFR: 95.45 mL/min (ref >60.00)
Glucose, Bld: 138 mg/dL — ABNORMAL HIGH (ref 70–99)
Potassium: 4.2 mEq/L (ref 3.5–5.1)
Sodium: 137 mEq/L (ref 135–145)

## 2021-04-05 LAB — CBC WITH DIFFERENTIAL/PLATELET
Basophils Absolute: 0.1 10*3/uL (ref 0.0–0.1)
Basophils Relative: 0.7 % (ref 0.0–3.0)
Eosinophils Absolute: 0.2 10*3/uL (ref 0.0–0.7)
Eosinophils Relative: 2.6 % (ref 0.0–5.0)
HCT: 42.2 % (ref 39.0–52.0)
Hemoglobin: 14.2 g/dL (ref 13.0–17.0)
Lymphocytes Relative: 27.6 % (ref 12.0–46.0)
Lymphs Abs: 2.2 10*3/uL (ref 0.7–4.0)
MCHC: 33.6 g/dL (ref 30.0–36.0)
MCV: 97.6 fl (ref 78.0–100.0)
Monocytes Absolute: 0.6 10*3/uL (ref 0.1–1.0)
Monocytes Relative: 7.3 % (ref 3.0–12.0)
Neutro Abs: 5 10*3/uL (ref 1.4–7.7)
Neutrophils Relative %: 61.8 % (ref 43.0–77.0)
Platelets: 180 10*3/uL (ref 150.0–400.0)
RBC: 4.32 Mil/uL (ref 4.22–5.81)
RDW: 12.9 % (ref 11.5–15.5)
WBC: 8.2 10*3/uL (ref 4.0–10.5)

## 2021-04-05 LAB — HEPATIC FUNCTION PANEL
ALT: 34 U/L (ref 0–53)
AST: 47 U/L — ABNORMAL HIGH (ref 0–37)
Albumin: 4.1 g/dL (ref 3.5–5.2)
Alkaline Phosphatase: 110 U/L (ref 39–117)
Bilirubin, Direct: 0.3 mg/dL (ref 0.0–0.3)
Total Bilirubin: 1.3 mg/dL — ABNORMAL HIGH (ref 0.2–1.2)
Total Protein: 7.5 g/dL (ref 6.0–8.3)

## 2021-04-05 LAB — PROTIME-INR
INR: 1.1 ratio — ABNORMAL HIGH (ref 0.8–1.0)
Prothrombin Time: 12 s (ref 9.6–13.1)

## 2021-04-05 LAB — LIPASE: Lipase: 12 U/L (ref 11.0–59.0)

## 2021-04-05 LAB — GAMMA GT: GGT: 517 U/L — ABNORMAL HIGH (ref 7–51)

## 2021-04-05 NOTE — Patient Instructions (Signed)
If you are age 70 or older, your body mass index should be between 23-30. Your Body mass index is 26.97 kg/m. If this is out of the aforementioned range listed, please consider follow up with your Primary Care Provider.  If you are age 25 or younger, your body mass index should be between 19-25. Your Body mass index is 26.97 kg/m. If this is out of the aformentioned range listed, please consider follow up with your Primary Care Provider.   Your provider has requested that you go to the basement level for lab work before leaving today. Press "B" on the elevator. The lab is located at the first door on the left as you exit the elevator.  You have been scheduled for an abdominal ultrasound with elastography at [facility] Radiology (1st floor). Your appointment is scheduled for 04/10/21 at 10am. Please arrive 15 minutes prior to your scheduled appointment for registration purposes. Make certain not to have anything to eat or drink 6 hours prior to your procedure. Should you need to reschedule your appointment, you may contact radiology at [phone].  Liver Elastography Various chronic liver diseases such as hepatitis B, C, and fatty liver disease can lead to tissue damage and subsequent scar tissue formation. As the scar tissue accumulates, the liver loses some of its elasticity and becomes stiffer. Liver elastography involves the use of a surface ultrasound probe that delivers a low frequency pulse or shear wave to a small volume of liver tissue under the rib cage. The transmission of the sound wave is completely painless. How Is a Liver Elastography Performed? The liver is located in the right upper abdomen under the rib cage. Patients are asked to lie flat on an examination table. A technician places the FibroScan probe between the ribs on the right side of the lower chest wall. A series of 10 painless pulses are then applied to the liver. The results are recorded on the equipment and an overall  liver stiffness score is generated. This score is then interpreted by a qualified physician to predict the likelihood of advanced fibrosis or cirrhosis.  Patients are asked to wear loose clothing and should not consume any liquids or solids for a minimum of 4 hours before the test to increase the likelihood of obtaining reliable test results. The scan will take 10 to 15 minutes to complete, but patients should plan on being available for 30 minutes to allow time for preparation   The [facility] providers would like to encourage you to use [facility] to communicate with providers for non-urgent requests or questions.  Due to long hold times on the telephone, sending your provider a message by [facility] may be a faster and more efficient way to get a response.  Please allow 48 business hours for a response.  Please remember that this is for non-urgent requests.   Due to recent changes in healthcare laws, you may see the results of your imaging and laboratory studies on MyChart before your provider has had a chance to review them.  We understand that in some cases there may be results that are confusing or concerning to you. Not all laboratory results come back in the same time frame and the provider may be waiting for multiple results in order to interpret others.  Please give [place] 48 hours in order for your provider to thoroughly review all the results before contacting the office for clarification of your results.   It was a pleasure to see you today!  Thank you for trusting me with your gastrointestinal care!    Scott E. Candis Schatz, MD

## 2021-04-05 NOTE — Progress Notes (Signed)
HPI : Dustin Graham is a very pleasant 70 year old male with a long history of DM, who is referred to [place] by Hendricks Limes, FNP for further evaluation of possible cirrhosis.  The patient has been having bothersome upper abdominal pain for several months.  He underwent a RUQ in June which showed gallstones and a nodular contour to the liver, suggestive of cirrhosis.  It was unclear if his pain was gallstone related, and a subsequent HIDA scan was negative.  He did not undergo elective cholecystectomy, but in September presented to the ER with symptoms consistent with acute cholecystitis and underwent cholecystectomy which was uneventful.  He did have liver enzymes that were elevated in a mixed pattern with elevated tbili to 3.9 in the postoperative period. Prior to that time, he did have mildly elevated aminotransferases, AST predominant with normal bilirubin.  His most recent liver panel from October 26, shows AST 38, ALT 29, T bili 1.2. The patient has a history of longstanding heavy alcohol use.  Typically he will have 4-5 beers a day, as well as several shots of liquor.  He states that he feels like he needs to "have a buzz" in order to sleep.  He also has longstanding suboptimally controlled diabetes.  He had diabetes for about 30 years and has been on insulin since his diagnosis.  He denies any history of yellowing of his eyes or skin.  No history of abdominal swelling or leg swelling.  No problems with itching.  No difficulty with brain fog or episodes of confusion.  He denies being ever told he had liver problems in the past.  He has no known family history of liver disease. The patient states that he continues to have upper abdominal pain.  He has had this pain for years.  This pain improved for a while after his cholecystectomy, but seems to have returned.  Pain seems to be aggravated by movements and prolonged working at his desk.  Sometimes it is aggravated by food.  He will experience sharp pains  with twisting motions.  He denies symptoms of nausea or vomiting.  No symptoms of heartburn or acid regurgitation.  He typically has regular bowel movements but has had problems with constipation since his surgery Patient reports having a colonoscopy done greater than 10 years ago.  He has a Cologuard kit that was ordered for him at home, he has just not submitted the sample yet.  Past Medical History:  Diagnosis Date   GERD (gastroesophageal reflux disease)    HLD (hyperlipidemia)    Hypertension associated with diabetes ([place]) 02/29/2016   Rosacea 01/28/2019   Type 1 diabetes ([place])      Past Surgical History:  Procedure Laterality Date   broken nose     CHOLECYSTECTOMY     LUMBAR DISC SURGERY     fracture   Family History  Problem Relation Age of Onset   Diabetes Paternal Aunt    Social History   Tobacco Use   Smoking status: Never   Smokeless tobacco: Never  Vaping Use   Vaping Use: Never used  Substance Use Topics   Alcohol use: Yes    Alcohol/week: 3.0 - 4.0 standard drinks    Types: 3 - 4 Standard drinks or equivalent per week    Comment: several beers and a couple of shots per day   Drug use: Not Currently    Types: Marijuana   Current Outpatient Medications  Medication Sig Dispense Refill   Aspirin-Salicylamide-Caffeine (  BC HEADACHE POWDER PO) Take by mouth.     atorvastatin (LIPITOR) 40 MG tablet TAKE 1 TABLET BY MOUTH EVERY DAY 90 tablet 0   Continuous Blood Gluc Receiver (FREESTYLE LIBRE 2 READER) DEVI Use to test blood sugar up to 6 times daily. DX; E11.9 1 each 0   Continuous Blood Gluc Sensor (FREESTYLE LIBRE 2 SENSOR) MISC Use to test blood sugar up to 6 times daily. DX: E11.9 2 each 11   enalapril (VASOTEC) 20 MG tablet TAKE 1 TABLET BY MOUTH EVERY DAY 90 tablet 0   glucose blood (CONTOUR NEXT TEST) test strip Test 3 times a day. Dx. E11.9 100 each 3   insulin degludec (TRESIBA FLEXTOUCH) 200 UNIT/ML FlexTouch Pen Inject 20 Units into the skin daily. 9 mL  0   Insulin Pen Needle (BD PEN NEEDLE MICRO U/F) 32G X 6 MM MISC Use with injection 6 times daily Dx E10.9 600 each 3   NOVOLOG FLEXPEN 100 UNIT/ML FlexPen INJECT 30 UNITS INTO THE SKIN 3 (THREE) TIMES DAILY WITH MEALS. 15 mL 0   omeprazole (PRILOSEC) 20 MG capsule TAKE 1 CAPSULE BY MOUTH EVERY DAY 90 capsule 0   OneTouch Delica Lancets 17C MISC      No current facility-administered medications for this visit.   Allergies  Allergen Reactions   Codeine Nausea And Vomiting     Review of Systems: All systems reviewed and negative except where noted in HPI.    No results found.  Physical Exam: BP 118/68 (BP Location: Left Arm, Patient Position: Sitting, Cuff Size: Normal)   Pulse (!) 108   Ht 5' 11.5" (1.816 m)   Wt 196 lb 2 oz (89 kg)   BMI 26.97 kg/m  Constitutional: Pleasant,well-developed, Caucasian male in no acute distress. HEENT: Normocephalic and atraumatic. Conjunctivae are normal. No scleral icterus. Cardiovascular: Normal rate, regular rhythm.  Pulmonary/chest: Effort normal and breath sounds normal. No wheezing, rales or rhonchi. Abdominal: Soft, nondistended, nontender. Bowel sounds active throughout. There are no masses palpable. No hepatomegaly. Extremities: no edema Neurological: Alert and oriented to person place and time.  No asterixis Skin: Not jaundiced, skin is warm and dry. No rashes noted.  Facial telangiectasias present Psychiatric: Normal mood and affect. Behavior is normal.  CBC    Component Value Date/Time   WBC 6.1 07/22/2020 0916   RBC 3.93 (L) 07/22/2020 0916   HGB 13.7 07/22/2020 0916   HCT 39.5 07/22/2020 0916   PLT 171 07/22/2020 0916   MCV 101 (H) 07/22/2020 0916   MCH 34.9 (H) 07/22/2020 0916   MCHC 34.7 07/22/2020 0916   RDW 12.6 07/22/2020 0916   LYMPHSABS 2.1 07/22/2020 0916   EOSABS 0.2 07/22/2020 0916   BASOSABS 0.1 07/22/2020 0916    CMP     Component Value Date/Time   NA 138 03/22/2021 1421   K 5.3 (H) 03/22/2021 1421    CL 99 03/22/2021 1421   CO2 24 03/22/2021 1421   GLUCOSE 150 (H) 03/22/2021 1421   BUN 7 (L) 03/22/2021 1421   CREATININE 0.66 (L) 03/22/2021 1421   CALCIUM 9.7 03/22/2021 1421   PROT 7.5 03/22/2021 1421   ALBUMIN 4.2 03/22/2021 1421   AST 38 03/22/2021 1421   ALT 29 03/22/2021 1421   ALKPHOS 134 (H) 03/22/2021 1421   BILITOT 1.2 03/22/2021 1421   GFRNONAA 102 07/22/2020 0916   GFRAA 118 07/22/2020 0916    Ref Range & Units 1 mo ago  Sodium 135 - 145 mmol/L 130 Low  Potassium 3.5 - 5.0 mmol/L 4.6   Chloride 98 - 107 mmol/L 97 Low    CO2 21.0 - 32.0 mmol/L 23.8   Anion Gap 3 - 11 mmol/L 9   BUN 8 - 20 mg/dL 14   Creatinine 0.80 - 1.30 mg/dL 0.83   BUN/Creatinine Ratio  17   eGFR CKD-EPI (2021) Male >=60 mL/min/1.72m >90   Comment: eGFR calculated with CKD-EPI 2021 equation in accordance with [employer] [place] [employer] Ne[employer] Task Force recommendations.  Glucose 70 - 179 mg/dL 351 High    Calcium 8.5 - 10.1 mg/dL 8.7   Albumin 3.5 - 5.0 g/dL 2.7 Low    Total Protein 6.0 - 8.0 g/dL 6.6   Total Bilirubin 0.3 - 1.2 mg/dL 3.9 High    AST 15 - 40 U/L 116 High    ALT 12 - 78 U/L 201 High    Alkaline Phosphatase 46 - 116 U/L 171 High     CLINICAL DATA:  Worsening chronic right upper quadrant pain.   EXAM: ULTRASOUND ABDOMEN LIMITED RIGHT UPPER QUADRANT   COMPARISON:  None.   FINDINGS: Gallbladder:   Multiple gallbladder stones measuring up to 7 mm. The gallbladder wall measures upper limits of normal at 3 mm. No pericholecystic fluid or gallbladder dilation visualized. No sonographic Murphy sign noted by sonographer.   Common bile duct:   Diameter: 6 mm   Liver:   No focal lesion identified. Coarsened hepatic echotexture with diffusely increased echogenicity and subtle contour nodularity. Portal vein is patent on color Doppler imaging with normal direction of blood flow towards the liver.   Other: None.   IMPRESSION: 1.  Cholelithiasis without sonographic evidence of acute cholecystitis. Consider further evaluation with nuclear medicine HIDA scan to assess for chronic cholecystitis/biliary dyskinesia if clinically indicated. 2. Coarsened hepatic echotexture with subtle contour nodularity, suspicious for cirrhosis. Recommend correlation with clinical history and laboratory values.     Electronically Signed   By: JDahlia BailiffMD   On: 10/26/2020 10:35    CLINICAL DATA:  Right upper quadrant abdominal pain, history of cholelithiasis   EXAM: NUCLEAR MEDICINE HEPATOBILIARY IMAGING WITH GALLBLADDER EF   TECHNIQUE: Sequential images of the abdomen were obtained out to 60 minutes following intravenous administration of radiopharmaceutical. After oral ingestion of Ensure, gallbladder ejection fraction was determined. At 60 min, normal ejection fraction is greater than 33%.   RADIOPHARMACEUTICALS:  5.4 mCi Tc-933mCholetec IV   COMPARISON:  None.   FINDINGS: Prompt uptake and biliary excretion of activity by the liver is seen. Gallbladder activity is visualized, consistent with patency of cystic duct. Biliary activity passes into small bowel, consistent with patent common bile duct.   Calculated gallbladder ejection fraction is 94%. (Normal gallbladder ejection fraction with Ensure is greater than 33%.)   IMPRESSION: Normal uptake and excretion of biliary tracer. Normal gallbladder ejection fraction.     Electronically Signed   By: MaInez Catalina.D.   On: 11/08/2020 15:34    Colonoscopy:  >10 years:  Has home stool test (Cologuard)  ASSESSMENT AND PLAN: [age]ear old male found to have cirrhotic appearing liver on ultrasound as part of work-up for upper abdominal pain, subsequently experienced acute cholecystitis and underwent cholecystectomy resulting in a bump in his liver enzymes and T bili up to 3.9 (unable to find surgery reported to see if he had a cirrhotic appearing liver  laparoscopically) with subsequent provement of his bilirubin to 1.2.  He has normal platelets and there is  no mention of any findings of portal hypertension on his CT scan prior to surgery.  Edema certainly has cirrhosis, and his reported drinking history would likely be enough to cause cirrhosis by itself.  However his diabetes probably also has contributed as well.  We discussed the pathophysiology of cirrhosis and the implications of that diagnosis on his life expectancy we discussed how cirrhosis is typically irreversible and the only cure would be a liver transplant.  We discussed the various complications that can develop from cirrhosis to include ascites/SBP, esophageal varices and hepatic encephalopathy among many others.  We will get elastography to confirm cirrhosis and exclude other causes of chronic liver disease.  We will schedule him for an upper endoscopy for variceal screening, but also to evaluate his chronic upper abdominal pain.  Patient is aware that continued alcohol use will accelerate the demise of his liver and significantly shorten his life expectancy.  He is aware that abstinence from alcohol is the only way to maximize the lifespan of his liver and reduce chances of complications from cirrhosis.  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, alcoholic -Exclude viral, metabolic and autoimmune causes of chronic liver disease -Baseline INR -Elastography -EGD for variceal screening - Alcohol abstinence - Follow-up in clinic after all tests/procedures complete  Epigastric pain -EGD to evaluate for gastritis/peptic ulcer disease  Colon cancer screening - Okay with patient proceeding with Cologuard, we can add colonoscopy to his EGD if it is positive  The details, risks (including bleeding, perforation, infection, missed lesions, medication  reactions and possible hospitalization or surgery if complications occur), benefits, and alternatives to_0 EGD with possible biopsy and possible dilation were discussed with the patient and he consents to proceed.   Scott E. Candis Schatz, MD [facility]   CC: Loman Brooklyn, FNP

## 2021-04-10 ENCOUNTER — Encounter (HOSPITAL_COMMUNITY): Payer: Self-pay

## 2021-04-10 ENCOUNTER — Ambulatory Visit (HOSPITAL_COMMUNITY): Payer: Medicare Other

## 2021-04-10 LAB — CERULOPLASMIN: Ceruloplasmin: 27 mg/dL (ref 18–36)

## 2021-04-10 LAB — IGG: IgG (Immunoglobin G), Serum: 1213 mg/dL (ref 600–1540)

## 2021-04-10 LAB — ANTI-SMOOTH MUSCLE ANTIBODY, IGG: Actin (Smooth Muscle) Antibody (IGG): <20 U (ref <20)

## 2021-04-10 LAB — HEPATITIS C ANTIBODY
Hepatitis C Ab: NONREACTIVE
SIGNAL TO CUT-OFF: 0.2 (ref <1.00)

## 2021-04-10 LAB — ANA: Anti Nuclear Antibody (ANA): NEGATIVE

## 2021-04-10 LAB — HEPATITIS B SURFACE ANTIBODY,QUALITATIVE: Hep B S Ab: NONREACTIVE

## 2021-04-10 LAB — HEPATITIS B SURFACE ANTIGEN: Hepatitis B Surface Ag: NONREACTIVE

## 2021-04-10 LAB — ALPHA-1-ANTITRYPSIN: A-1 Antitrypsin, Ser: 148 mg/dL (ref 83–199)

## 2021-04-10 LAB — MITOCHONDRIAL ANTIBODIES: Mitochondrial M2 Ab, IgG: <=20 U (ref <=20.0)

## 2021-04-11 ENCOUNTER — Other Ambulatory Visit: Payer: Self-pay | Admitting: Family Medicine

## 2021-04-11 DIAGNOSIS — E1159 Type 2 diabetes mellitus with other circulatory complications: Secondary | ICD-10-CM

## 2021-04-12 NOTE — Progress Notes (Signed)
Mr. Mancil, all the tests for other causes of chronic liver disease were negative, confirming suspicion that your liver disease is most likely from chronic alcohol use.  Your GGT level was quite elevated, which is typical for alcoholic liver disease.  It is very important that decrease your alcohol consumption immediately, and then completely stop all alcohol consumption in order to avoid further liver damage.  Please proceed with the elastography testing as we discussed

## 2021-04-21 ENCOUNTER — Other Ambulatory Visit: Payer: Self-pay | Admitting: Family Medicine

## 2021-04-21 DIAGNOSIS — I152 Hypertension secondary to endocrine disorders: Secondary | ICD-10-CM

## 2021-04-21 DIAGNOSIS — E1159 Type 2 diabetes mellitus with other circulatory complications: Secondary | ICD-10-CM

## 2021-04-24 ENCOUNTER — Other Ambulatory Visit: Payer: Self-pay | Admitting: Family Medicine

## 2021-04-24 DIAGNOSIS — E1159 Type 2 diabetes mellitus with other circulatory complications: Secondary | ICD-10-CM

## 2021-05-15 ENCOUNTER — Other Ambulatory Visit: Payer: Self-pay

## 2021-05-15 ENCOUNTER — Encounter: Payer: Self-pay | Admitting: Gastroenterology

## 2021-05-15 ENCOUNTER — Ambulatory Visit (HOSPITAL_COMMUNITY)
Admission: RE | Admit: 2021-05-15 | Discharge: 2021-05-15 | Disposition: A | Discharge status: Home / Self Care | Payer: Medicare Other | Source: Ambulatory Visit | Attending: Gastroenterology | Admitting: Gastroenterology

## 2021-05-15 DIAGNOSIS — K709 Alcoholic liver disease, unspecified: Secondary | ICD-10-CM | POA: Diagnosis present

## 2021-05-15 DIAGNOSIS — R1013 Epigastric pain: Secondary | ICD-10-CM

## 2021-05-15 NOTE — Progress Notes (Signed)
Dustin Graham,  Your elastography test showed that you do not have any significant scarring of fibrosis of the liver, which is surprising based on your labs and the prior ultrasound report  This is reassuring, but I would still strongly recommend you cut back or eliminate alcohol use to reduce risk of significant liver disease in the future.  The elastography test is typically accurate for assessing scarring of the liver, but it is not 100% which I why still recommend you still limit alcohol use.  We can discuss further at your upcoming EGD appointment.

## 2021-05-23 ENCOUNTER — Other Ambulatory Visit: Payer: Self-pay | Admitting: Family Medicine

## 2021-05-23 DIAGNOSIS — E782 Mixed hyperlipidemia: Secondary | ICD-10-CM

## 2021-05-23 DIAGNOSIS — E1159 Type 2 diabetes mellitus with other circulatory complications: Secondary | ICD-10-CM

## 2021-05-24 ENCOUNTER — Telehealth: Payer: Self-pay | Admitting: Gastroenterology

## 2021-05-24 ENCOUNTER — Encounter: Payer: Medicare Other | Admitting: Gastroenterology

## 2021-06-23 ENCOUNTER — Ambulatory Visit (INDEPENDENT_AMBULATORY_CARE_PROVIDER_SITE_OTHER): Payer: Medicare Other | Admitting: Family Medicine

## 2021-06-23 ENCOUNTER — Encounter: Payer: Self-pay | Admitting: Family Medicine

## 2021-06-23 ENCOUNTER — Other Ambulatory Visit: Payer: Self-pay | Admitting: Family Medicine

## 2021-06-23 ENCOUNTER — Ambulatory Visit (INDEPENDENT_AMBULATORY_CARE_PROVIDER_SITE_OTHER): Payer: Medicare Other

## 2021-06-23 VITALS — BP 135/72 | HR 99 | Temp 98.0°F | Ht 71.5 in | Wt 195.8 lb

## 2021-06-23 DIAGNOSIS — E1159 Type 2 diabetes mellitus with other circulatory complications: Secondary | ICD-10-CM

## 2021-06-23 DIAGNOSIS — E1065 Type 1 diabetes mellitus with hyperglycemia: Secondary | ICD-10-CM

## 2021-06-23 DIAGNOSIS — K219 Gastro-esophageal reflux disease without esophagitis: Secondary | ICD-10-CM

## 2021-06-23 DIAGNOSIS — Z5982 Transportation insecurity: Secondary | ICD-10-CM

## 2021-06-23 DIAGNOSIS — R1011 Right upper quadrant pain: Secondary | ICD-10-CM

## 2021-06-23 DIAGNOSIS — M25512 Pain in left shoulder: Secondary | ICD-10-CM

## 2021-06-23 DIAGNOSIS — Z0000 Encounter for general adult medical examination without abnormal findings: Secondary | ICD-10-CM

## 2021-06-23 DIAGNOSIS — E782 Mixed hyperlipidemia: Secondary | ICD-10-CM | POA: Diagnosis not present

## 2021-06-23 DIAGNOSIS — I152 Hypertension secondary to endocrine disorders: Secondary | ICD-10-CM

## 2021-06-23 LAB — BAYER DCA HB A1C WAIVED: HB A1C (BAYER DCA - WAIVED): 6.7 % — ABNORMAL HIGH (ref 4.8–5.6)

## 2021-06-23 MED ORDER — OMEPRAZOLE 20 MG PO CPDR: 20.0000 mg | DELAYED_RELEASE_CAPSULE | Freq: Every day | ORAL | 1 refills | Status: DC

## 2021-06-23 MED ORDER — TRESIBA FLEXTOUCH 200 UNIT/ML ~~LOC~~ SOPN: 18.0000 [IU] | PEN_INJECTOR | Freq: Two times a day (BID) | SUBCUTANEOUS | 0 refills | Status: DC

## 2021-06-23 MED ORDER — DICLOFENAC SODIUM 75 MG PO TBEC
75.0000 mg | DELAYED_RELEASE_TABLET | Freq: Two times a day (BID) | ORAL | 1 refills | Status: DC
Start: 2021-06-23 — End: 2021-07-28

## 2021-06-23 NOTE — Telephone Encounter (Signed)
Refill from [facility] was set to no print

## 2021-06-23 NOTE — Patient Instructions (Addendum)
Get Shingrix at the pharmacy.  Complete your Cologuard.  Schedule your diabetic eye exam.    Gastroenterology/Endoscopy Phone: [phone]

## 2021-06-23 NOTE — Progress Notes (Signed)
Assessment & Plan:  1. Controlled type 1 diabetes mellitus with hyperglycemia (HCC) Lab Results  Component Value Date   HGBA1C 6.7 (H) 06/23/2021   HGBA1C 6.9 12/21/2020   HGBA1C 6.5 07/22/2020    - Diabetes is at goal of A1c < 7. - Medications: continue current medications - Home glucose monitoring: continue using the freestyle libre   - Patient is currently taking a statin. Patient is taking an ACE-inhibitor/ARB.  - Instruction/counseling given: reminded to get eye exam  Diabetes Health Maintenance Due  Topic Date Due   OPHTHALMOLOGY EXAM  06/09/2020   FOOT EXAM  07/22/2021   HEMOGLOBIN A1C  12/21/2021    No results found for: LABMICR, MICROALBUR - Lipid panel - CBC with Differential/Platelet - CMP14+EGFR - Bayer DCA Hb A1c Waived  2. Hypertension associated with diabetes ([place]) Well controlled on current regimen.  - Lipid panel - CBC with Differential/Platelet - CMP14+EGFR  3. Mixed hyperlipidemia Well controlled on current regimen.  - Lipid panel - CMP14+EGFR  4. Acute pain of left shoulder Shoulder exercises provided for patient to complete at home. Started diclofenac for pain. X-ray today. Advised patient to return in 6 weeks if pain does not improve for further assessment with MRI. - diclofenac (VOLTAREN) 75 MG EC tablet; Take 1 tablet (75 mg total) by mouth 2 (two) times daily.  Dispense: 30 tablet; Refill: 1 - DG Shoulder Left  5. Lack of access to transportation E-mail sent to [facility] transportation services with patient's information.   6. Gastroesophageal reflux disease, unspecified whether esophagitis present Well controlled on current regimen.  - omeprazole (PRILOSEC) 20 MG capsule; Take 1 capsule (20 mg total) by mouth daily.  Dispense: 90 capsule; Refill: 1  7. RUQ abdominal pain Patient to schedule EGD with gastroenterology to further work-up his pain.  8. Health maintenance Patient to get Shingrix at the pharmacy.    Return in about 3  months (around 09/21/2021) for follow-up of chronic medication conditions.  Hendricks Limes, MSN, APRN, FNP-C [facility]   Subjective:    Patient ID: Dustin Graham, male    DOB: Jan 10, 1951, 71 y.o.   MRN: [id number]  Patient Care Team: Loman Brooklyn, FNP as PCP - General (Family Medicine) Lavera Guise, [facility] (Pharmacist) Okey Regal, OD (Optometry)   Chief Complaint:  Chief Complaint  Patient presents with   Medical Management of Chronic Issues   Abdominal Pain    Has gallbladder taken out x 1 year ago due to abd pain but patient is still having pain.    Shoulder Pain    Left x 6 weeks     HPI: Dustin Graham is a 71 y.o. male presenting on 06/23/2021 for Medical Management of Chronic Issues, Abdominal Pain (Has gallbladder taken out x 1 year ago due to abd pain but patient is still having pain. ), and Shoulder Pain (Left x 6 weeks )  Type 1 Diabetes: Patient presents for follow up of diabetes. Current symptoms include: hyperglycemia. Known diabetic complications: none. Medication compliance: currently taking Tresiba 18 units twice daily and using his Novolog with meals based on what he is eating (up to 22 units). Current diet: not asked. Home blood sugar records:  uses [place] which is active 99% of the time.  Is he  on ACE inhibitor or angiotensin II receptor blocker? Yes (enalapril). Is he on a statin? Yes (atorvastatin).   Hypertension: patient does not check BP at home.   New Complaints: Patient has continued to  have right upper quadrant abdominal pain. The pain has persisted since having his gallbladder removed. He has had an abdominal CT and ultrasound which showed hepatic cirrhosis. He was referred to gastroenterology at our last visit. He did see them on 04/05/2021 and was scheduled to have an EGD which he had to cancel as his daughter that was going to take him came down with COVID. This was a month ago. He has not reached back out to reschedule due  to transportation issues.   Patient reports left shoulder pain x6 weeks. He describes sharp pain that is activated by movement. The throbbing pain in his upper arm will wake him from sleep. No known injury, fall, or unusal activity. It is getting worse. He has been taking either aspirin or a combination of Tylenol and Ibuprofen. This has occurred in the past. No previous imaging.    Social history:  Relevant past medical, surgical, family and social history reviewed and updated as indicated. Interim medical history since our last visit reviewed.  Allergies and medications reviewed and updated.  DATA REVIEWED: CHART IN EPIC  ROS: Negative unless specifically indicated above in HPI.    Current Outpatient Medications:    Aspirin-Salicylamide-Caffeine (BC HEADACHE POWDER PO), Take by mouth., Disp: , Rfl:    atorvastatin (LIPITOR) 40 MG tablet, TAKE 1 TABLET BY MOUTH EVERY DAY, Disp: 90 tablet, Rfl: 0   Continuous Blood Gluc Receiver (FREESTYLE LIBRE 2 READER) DEVI, Use to test blood sugar up to 6 times daily. DX; E11.9, Disp: 1 each, Rfl: 0   Continuous Blood Gluc Sensor (FREESTYLE LIBRE 2 SENSOR) MISC, Use to test blood sugar up to 6 times daily. DX: E11.9, Disp: 2 each, Rfl: 11   enalapril (VASOTEC) 20 MG tablet, TAKE 1 TABLET BY MOUTH EVERY DAY, Disp: 90 tablet, Rfl: 0   glucose blood (CONTOUR NEXT TEST) test strip, Test 3 times a day. Dx. E11.9, Disp: 100 each, Rfl: 3   insulin aspart (NOVOLOG FLEXPEN) 100 UNIT/ML FlexPen, Inject 30 Units into the skin 3 (three) times daily with meals., Disp: 90 mL, Rfl: 0   insulin degludec (TRESIBA FLEXTOUCH) 200 UNIT/ML FlexTouch Pen, Inject 20 Units into the skin daily., Disp: 9 mL, Rfl: 0   Insulin Pen Needle (BD PEN NEEDLE MICRO U/F) 32G X 6 MM MISC, Use with injection 6 times daily Dx E10.9, Disp: 600 each, Rfl: 3   omeprazole (PRILOSEC) 20 MG capsule, TAKE 1 CAPSULE BY MOUTH EVERY DAY, Disp: 90 capsule, Rfl: 0   OneTouch Delica Lancets 95M MISC, ,  Disp: , Rfl:    Allergies  Allergen Reactions   Codeine Nausea And Vomiting   Past Medical History:  Diagnosis Date   GERD (gastroesophageal reflux disease)    HLD (hyperlipidemia)    Hypertension associated with diabetes ([place]) 02/29/2016   Rosacea 01/28/2019   Type 1 diabetes ([place])     Past Surgical History:  Procedure Laterality Date   broken nose     CHOLECYSTECTOMY     GALLBLADDER SURGERY     LUMBAR DISC SURGERY     fracture    Social History   Socioeconomic History   Marital status: Divorced    Spouse name: Not on file   Number of children: 1   Years of education: 12   Highest education level: Some college, no degree  Occupational History    Employer: [employer]   Occupation: IT trainer    Comment: [employer]  Tobacco Use   Smoking status:  Never   Smokeless tobacco: Never  Vaping Use   Vaping Use: Never used  Substance and Sexual Activity   Alcohol use: Yes    Alcohol/week: 3.0 - 4.0 standard drinks    Types: 3 - 4 Standard drinks or equivalent per week    Comment: several beers and a couple of shots per day   Drug use: Not Currently    Types: Marijuana   Sexual activity: Not on file  Other Topics Concern   Not on file  Social History Narrative   Lives alone   Social Determinants of Health   Financial Resource Strain: Low Risk    Difficulty of Paying Living Expenses: Not hard at all  Food Insecurity: No Food Insecurity   Worried About Charity fundraiser in the Last Year: Never true   Ran Out of Food in the Last Year: Never true  Transportation Needs: No Transportation Needs   Lack of Transportation (Medical): No   Lack of Transportation (Non-Medical): No  Physical Activity: Inactive   Days of Exercise per Week: 0 days   Minutes of Exercise per Session: 0 min  Stress: Stress Concern Present   Feeling of Stress : To some extent  Social Connections: Moderately Integrated   Frequency of Communication with Friends and  Family: More than three times a week   Frequency of Social Gatherings with Friends and Family: More than three times a week   Attends Religious Services: 1 to 4 times per year   Active Member of [employer] or Organizations: Yes   Attends Archivist Meetings: 1 to 4 times per year   Marital Status: Divorced  Human resources officer Violence: Not At Risk   Fear of Current or Ex-Partner: No   Emotionally Abused: No   Physically Abused: No   Sexually Abused: No        Objective:    BP 135/72    Pulse 99    Temp 98 F (36.7 C) (Temporal)    Ht 5' 11.5" (1.816 m)    Wt 195 lb 12.8 oz (88.8 kg)    SpO2 96%    BMI 26.93 kg/m   Wt Readings from Last 3 Encounters:  06/23/21 195 lb 12.8 oz (88.8 kg)  04/05/21 196 lb 2 oz (89 kg)  03/22/21 192 lb 12.8 oz (87.5 kg)    Physical Exam Vitals reviewed.  Constitutional:      General: He is not in acute distress.    Appearance: Normal appearance. He is normal weight. He is not ill-appearing, toxic-appearing or diaphoretic.  HENT:     Head: Normocephalic and atraumatic.  Eyes:     General: No scleral icterus.       Right eye: No discharge.        Left eye: No discharge.     Conjunctiva/sclera: Conjunctivae normal.  Cardiovascular:     Rate and Rhythm: Normal rate and regular rhythm.     Heart sounds: Normal heart sounds. No murmur heard.   No friction rub. No gallop.  Pulmonary:     Effort: Pulmonary effort is normal. No respiratory distress.     Breath sounds: Normal breath sounds. No stridor. No wheezing, rhonchi or rales.  Musculoskeletal:     Left shoulder: No swelling or deformity. Decreased range of motion (cannot raise > 90 degrees, pain putting arm behind back; + empty can test).     Cervical back: Normal range of motion.  Skin:    General: Skin is warm and  dry.  Neurological:     Mental Status: He is alert and oriented to person, place, and time. Mental status is at baseline.  Psychiatric:        Mood and Affect: Mood normal.         Behavior: Behavior normal.        Thought Content: Thought content normal.        Judgment: Judgment normal.    No results found for: TSH Lab Results  Component Value Date   WBC 8.2 04/05/2021   HGB 14.2 04/05/2021   HCT 42.2 04/05/2021   MCV 97.6 04/05/2021   PLT 180.0 04/05/2021   Lab Results  Component Value Date   NA 137 04/05/2021   K 4.2 04/05/2021   CO2 26 04/05/2021   GLUCOSE 138 (H) 04/05/2021   BUN 9 04/05/2021   CREATININE 0.66 04/05/2021   BILITOT 1.3 (H) 04/05/2021   ALKPHOS 110 04/05/2021   AST 47 (H) 04/05/2021   ALT 34 04/05/2021   PROT 7.5 04/05/2021   ALBUMIN 4.1 04/05/2021   CALCIUM 9.5 04/05/2021   EGFR 102 03/22/2021   GFR 95.45 04/05/2021   Lab Results  Component Value Date   CHOL 192 03/22/2021   Lab Results  Component Value Date   HDL 89 03/22/2021   Lab Results  Component Value Date   LDLCALC 91 03/22/2021   Lab Results  Component Value Date   TRIG 64 03/22/2021   Lab Results  Component Value Date   CHOLHDL 2.2 03/22/2021   Lab Results  Component Value Date   HGBA1C 6.9 12/21/2020

## 2021-06-24 LAB — CMP14+EGFR
ALT: 24 IU/L (ref 0–44)
AST: 27 IU/L (ref 0–40)
Albumin/Globulin Ratio: 1.3 (ref 1.2–2.2)
Albumin: 3.9 g/dL (ref 3.8–4.8)
Alkaline Phosphatase: 96 IU/L (ref 44–121)
BUN/Creatinine Ratio: 15 (ref 10–24)
BUN: 11 mg/dL (ref 8–27)
Bilirubin Total: 1.3 mg/dL — ABNORMAL HIGH (ref 0.0–1.2)
CO2: 25 mmol/L (ref 20–29)
Calcium: 9.4 mg/dL (ref 8.6–10.2)
Chloride: 102 mmol/L (ref 96–106)
Creatinine, Ser: 0.73 mg/dL — ABNORMAL LOW (ref 0.76–1.27)
Globulin, Total: 2.9 g/dL (ref 1.5–4.5)
Glucose: 204 mg/dL — ABNORMAL HIGH (ref 70–99)
Potassium: 4.4 mmol/L (ref 3.5–5.2)
Sodium: 139 mmol/L (ref 134–144)
Total Protein: 6.8 g/dL (ref 6.0–8.5)
eGFR: 98 mL/min/{1.73_m2} (ref >59)

## 2021-06-24 LAB — CBC WITH DIFFERENTIAL/PLATELET
Basophils Absolute: 0.1 10*3/uL (ref 0.0–0.2)
Basos: 1 %
EOS (ABSOLUTE): 0.2 10*3/uL (ref 0.0–0.4)
Eos: 4 %
Hematocrit: 40.4 % (ref 37.5–51.0)
Hemoglobin: 14 g/dL (ref 13.0–17.7)
Immature Grans (Abs): 0 10*3/uL (ref 0.0–0.1)
Immature Granulocytes: 0 %
Lymphocytes Absolute: 1.5 10*3/uL (ref 0.7–3.1)
Lymphs: 30 %
MCH: 32.6 pg (ref 26.6–33.0)
MCHC: 34.7 g/dL (ref 31.5–35.7)
MCV: 94 fL (ref 79–97)
Monocytes Absolute: 0.4 10*3/uL (ref 0.1–0.9)
Monocytes: 8 %
Neutrophils Absolute: 2.8 10*3/uL (ref 1.4–7.0)
Neutrophils: 57 %
Platelets: 169 10*3/uL (ref 150–450)
RBC: 4.3 x10E6/uL (ref 4.14–5.80)
RDW: 12.2 % (ref 11.6–15.4)
WBC: 5 10*3/uL (ref 3.4–10.8)

## 2021-06-24 LAB — LIPID PANEL
Chol/HDL Ratio: 2.4 ratio (ref 0.0–5.0)
Cholesterol, Total: 158 mg/dL (ref 100–199)
HDL: 65 mg/dL (ref >39)
LDL Chol Calc (NIH): 77 mg/dL (ref 0–99)
Triglycerides: 85 mg/dL (ref 0–149)
VLDL Cholesterol Cal: 16 mg/dL (ref 5–40)

## 2021-06-25 ENCOUNTER — Other Ambulatory Visit: Payer: Self-pay | Admitting: Family Medicine

## 2021-06-25 ENCOUNTER — Encounter: Payer: Self-pay | Admitting: Family Medicine

## 2021-06-25 DIAGNOSIS — E1159 Type 2 diabetes mellitus with other circulatory complications: Secondary | ICD-10-CM

## 2021-06-28 ENCOUNTER — Telehealth: Payer: Self-pay | Admitting: Family Medicine

## 2021-06-28 DIAGNOSIS — E1159 Type 2 diabetes mellitus with other circulatory complications: Secondary | ICD-10-CM

## 2021-06-28 MED ORDER — TRESIBA FLEXTOUCH 200 UNIT/ML ~~LOC~~ SOPN: 18.0000 [IU] | PEN_INJECTOR | Freq: Two times a day (BID) | SUBCUTANEOUS | 0 refills | Status: DC

## 2021-06-28 NOTE — Telephone Encounter (Signed)
Pt aware previous attempts to fill script ended up on No Print, this mornings did go through electronically

## 2021-07-06 ENCOUNTER — Encounter: Payer: Self-pay | Admitting: *Deleted

## 2021-07-10 ENCOUNTER — Telehealth: Payer: Self-pay | Admitting: Gastroenterology

## 2021-07-10 NOTE — Telephone Encounter (Signed)
Called patient and let him know to take half of his morning dose for his Dustin Graham and not to take his morning dose. Novolog not to take evening does the day before or morning dose the day of his procedure.

## 2021-07-10 NOTE — Telephone Encounter (Signed)
Inbound call from patient requesting a call back to discuss medication he can take the day of procedure as far as insulin and blood pressure.

## 2021-07-13 ENCOUNTER — Ambulatory Visit (AMBULATORY_SURGERY_CENTER): Payer: Medicare Other | Admitting: Gastroenterology

## 2021-07-13 ENCOUNTER — Encounter: Payer: Self-pay | Admitting: Gastroenterology

## 2021-07-13 VITALS — BP 127/67 | HR 92 | Temp 98.6°F | Resp 14 | Ht 71.0 in | Wt 196.0 lb

## 2021-07-13 DIAGNOSIS — R1013 Epigastric pain: Secondary | ICD-10-CM

## 2021-07-13 DIAGNOSIS — K319 Disease of stomach and duodenum, unspecified: Secondary | ICD-10-CM | POA: Diagnosis not present

## 2021-07-13 MED ORDER — SODIUM CHLORIDE 0.9 % IV SOLN: 500.0000 mL | Freq: Once | INTRAVENOUS | Status: DC

## 2021-07-13 NOTE — Patient Instructions (Signed)
Await pathology results.   Follow up with Dr Candis Schatz as needed in clinic   [place]:   Refer to the procedure report that was given to you for any specific questions about what was found during the examination.  If the procedure report does not answer your questions, please call your gastroenterologist to clarify.  If you requested that your care partner not be given the details of your procedure findings, then the procedure report has been included in a sealed envelope for you to review at your convenience later.  YOU SHOULD EXPECT: Some feelings of bloating in the abdomen. Passage of more gas than usual.  Walking can help get rid of the air that was put into your GI tract during the procedure and reduce the bloating. If you had a lower endoscopy (such as a colonoscopy or flexible sigmoidoscopy) you may notice spotting of blood in your stool or on the toilet paper. If you underwent a bowel prep for your procedure, you may not have a normal bowel movement for a few days.  Please Note:  You might notice some irritation and congestion in your nose or some drainage.  This is from the oxygen used during your procedure.  There is no need for concern and it should clear up in a day or so.  SYMPTOMS TO REPORT IMMEDIATELY:  Following upper endoscopy (EGD)  Vomiting of blood or coffee ground material  New chest pain or pain under the shoulder blades  Painful or persistently difficult swallowing  New shortness of breath  Fever of 100F or higher  Black, tarry-looking stools  For urgent or emergent issues, a gastroenterologist can be reached at any hour by calling [phone]. Do not use MyChart messaging for urgent concerns.    DIET:  We do recommend a small meal at first, but then you may proceed to your regular diet.  Drink plenty of fluids but you should avoid alcoholic beverages for 24 hours.  ACTIVITY:  You should plan to take it  easy for the rest of today and you should NOT DRIVE or use heavy machinery until tomorrow (because of the sedation medicines used during the test).    FOLLOW UP: Our staff will call the number listed on your records 48-72 hours following your procedure to check on you and address any questions or concerns that you may have regarding the information given to you following your procedure. If we do not reach you, we will leave a message.  We will attempt to reach you two times.  During this call, we will ask if you have developed any symptoms of COVID 19. If you develop any symptoms (ie: fever, flu-like symptoms, shortness of breath, cough etc.) before then, please call [phone].  If you test positive for Covid 19 in the 2 weeks post procedure, please call and report this information to [place].    If any biopsies were taken you will be contacted by phone or by letter within the next 1-3 weeks.  Please call us at [phone] if you have not heard about the biopsies in 3 weeks.    SIGNATURES/CONFIDENTIALITY: You and/or your care partner have signed paperwork which will be entered into your electronic medical record.  These signatures attest to the fact that that the information above on your After Visit Summary has been reviewed and is understood.  Full responsibility of the confidentiality of this discharge information lies with you and/or your  care-partner.

## 2021-07-13 NOTE — Op Note (Signed)
[facility] Patient Name: Dustin Graham Procedure Date: 07/13/2021 10:29 AM MRN: [id number] Endoscopist: Nicki Reaper E. Candis Schatz , MD Age: 71 Referring MD:  Date of Birth: Feb 03, 1951 Gender: Male Account #: [id number] Procedure:                Upper GI endoscopy Indications:              Epigastric abdominal pain Medicines:                Monitored Anesthesia Care Procedure:                Pre-Anesthesia Assessment:                           - Prior to the procedure, a History and Physical                            was performed, and patient medications and                            allergies were reviewed. The patient's tolerance of                            previous anesthesia was also reviewed. The risks                            and benefits of the procedure and the sedation                            options and risks were discussed with the patient.                            All questions were answered, and informed consent                            was obtained. Prior Anticoagulants: The patient has                            taken no previous anticoagulant or antiplatelet                            agents. ASA Grade Assessment: II - A patient with                            mild systemic disease. After reviewing the risks                            and benefits, the patient was deemed in                            satisfactory condition to undergo the procedure.                           After obtaining informed consent, the endoscope was  passed under direct vision. Throughout the                            procedure, the patient's blood pressure, pulse, and                            oxygen saturations were monitored continuously. The                            Endoscope was introduced through the mouth, and                            advanced to the third part of duodenum. The upper                            GI endoscopy was  accomplished without difficulty.                            The patient tolerated the procedure well. Scope In: Scope Out: Findings:                 The examined portions of the nasopharynx,                            oropharynx and larynx were normal.                           The examined esophagus was normal. There was subtle                            stenosis at the GEJ and a small island of salmon                            colored mucosa. The endoscope passed easily.                           Diffuse granular mucosa was found in the gastric                            body and in the gastric antrum. Biopsies were taken                            with a cold forceps for Helicobacter pylori                            testing. Estimated blood loss was minimal.                           The exam of the stomach was otherwise normal.                           The examined duodenum was normal. Complications:            No immediate complications. Estimated Blood Loss:     Estimated blood loss  was minimal. Impression:               - The examined portions of the nasopharynx,                            oropharynx and larynx were normal.                           - Normal esophagus.                           - Granular gastric mucosa. Biopsied.                           - Normal examined duodenum.                           - No abnormalities to explain chronic epigastric                            pain                           - No esophageal varices or other endoscopic                            evidence of cirrhosis/portal hypertension Recommendation:           - Patient has a contact number available for                            emergencies. The signs and symptoms of potential                            delayed complications were discussed with the                            patient. Return to normal activities tomorrow.                            Written discharge instructions were  provided to the                            patient.                           - Resume previous diet.                           - Continue present medications.                           - Await pathology results.                           - Follow up as needed in clinic with Dr. Dustin Flock E. Candis Schatz, MD 07/13/2021 11:05:22 AM This report has been signed electronically.

## 2021-07-13 NOTE — Progress Notes (Signed)
[facility] History and Physical   Primary Care Physician:  Loman Brooklyn, FNP   Reason for Procedure:   Epigastric pain  Plan:    EGD     HPI: Dustin Graham is a 71 y.o. male undergoing EGD to evaluate chronic epigastric pain.  The pain was thought to be secondary to his gallbladder, and he underwent a cholecystectomy.  The pain resolved for a while, but has since returned, although it is improved in the past month or so.  In addition, the patient was noted to have a cirrhotic appearing liver on imaging and during his cholecytectomy.  A subsequent elastography was not suggestive of cirrhosis.   Past Medical History:  Diagnosis Date   GERD (gastroesophageal reflux disease)    HLD (hyperlipidemia)    Hypertension associated with diabetes ([facility]) 02/29/2016   Rosacea 01/28/2019   Type 1 diabetes ([facility])     Past Surgical History:  Procedure Laterality Date   broken nose     CHOLECYSTECTOMY     GALLBLADDER SURGERY     LUMBAR DISC SURGERY     fracture    Prior to Admission medications   Medication Sig Start Date End Date Taking? Authorizing Provider  Aspirin-Salicylamide-Caffeine (BC HEADACHE POWDER PO) Take by mouth.   Yes [provider]  atorvastatin (LIPITOR) 40 MG tablet TAKE 1 TABLET BY MOUTH EVERY DAY 05/23/21  Yes Hendricks Limes F, FNP  Continuous Blood Gluc Receiver (FREESTYLE LIBRE 2 READER) DEVI Use to test blood sugar up to 6 times daily. DX; E11.9 01/27/20  Yes Hendricks Limes F, FNP  Continuous Blood Gluc Sensor (FREESTYLE LIBRE 2 SENSOR) MISC Use to test blood sugar up to 6 times daily. DX: E11.9 07/22/20  Yes Hendricks Limes F, FNP  enalapril (VASOTEC) 20 MG tablet TAKE 1 TABLET BY MOUTH EVERY DAY 05/23/21  Yes Hendricks Limes F, FNP  glucose blood (CONTOUR NEXT TEST) test strip Test 3 times a day. Dx. E11.9 12/03/19  Yes Hendricks Limes F, FNP  insulin degludec (TRESIBA FLEXTOUCH) 200 UNIT/ML FlexTouch Pen Inject 18 Units into the skin in the  morning and at bedtime. 06/28/21  Yes Hendricks Limes F, FNP  Insulin Pen Needle (BD PEN NEEDLE MICRO U/F) 32G X 6 MM MISC Use with injection 6 times daily Dx E10.9 01/27/20  Yes Hendricks Limes F, FNP  NOVOLOG FLEXPEN 100 UNIT/ML FlexPen INJECT 30 UNITS INTO THE SKIN 3 (THREE) TIMES DAILY WITH MEALS. 06/26/21  Yes Loman Brooklyn, FNP  OneTouch Delica Lancets 26V MISC  07/13/15  Yes [provider]  traMADol (ULTRAM) 50 MG tablet Take 50 mg by mouth every 6 (six) hours as needed. 06/23/21  Yes [provider]  diclofenac (VOLTAREN) 75 MG EC tablet Take 1 tablet (75 mg total) by mouth 2 (two) times daily. 06/23/21   Loman Brooklyn, FNP  omeprazole (PRILOSEC) 20 MG capsule Take 1 capsule (20 mg total) by mouth daily. 06/23/21   Loman Brooklyn, FNP    Current Outpatient Medications  Medication Sig Dispense Refill   Aspirin-Salicylamide-Caffeine (BC HEADACHE POWDER PO) Take by mouth.     atorvastatin (LIPITOR) 40 MG tablet TAKE 1 TABLET BY MOUTH EVERY DAY 90 tablet 0   Continuous Blood Gluc Receiver (FREESTYLE LIBRE 2 READER) DEVI Use to test blood sugar up to 6 times daily. DX; E11.9 1 each 0   Continuous Blood Gluc Sensor (FREESTYLE LIBRE 2 SENSOR) MISC Use to test blood sugar up to 6 times daily. DX: E11.9 2  each 11   enalapril (VASOTEC) 20 MG tablet TAKE 1 TABLET BY MOUTH EVERY DAY 90 tablet 0   glucose blood (CONTOUR NEXT TEST) test strip Test 3 times a day. Dx. E11.9 100 each 3   insulin degludec (TRESIBA FLEXTOUCH) 200 UNIT/ML FlexTouch Pen Inject 18 Units into the skin in the morning and at bedtime. 9 mL 0   Insulin Pen Needle (BD PEN NEEDLE MICRO U/F) 32G X 6 MM MISC Use with injection 6 times daily Dx E10.9 600 each 3   NOVOLOG FLEXPEN 100 UNIT/ML FlexPen INJECT 30 UNITS INTO THE SKIN 3 (THREE) TIMES DAILY WITH MEALS. 15 mL 0   OneTouch Delica Lancets 59D MISC      traMADol (ULTRAM) 50 MG tablet Take 50 mg by mouth every 6 (six) hours as needed.     diclofenac (VOLTAREN) 75  MG EC tablet Take 1 tablet (75 mg total) by mouth 2 (two) times daily. 30 tablet 1   omeprazole (PRILOSEC) 20 MG capsule Take 1 capsule (20 mg total) by mouth daily. 90 capsule 1   Current Facility-Administered Medications  Medication Dose Route Frequency Provider Last Rate Last Admin   0.9 %  sodium chloride infusion  500 mL Intravenous Once Daryel November, MD        Allergies as of 07/13/2021 - Review Complete 07/13/2021  Allergen Reaction Noted   Codeine Nausea And Vomiting 11/04/2013    Family History  Problem Relation Age of Onset   Diabetes Paternal Aunt     Social History   Socioeconomic History   Marital status: Divorced    Spouse name: Not on file   Number of children: 1   Years of education: 12   Highest education level: Some college, no degree  Occupational History    Employer: [employer]   Occupation: IT trainer    Comment: [employer]  Tobacco Use   Smoking status: Never   Smokeless tobacco: Never  Vaping Use   Vaping Use: Never used  Substance and Sexual Activity   Alcohol use: Yes    Alcohol/week: 3.0 - 4.0 standard drinks    Types: 3 - 4 Standard drinks or equivalent per week    Comment: several beers and a couple of shots per day   Drug use: Not Currently    Types: Marijuana   Sexual activity: Not on file  Other Topics Concern   Not on file  Social History Narrative   Lives alone   Social Determinants of Health   Financial Resource Strain: Low Risk    Difficulty of Paying Living Expenses: Not hard at all  Food Insecurity: No Food Insecurity   Worried About Charity fundraiser in the Last Year: Never true   Ran Out of Food in the Last Year: Never true  Transportation Needs: No Transportation Needs   Lack of Transportation (Medical): No   Lack of Transportation (Non-Medical): No  Physical Activity: Inactive   Days of Exercise per Week: 0 days   Minutes of Exercise per Session: 0 min  Stress: Stress Concern  Present   Feeling of Stress : To some extent  Social Connections: Moderately Integrated   Frequency of Communication with Friends and Family: More than three times a week   Frequency of Social Gatherings with Friends and Family: More than three times a week   Attends Religious Services: 1 to 4 times per year   Active Member of [employer] or Organizations: Yes   Attends Club or  Organization Meetings: 1 to 4 times per year   Marital Status: Divorced  Human resources officer Violence: Not At Risk   Fear of Current or Ex-Partner: No   Emotionally Abused: No   Physically Abused: No   Sexually Abused: No    Review of Systems:  All other review of systems negative except as mentioned in the HPI.  Physical Exam: Vital signs BP 129/71    Pulse 88    Temp 98.6 F (37 C)    Resp 13    Ht 5\' 11"  (1.803 m)    Wt 196 lb (88.9 kg)    SpO2 99%    BMI 27.34 kg/m   General:   Alert,  Well-developed, well-nourished, pleasant and cooperative in NAD Airway:  Mallampati 3 Lungs:  Clear throughout to auscultation.   Heart:  Regular rate and rhythm; no murmurs, clicks, rubs,  or gallops. Abdomen:  Soft, nontender and nondistended. Normal bowel sounds.   Neuro/Psych:  Normal mood and affect. A and O x 3   Roniyah Llorens E. Candis Schatz, MD [facility]

## 2021-07-13 NOTE — Progress Notes (Signed)
Called to room to assist during endoscopic procedure.  Patient ID and intended procedure confirmed with present staff. Received instructions for my participation in the procedure from the performing physician.  

## 2021-07-13 NOTE — Progress Notes (Signed)
Report to PACU, RN, vss, BBS= Clear.  

## 2021-07-13 NOTE — Progress Notes (Signed)
C.W. vital signs. 

## 2021-07-18 ENCOUNTER — Telehealth: Payer: Self-pay

## 2021-07-18 ENCOUNTER — Telehealth: Payer: Self-pay | Admitting: *Deleted

## 2021-07-18 NOTE — Telephone Encounter (Signed)
°  Follow up Call-  Call back number 07/13/2021  Post procedure Call Back phone  # [phone]  Permission to leave phone message Yes     Patient questions:  Do you have a fever, pain , or abdominal swelling? No. Pain Score  0 *  Have you tolerated food without any problems? Yes.    Have you been able to return to your normal activities? Yes.    Do you have any questions about your discharge instructions: Diet   No. Medications  No. Follow up visit  No.  Do you have questions or concerns about your Care? No.  Actions: * If pain score is 4 or above: No action needed, pain <4.

## 2021-07-18 NOTE — Telephone Encounter (Signed)
No answer on follow up call. 

## 2021-07-28 ENCOUNTER — Other Ambulatory Visit: Payer: Self-pay | Admitting: Family Medicine

## 2021-07-28 DIAGNOSIS — M25512 Pain in left shoulder: Secondary | ICD-10-CM

## 2021-08-15 ENCOUNTER — Other Ambulatory Visit: Payer: Self-pay | Admitting: Family Medicine

## 2021-08-15 DIAGNOSIS — E1159 Type 2 diabetes mellitus with other circulatory complications: Secondary | ICD-10-CM

## 2021-08-16 ENCOUNTER — Encounter: Payer: Self-pay | Admitting: Gastroenterology

## 2021-08-16 ENCOUNTER — Other Ambulatory Visit (INDEPENDENT_AMBULATORY_CARE_PROVIDER_SITE_OTHER): Payer: Medicare Other

## 2021-08-16 ENCOUNTER — Ambulatory Visit (INDEPENDENT_AMBULATORY_CARE_PROVIDER_SITE_OTHER): Payer: Medicare Other | Admitting: Gastroenterology

## 2021-08-16 VITALS — BP 132/78 | HR 91 | Ht 71.0 in | Wt 203.8 lb

## 2021-08-16 DIAGNOSIS — R1084 Generalized abdominal pain: Secondary | ICD-10-CM

## 2021-08-16 DIAGNOSIS — R1011 Right upper quadrant pain: Secondary | ICD-10-CM | POA: Diagnosis not present

## 2021-08-16 DIAGNOSIS — K709 Alcoholic liver disease, unspecified: Secondary | ICD-10-CM | POA: Diagnosis not present

## 2021-08-16 LAB — COMPREHENSIVE METABOLIC PANEL
ALT: 34 U/L (ref 0–53)
AST: 33 U/L (ref 0–37)
Albumin: 4.3 g/dL (ref 3.5–5.2)
Alkaline Phosphatase: 92 U/L (ref 39–117)
BUN: 13 mg/dL (ref 6–23)
CO2: 28 mEq/L (ref 19–32)
Calcium: 9.6 mg/dL (ref 8.4–10.5)
Chloride: 102 mEq/L (ref 96–112)
Creatinine, Ser: 0.72 mg/dL (ref 0.40–1.50)
GFR: 92.74 mL/min (ref >60.00)
Glucose, Bld: 180 mg/dL — ABNORMAL HIGH (ref 70–99)
Potassium: 4.9 mEq/L (ref 3.5–5.1)
Sodium: 137 mEq/L (ref 135–145)
Total Bilirubin: 1.3 mg/dL — ABNORMAL HIGH (ref 0.2–1.2)
Total Protein: 7.5 g/dL (ref 6.0–8.3)

## 2021-08-16 LAB — GAMMA GT: GGT: 467 U/L — ABNORMAL HIGH (ref 7–51)

## 2021-08-16 MED ORDER — MELOXICAM 7.5 MG PO TABS: 7.5000 mg | ORAL_TABLET | Freq: Every day | ORAL | 0 refills | Status: DC

## 2021-08-16 NOTE — Patient Instructions (Signed)
If you are age 71 or older, your body mass index should be between 23-30. Your Body mass index is 28.42 kg/m?Marland Kitchen If this is out of the aforementioned range listed, please consider follow up with your Primary Care Provider. ? ?If you are age 33 or younger, your body mass index should be between 19-25. Your Body mass index is 28.42 kg/m?Marland Kitchen If this is out of the aformentioned range listed, please consider follow up with your Primary Care Provider.  ? ?Your provider has requested that you go to the basement level for lab work before leaving today. Press "B" on the elevator. The lab is located at the first door on the left as you exit the elevator. ? ?We have sent the following medications to your pharmacy for you to pick up at your convenience: Mobic 7.5 mg one tablet by mouth daily for 6 weeks. ? ? ?The [facility] providers would like to encourage you to use [facility] to communicate with providers for non-urgent requests or questions.  Due to long hold times on the telephone, sending your provider a message by [facility] may be a faster and more efficient way to get a response.  Please allow 48 business hours for a response.  Please remember that this is for non-urgent requests.  ? ?Due to recent changes in healthcare laws, you may see the results of your imaging and laboratory studies on MyChart before your provider has had a chance to review them.  We understand that in some cases there may be results that are confusing or concerning to you. Not all laboratory results come back in the same time frame and the provider may be waiting for multiple results in order to interpret others.  Please give [place] 48 hours in order for your provider to thoroughly review all the results before contacting the office for clarification of your results.  ? ?It was a pleasure to see you today! ? ?Thank you for trusting me with your gastrointestinal care!   ? ?Scott E.Candis Schatz, MD  ?

## 2021-08-16 NOTE — Progress Notes (Signed)
? ?HPI : Dustin Graham is a very pleasant 71 year old male with type 1 diabetes who I initially met in November 2022 when he was referred for concern for cirrhosis based on ultrasound findings prior to a cholecystectomy for cholecystitis in September 2022.  When I saw him in clinic initially we evaluated him for other causes of chronic liver disease which were negative.  A repeat ultrasound with elastography showed diffuse steatosis, but no comment on nodularity of the liver.  Elastography measurements were not consistent with cirrhosis.  His platelet counts have not been consistent with portal hypertension (170s, 180s).  His bilirubin was mildly elevated at 1.3, but was likely indirect (direct bili 0.3).  INR was minimally elevated 1.1.  He does have a persistent mild aminotransferase elevation, AST predominant.  He underwent an upper endoscopy in February which was negative for esophageal varices.  Gastric biopsies showed reactive gastropathy, no H. pylori. ? ?Today, the patient reports ongoing problems with focal right upper quadrant pain.  He states that this pain has been present for many years.  It went away for a while but has been persistent in the last 2 years or so.  The pain is more of a problem in the afternoon and evening.  It is pretty much noticed exclusively at work and is worsened with certain activities and movements.  It does seem to improve however with bowel movements.  It is not associated with other symptoms such as nausea or vomiting.  It is not typically worsened with eating.  He has been having some issues with constipation recently and is taking milk of magnesia as needed which seems to work well for him.  He takes Tylenol sometimes for the pain. ? ?Regarding his alcohol use, he reports cutting back, but continuing to drink nightly (usually has 1 [place] drink and 4-5 beers per night).  Again he states that he needs to drink in order to go to sleep.  If he does not drink, he will stay  up all night mostly concerned about work and worrying about day-to-day stressors. ? ?At his last visit, we also discussed colon cancer screening.  Recommended colonoscopy.  He said he had a Cologuard kit at his house that he has not submitted but would plan to do so.  Today, he admits that he has not yet submitted his Cologuard, and admits that this is just related to "squeamishness" on his part related to handling stool sample. ? ?Past Medical History:  ?Diagnosis Date  ? GERD (gastroesophageal reflux disease)   ? HLD (hyperlipidemia)   ? Hypertension associated with diabetes ([place]) 02/29/2016  ? Rosacea 01/28/2019  ? Type 1 diabetes ([place])   ? ? ? ?Past Surgical History:  ?Procedure Laterality Date  ? broken nose    ? CHOLECYSTECTOMY    ? GALLBLADDER SURGERY    ? LUMBAR DISC SURGERY    ? fracture  ? ?Family History  ?Problem Relation Age of Onset  ? Peptic Ulcer Father   ? Diabetes Paternal Aunt   ? Colon cancer Neg Hx   ? Esophageal cancer Neg Hx   ? Stomach cancer Neg Hx   ? Colon polyps Neg Hx   ? ?Social History  ? ?Tobacco Use  ? Smoking status: Never  ? Smokeless tobacco: Never  ?Vaping Use  ? Vaping Use: Never used  ?Substance Use Topics  ? Alcohol use: Yes  ?  Alcohol/week: 3.0 - 4.0 standard drinks  ?  Types: 3 - 4 Standard  drinks or equivalent per week  ?  Comment: several beers and a couple of shots per day  ? Drug use: Not Currently  ?  Types: Marijuana  ? ?Current Outpatient Medications  ?Medication Sig Dispense Refill  ? Aspirin-Salicylamide-Caffeine (BC HEADACHE POWDER PO) Take by mouth.    ? atorvastatin (LIPITOR) 40 MG tablet TAKE 1 TABLET BY MOUTH EVERY DAY 90 tablet 0  ? Continuous Blood Gluc Receiver (FREESTYLE LIBRE 2 READER) DEVI Use to test blood sugar up to 6 times daily. DX; E11.9 1 each 0  ? Continuous Blood Gluc Sensor (FREESTYLE LIBRE 2 SENSOR) MISC Use to test blood sugar up to 6 times daily. DX: E11.9 2 each 11  ? diclofenac (VOLTAREN) 75 MG EC tablet TAKE 1 TABLET BY MOUTH TWICE A DAY  30 tablet 1  ? enalapril (VASOTEC) 20 MG tablet TAKE 1 TABLET BY MOUTH EVERY DAY 90 tablet 0  ? glucose blood (CONTOUR NEXT TEST) test strip Test 3 times a day. Dx. E11.9 100 each 3  ? Insulin Pen Needle (BD PEN NEEDLE MICRO U/F) 32G X 6 MM MISC Use with injection 6 times daily Dx E10.9 600 each 3  ? NOVOLOG FLEXPEN 100 UNIT/ML FlexPen INJECT 30 UNITS INTO THE SKIN 3 (THREE) TIMES DAILY WITH MEALS. 15 mL 0  ? omeprazole (PRILOSEC) 20 MG capsule Take 1 capsule (20 mg total) by mouth daily. 90 capsule 1  ? OneTouch Delica Lancets 31Y MISC     ? TRESIBA FLEXTOUCH 200 UNIT/ML FlexTouch Pen INJECT 18 UNITS INTO THE SKIN IN THE MORNING AND AT BEDTIME. 9 mL 0  ? traMADol (ULTRAM) 50 MG tablet Take 50 mg by mouth every 6 (six) hours as needed. (Patient not taking: Reported on 08/16/2021)    ? ?No current facility-administered medications for this visit.  ? ?Allergies  ?Allergen Reactions  ? Codeine Nausea And Vomiting  ? ? ? ?Review of Systems: ?All systems reviewed and negative except where noted in HPI.  ? ? ?No results found. ? ?Physical Exam: ?BP 132/78   Pulse 91   Ht $R'5\' 11"'SO$  (1.803 m)   Wt 203 lb 12.8 oz (92.4 kg)   SpO2 98%   BMI 28.42 kg/m?  ?Constitutional: Pleasant,well-developed, Caucasian male in no acute distress. ?HEENT: Normocephalic and atraumatic. Conjunctivae are normal. No scleral icterus. ?Neck supple.  ?Cardiovascular: Normal rate, regular rhythm.  ?Pulmonary/chest: Effort normal and breath sounds normal. No wheezing, rales or rhonchi. ?Abdominal: Soft, nondistended, tenderness to palpation in the right upper quadrant, fairly well localized.  Pain is worsened with engagement of the abdominis rectus (positive Carnett's sign). Bowel sounds active throughout. There are no masses palpable. No hepatomegaly. ?Extremities: no edema ?Lymphadenopathy: No cervical adenopathy noted. ?Neurological: Alert and oriented to person place and time. ?Skin: Skin is warm and dry. No rashes noted. ?Psychiatric: Normal  mood and affect. Behavior is normal. ? ?CBC ?   ?Component Value Date/Time  ? WBC 5.0 06/23/2021 1342  ? WBC 8.2 04/05/2021 1515  ? RBC 4.30 06/23/2021 1342  ? RBC 4.32 04/05/2021 1515  ? HGB 14.0 06/23/2021 1342  ? HCT 40.4 06/23/2021 1342  ? PLT 169 06/23/2021 1342  ? MCV 94 06/23/2021 1342  ? MCH 32.6 06/23/2021 1342  ? MCHC 34.7 06/23/2021 1342  ? MCHC 33.6 04/05/2021 1515  ? RDW 12.2 06/23/2021 1342  ? LYMPHSABS 1.5 06/23/2021 1342  ? MONOABS 0.6 04/05/2021 1515  ? EOSABS 0.2 06/23/2021 1342  ? BASOSABS 0.1 06/23/2021 1342  ? ? ?  CMP  ?   ?Component Value Date/Time  ? NA 139 06/23/2021 1342  ? K 4.4 06/23/2021 1342  ? CL 102 06/23/2021 1342  ? CO2 25 06/23/2021 1342  ? GLUCOSE 204 (H) 06/23/2021 1342  ? GLUCOSE 138 (H) 04/05/2021 1515  ? BUN 11 06/23/2021 1342  ? CREATININE 0.73 (L) 06/23/2021 1342  ? CALCIUM 9.4 06/23/2021 1342  ? PROT 6.8 06/23/2021 1342  ? ALBUMIN 3.9 06/23/2021 1342  ? AST 27 06/23/2021 1342  ? ALT 24 06/23/2021 1342  ? ALKPHOS 96 06/23/2021 1342  ? BILITOT 1.3 (H) 06/23/2021 1342  ? GFRNONAA 102 07/22/2020 0916  ? GFRAA 118 07/22/2020 0916  ? ? ? 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  At this time, I cannot say he has cirrhosis, but cannot say with confidence that he has alcoholic liver disease, likely with some degree of fibrosis.  He is still actively drinking 5-6 servings of alcohol per day.  I again counseled him that continued use will only accelerate his risk of liver damage increased risk of death from cirrhosis.  I told him that if he stops drinking completely I think is very unlikely he would die from cirrhosis. ?I offered referral for counseling or medications to help with alcohol cessation, but the patient declined.  Given that his reported alcohol use is strictly for sleep, I offered trial of Benadryl at night to help.  He said he would get this over-the-counter. ?With regards to his  abdominal pain, his physical exam and most of his history seems consistent with a musculoskeletal cause of his abdominal pain.  I recommended a trial of Mobic for 6 weeks.  If no improvement we can consider abdomin

## 2021-08-17 ENCOUNTER — Encounter: Payer: Self-pay | Admitting: Gastroenterology

## 2021-08-17 NOTE — Progress Notes (Signed)
Dustin Graham,  ?Your liver enzymes are stable from 2 months ago.  Your GGT is still markedly elevated, which indicates ongoing liver inflammation from alcohol use.  Please make concerted efforts to stop or significantly reduce your alcohol consumption to reduce the risk of cirrhosis or alcoholic hepatitis.

## 2021-08-22 NOTE — Telephone Encounter (Signed)
Hi Amy, ?Dr. Candis Schatz offered to perform abdominal wall injections on this patient. Is there any way to see if this is covered by his insurance? Do I need to enter an ambulatory referral?  ?Thanks ?

## 2021-08-22 NOTE — Telephone Encounter (Signed)
Medicare does not require an auth or any prior notification on what anything order for the patient.  Also, they do not quote any kind of benefits to [place].  Their response is they pay "based on medical necessity".  Where ever she has the service, their team will discuss with the patient if she needs to put down any kind of deposit.   ?

## 2021-08-31 ENCOUNTER — Other Ambulatory Visit: Payer: Self-pay | Admitting: Family Medicine

## 2021-08-31 ENCOUNTER — Telehealth: Payer: Self-pay | Admitting: Family Medicine

## 2021-08-31 DIAGNOSIS — M25512 Pain in left shoulder: Secondary | ICD-10-CM

## 2021-08-31 DIAGNOSIS — E1159 Type 2 diabetes mellitus with other circulatory complications: Secondary | ICD-10-CM

## 2021-08-31 NOTE — Telephone Encounter (Signed)
Patient is upset because his Novolog was only called in for 14 days. Made him aware that he has appt on 4/27 and that the medication was called in for him to have enough until that day. He would like for the mediation to be called in for the full three months. Also aware that we will not reopen until Monday 4/10.  ?

## 2021-09-02 ENCOUNTER — Other Ambulatory Visit: Payer: Self-pay | Admitting: Family Medicine

## 2021-09-02 DIAGNOSIS — I152 Hypertension secondary to endocrine disorders: Secondary | ICD-10-CM

## 2021-09-02 DIAGNOSIS — E1159 Type 2 diabetes mellitus with other circulatory complications: Secondary | ICD-10-CM

## 2021-09-04 MED ORDER — NOVOLOG FLEXPEN 100 UNIT/ML ~~LOC~~ SOPN: 30.0000 [IU] | PEN_INJECTOR | Freq: Three times a day (TID) | SUBCUTANEOUS | 0 refills | Status: DC

## 2021-09-04 NOTE — Telephone Encounter (Signed)
Patient aware.

## 2021-09-04 NOTE — Telephone Encounter (Signed)
3 month supply ordered.

## 2021-09-05 ENCOUNTER — Ambulatory Visit (INDEPENDENT_AMBULATORY_CARE_PROVIDER_SITE_OTHER): Payer: Medicare Other

## 2021-09-05 VITALS — Wt 203.0 lb

## 2021-09-05 DIAGNOSIS — Z0000 Encounter for general adult medical examination without abnormal findings: Secondary | ICD-10-CM | POA: Diagnosis not present

## 2021-09-05 NOTE — Patient Instructions (Signed)
Mr. Dustin Graham , ?Thank you for taking time to come for your Medicare Wellness Visit. I appreciate your ongoing commitment to your health goals. Please review the following plan we discussed and let me know if I can assist you in the future.  ? ?Screening recommendations/referrals: ?Colonoscopy: Return Cologuard test soon ?Recommended yearly ophthalmology/optometry visit for glaucoma screening and checkup ?Recommended yearly dental visit for hygiene and checkup ? ?Vaccinations: ?Influenza vaccine: Done 03/22/2021 - Repeat annually ?Pneumococcal vaccine: Done 09/26/2016 & 07/23/2018 ?Tdap vaccine: Due* - recommended every 10 years ?Shingles vaccine: Due* - Shingrix is 2 doses 2-6 months apart and over 90% effective     ?Covid-19: Done 07/02/2019, 07/31/2019, 02/15/2020, & 09/18/2020 ? ?Advanced directives: Advance directive discussed with you today. Even though you declined this today, please call our office should you change your mind, and we can give you the proper paperwork for you to fill out.  ? ?Conditions/risks identified: Aim for 30 minutes of exercise or brisk walking, 6-8 glasses of water, and 5 servings of fruits and vegetables each day.  ? ?Next appointment: Follow up in one year for your annual wellness visit.  ? ?Preventive Care 71 Years and Older, Male ? ?Preventive care refers to lifestyle choices and visits with your health care provider that can promote health and wellness. ?What does preventive care include? ?A yearly physical exam. This is also called an annual well check. ?Dental exams once or twice a year. ?Routine eye exams. Ask your health care provider how often you should have your eyes checked. ?Personal lifestyle choices, including: ?Daily care of your teeth and gums. ?Regular physical activity. ?Eating a healthy diet. ?Avoiding tobacco and drug use. ?Limiting alcohol use. ?Practicing safe sex. ?Taking low doses of aspirin every day. ?Taking vitamin and mineral supplements as recommended by your health  care provider. ?What happens during an annual well check? ?The services and screenings done by your health care provider during your annual well check will depend on your age, overall health, lifestyle risk factors, and family history of disease. ?Counseling  ?Your health care provider may ask you questions about your: ?Alcohol use. ?Tobacco use. ?Drug use. ?Emotional well-being. ?Home and relationship well-being. ?Sexual activity. ?Eating habits. ?History of falls. ?Memory and ability to understand (cognition). ?Work and work Statistician. ?Screening  ?You may have the following tests or measurements: ?Height, weight, and BMI. ?Blood pressure. ?Lipid and cholesterol levels. These may be checked every 5 years, or more frequently if you are over 37 years old. ?Skin check. ?Lung cancer screening. You may have this screening every year starting at age 38 if you have a 30-pack-year history of smoking and currently smoke or have quit within the past 15 years. ?Fecal occult blood test (FOBT) of the stool. You may have this test every year starting at age 20. ?Flexible sigmoidoscopy or colonoscopy. You may have a sigmoidoscopy every 5 years or a colonoscopy every 10 years starting at age 15. ?Prostate cancer screening. Recommendations will vary depending on your family history and other risks. ?Hepatitis C blood test. ?Hepatitis B blood test. ?Sexually transmitted disease (STD) testing. ?Diabetes screening. This is done by checking your blood sugar (glucose) after you have not eaten for a while (fasting). You may have this done every 1-3 years. ?Abdominal aortic aneurysm (AAA) screening. You may need this if you are a current or former smoker. ?Osteoporosis. You may be screened starting at age 22 if you are at high risk. ?Talk with your health care provider about your test  results, treatment options, and if necessary, the need for more tests. ?Vaccines  ?Your health care provider may recommend certain vaccines, such  as: ?Influenza vaccine. This is recommended every year. ?Tetanus, diphtheria, and acellular pertussis (Tdap, Td) vaccine. You may need a Td booster every 10 years. ?Zoster vaccine. You may need this after age 71. ?Pneumococcal 13-valent conjugate (PCV13) vaccine. One dose is recommended after age 53. ?Pneumococcal polysaccharide (PPSV23) vaccine. One dose is recommended after age 90. ?Talk to your health care provider about which screenings and vaccines you need and how often you need them. ?This information is not intended to replace advice given to you by your health care provider. Make sure you discuss any questions you have with your health care provider. ?Document Released: 06/10/2015 Document Revised: 02/01/2016 Document Reviewed: 03/15/2015 ?Elsevier Interactive Patient Education ? 2017 [place]. ? ?Fall Prevention in the Home ?Falls can cause injuries. They can happen to people of all ages. There are many things you can do to make your home safe and to help prevent falls. ?What can I do on the outside of my home? ?Regularly fix the edges of walkways and driveways and fix any cracks. ?Remove anything that might make you trip as you walk through a door, such as a raised step or threshold. ?Trim any bushes or trees on the path to your home. ?Use bright outdoor lighting. ?Clear any walking paths of anything that might make someone trip, such as rocks or tools. ?Regularly check to see if handrails are loose or broken. Make sure that both sides of any steps have handrails. ?Any raised decks and porches should have guardrails on the edges. ?Have any leaves, snow, or ice cleared regularly. ?Use sand or salt on walking paths during winter. ?Clean up any spills in your garage right away. This includes oil or grease spills. ?What can I do in the bathroom? ?Use night lights. ?Install grab bars by the toilet and in the tub and shower. Do not use towel bars as grab bars. ?Use non-skid mats or decals in the tub or  shower. ?If you need to sit down in the shower, use a plastic, non-slip stool. ?Keep the floor dry. Clean up any water that spills on the floor as soon as it happens. ?Remove soap buildup in the tub or shower regularly. ?Attach bath mats securely with double-sided non-slip rug tape. ?Do not have throw rugs and other things on the floor that can make you trip. ?What can I do in the bedroom? ?Use night lights. ?Make sure that you have a light by your bed that is easy to reach. ?Do not use any sheets or blankets that are too big for your bed. They should not hang down onto the floor. ?Have a firm chair that has side arms. You can use this for support while you get dressed. ?Do not have throw rugs and other things on the floor that can make you trip. ?What can I do in the kitchen? ?Clean up any spills right away. ?Avoid walking on wet floors. ?Keep items that you use a lot in easy-to-reach places. ?If you need to reach something above you, use a strong step stool that has a grab bar. ?Keep electrical cords out of the way. ?Do not use floor polish or wax that makes floors slippery. If you must use wax, use non-skid floor wax. ?Do not have throw rugs and other things on the floor that can make you trip. ?What can I do with my stairs? ?  Do not leave any items on the stairs. ?Make sure that there are handrails on both sides of the stairs and use them. Fix handrails that are broken or loose. Make sure that handrails are as long as the stairways. ?Check any carpeting to make sure that it is firmly attached to the stairs. Fix any carpet that is loose or worn. ?Avoid having throw rugs at the top or bottom of the stairs. If you do have throw rugs, attach them to the floor with carpet tape. ?Make sure that you have a light switch at the top of the stairs and the bottom of the stairs. If you do not have them, ask someone to add them for you. ?What else can I do to help prevent falls? ?Wear shoes that: ?Do not have high heels. ?Have  rubber bottoms. ?Are comfortable and fit you well. ?Are closed at the toe. Do not wear sandals. ?If you use a stepladder: ?Make sure that it is fully opened. Do not climb a closed stepladder. ?Make sure that b

## 2021-09-05 NOTE — Progress Notes (Signed)
? ?Subjective:  ? Dustin Graham is a 71 y.o. male who presents for Medicare Annual/Subsequent preventive examination. ? ?Virtual Visit via Telephone Note ? ?I connected with  [name] on 09/05/21 at  2:45 PM EDT by telephone and verified that I am speaking with the correct person using two identifiers. ? ?Location: ?Patient: Home ?Provider: [facility] ?Persons participating in the virtual visit: patient/Nurse Health Advisor ?  ?I discussed the limitations, risks, security and privacy concerns of performing an evaluation and management service by telephone and the availability of in person appointments. The patient expressed understanding and agreed to proceed. ? ?Interactive audio and video telecommunications were attempted between this nurse and patient, however failed, due to patient having technical difficulties OR patient did not have access to video capability.  We continued and completed visit with audio only. ? ?Some vital signs may be absent or patient reported.  ? ?[name], LPN  ? ?Review of Systems    ? ?Cardiac Risk Factors include: advanced age (>59mn, >>66women);diabetes mellitus;dyslipidemia;hypertension;sedentary lifestyle ? ?   ?Objective:  ?  ?Today's Vitals  ? 09/05/21 1451  ?Weight: 203 lb (92.1 kg)  ?PainSc: 4   ? ?Body mass index is 28.31 kg/m?. ? ? ?  09/05/2021  ?  2:57 PM 09/02/2020  ?  1:58 PM 09/02/2019  ?  2:36 PM  ?Advanced Directives  ?Does Patient Have a Medical Advance Directive? No Yes No  ?Type of [place]porate [place]f [place]   ?Copy of [place]?  No - copy requested   ?Would patient like information on creating a medical advance directive? No - Patient declined  No - Patient declined  ? ? ?Current Medications (verified) ?Outpatient Encounter Medications as of 09/05/2021  ?Medication Sig  ? Aspirin-Salicylamide-Caffeine (BC HEADACHE POWDER PO) Take by mouth.  ? atorvastatin (LIPITOR) 40 MG tablet TAKE 1 TABLET BY MOUTH  EVERY DAY  ? Continuous Blood Gluc Receiver (FREESTYLE LIBRE 2 READER) DEVI Use to test blood sugar up to 6 times daily. DX; E11.9  ? Continuous Blood Gluc Sensor (FREESTYLE LIBRE 2 SENSOR) MISC Use to test blood sugar up to 6 times daily. DX: E11.9  ? diclofenac (VOLTAREN) 75 MG EC tablet TAKE 1 TABLET BY MOUTH TWICE A DAY  ? enalapril (VASOTEC) 20 MG tablet Take 1 tablet (20 mg total) by mouth daily. Must be seen for any further refills  ? glucose blood (CONTOUR NEXT TEST) test strip Test 3 times a day. Dx. E11.9  ? insulin aspart (NOVOLOG FLEXPEN) 100 UNIT/ML FlexPen Inject 30 Units into the skin 3 (three) times daily with meals.  ? Insulin Pen Needle (BD PEN NEEDLE MICRO U/F) 32G X 6 MM MISC Use with injection 6 times daily Dx E10.9  ? meloxicam (MOBIC) 7.5 MG tablet Take 1 tablet (7.5 mg total) by mouth daily.  ? omeprazole (PRILOSEC) 20 MG capsule Take 1 capsule (20 mg total) by mouth daily.  ? OneTouch Delica Lancets 341PMISC   ? TRESIBA FLEXTOUCH 200 UNIT/ML FlexTouch Pen INJECT 18 UNITS INTO THE SKIN IN THE MORNING AND AT BEDTIME.  ? traMADol (ULTRAM) 50 MG tablet Take 50 mg by mouth every 6 (six) hours as needed. (Patient not taking: Reported on 08/16/2021)  ? ?No facility-administered encounter medications on file as of 09/05/2021.  ? ? ?Allergies (verified) ?Codeine  ? ?History: ?Past Medical History:  ?Diagnosis Date  ? GERD (gastroesophageal reflux disease)   ? HLD (hyperlipidemia)   ?  Hypertension associated with diabetes ([place]) 02/29/2016  ? Rosacea 01/28/2019  ? Type 1 diabetes ([place])   ? ?Past Surgical History:  ?Procedure Laterality Date  ? broken nose    ? CHOLECYSTECTOMY    ? GALLBLADDER SURGERY    ? LUMBAR DISC SURGERY    ? fracture  ? ?Family History  ?Problem Relation Age of Onset  ? Peptic Ulcer Father   ? Diabetes Paternal Aunt   ? Colon cancer Neg Hx   ? Esophageal cancer Neg Hx   ? Stomach cancer Neg Hx   ? Colon polyps Neg Hx   ? ?Social History  ? ?Socioeconomic History  ? Marital status:  Divorced  ?  Spouse name: Not on file  ? Number of children: 1  ? Years of education: 22  ? Highest education level: Some college, no degree  ?Occupational History  ?  Employer: [employer]  ? Occupation: IT trainer  ?  Comment: [employer]  ?Tobacco Use  ? Smoking status: Never  ? Smokeless tobacco: Never  ?Vaping Use  ? Vaping Use: Never used  ?Substance and Sexual Activity  ? Alcohol use: Yes  ?  Alcohol/week: 3.0 - 4.0 standard drinks  ?  Types: 3 - 4 Standard drinks or equivalent per week  ?  Comment: several beers and a couple of shots per day  ? Drug use: Not Currently  ?  Types: Marijuana  ? Sexual activity: Not on file  ?Other Topics Concern  ? Not on file  ?Social History Narrative  ? Lives alone  ? Still working full time at [employer]  ? Daughter lives 20 miles away  ? ?Social Determinants of Health  ? ?Financial Resource Strain: Low Risk   ? Difficulty of Paying Living Expenses: Not hard at all  ?Food Insecurity: No Food Insecurity  ? Worried About Charity fundraiser in the Last Year: Never true  ? Ran Out of Food in the Last Year: Never true  ?Transportation Needs: No Transportation Needs  ? Lack of Transportation (Medical): No  ? Lack of Transportation (Non-Medical): No  ?Physical Activity: Insufficiently Active  ? Days of Exercise per Week: 7 days  ? Minutes of Exercise per Session: 10 min  ?Stress: No Stress Concern Present  ? Feeling of Stress : Only a little  ?Social Connections: Moderately Isolated  ? Frequency of Communication with Friends and Family: More than three times a week  ? Frequency of Social Gatherings with Friends and Family: More than three times a week  ? Attends Religious Services: Never  ? Active Member of Clubs or Organizations: Yes  ? Attends Archivist Meetings: More than 4 times per year  ? Marital Status: Divorced  ? ? ?Tobacco Counseling ?Counseling given: Not Answered ? ? ?Clinical Intake: ? ?Pre-visit preparation completed:  Yes ? ?Pain : 0-10 ?Pain Score: 4  ?Pain Type: Chronic pain ?Pain Location: Abdomen ?Pain Orientation: Right, Upper ?Pain Descriptors / Indicators: Aching, Constant ?Pain Onset: More than a month ago ?Pain Frequency: Intermittent ? ?  ? ?BMI - recorded: 28.31 ?Nutritional Status: BMI 25 -29 Overweight ?Nutritional Risks: None ?Diabetes: Yes ?CBG done?: No ?Did pt. bring in CBG monitor from home?: No ? ?How often do you need to have someone help you when you read instructions, pamphlets, or other written materials from your doctor or pharmacy?: 1 - Never ? ?Diabetic? Nutrition Risk Assessment: ? ?Has the patient had any N/V/D within the last 2 months?  No  ?Does the patient have any non-healing wounds?  No  ?Has the patient had any unintentional weight loss or weight gain?  Yes  ? ?Diabetes: ? ?Is the patient diabetic?  Yes  ?If diabetic, was a CBG obtained today?  No  ?Did the patient bring in their glucometer from home?  No  ?How often do you monitor your CBG's? Freestyle Libre CGM - several times per day.  ? ?Financial Strains and Diabetes Management: ? ?Are you having any financial strains with the device, your supplies or your medication? No .  ?Does the patient want to be seen by Chronic Care Management for management of their diabetes?  No  ?Would the patient like to be referred to a Nutritionist or for Diabetic Management?  No  ? ?Diabetic Exams: ? ?Diabetic Eye Exam: Completed 2021. Overdue for diabetic eye exam. Pt has been advised about the importance in completing this exam. He will try to remember to call and make an appt soon. ? ?Diabetic Foot Exam: Completed 07/22/2020. Pt has been advised about the importance in completing this exam. Pt is scheduled for diabetic foot exam on next appt.   ? ?Interpreter Needed?: No ? ?Information entered by :: Zalyn Amend, LPN ? ? ?Activities of Daily Living ? ?  09/05/2021  ?  2:57 PM  ?In your present state of health, do you have any difficulty performing the following  activities:  ?Hearing? 1  ?Vision? 0  ?Difficulty concentrating or making decisions? 0  ?Walking or climbing stairs? 1  ?Dressing or bathing? 0  ?Doing errands, shopping? 0  ?Preparing Food and eating ? N  ?Camelia Phenes

## 2021-09-14 ENCOUNTER — Other Ambulatory Visit: Payer: Self-pay | Admitting: Family Medicine

## 2021-09-14 DIAGNOSIS — E1159 Type 2 diabetes mellitus with other circulatory complications: Secondary | ICD-10-CM

## 2021-09-20 ENCOUNTER — Other Ambulatory Visit: Payer: Self-pay | Admitting: Family Medicine

## 2021-09-20 DIAGNOSIS — E1159 Type 2 diabetes mellitus with other circulatory complications: Secondary | ICD-10-CM

## 2021-09-20 DIAGNOSIS — E782 Mixed hyperlipidemia: Secondary | ICD-10-CM

## 2021-09-21 ENCOUNTER — Encounter: Payer: Self-pay | Admitting: Family Medicine

## 2021-09-21 ENCOUNTER — Ambulatory Visit (INDEPENDENT_AMBULATORY_CARE_PROVIDER_SITE_OTHER): Payer: Medicare Other | Admitting: Family Medicine

## 2021-09-21 VITALS — BP 139/80 | HR 81 | Temp 98.0°F | Ht 71.0 in | Wt 207.0 lb

## 2021-09-21 DIAGNOSIS — G8929 Other chronic pain: Secondary | ICD-10-CM

## 2021-09-21 DIAGNOSIS — E1065 Type 1 diabetes mellitus with hyperglycemia: Secondary | ICD-10-CM

## 2021-09-21 DIAGNOSIS — M2550 Pain in unspecified joint: Secondary | ICD-10-CM

## 2021-09-21 DIAGNOSIS — G4762 Sleep related leg cramps: Secondary | ICD-10-CM | POA: Diagnosis not present

## 2021-09-21 DIAGNOSIS — E1159 Type 2 diabetes mellitus with other circulatory complications: Secondary | ICD-10-CM

## 2021-09-21 DIAGNOSIS — E782 Mixed hyperlipidemia: Secondary | ICD-10-CM | POA: Diagnosis not present

## 2021-09-21 DIAGNOSIS — I152 Hypertension secondary to endocrine disorders: Secondary | ICD-10-CM

## 2021-09-21 DIAGNOSIS — M545 Low back pain, unspecified: Secondary | ICD-10-CM

## 2021-09-21 LAB — BAYER DCA HB A1C WAIVED: HB A1C (BAYER DCA - WAIVED): 6.8 % — ABNORMAL HIGH (ref 4.8–5.6)

## 2021-09-21 MED ORDER — HYDROCODONE-ACETAMINOPHEN 5-325 MG PO TABS
1.0000 | ORAL_TABLET | Freq: Two times a day (BID) | ORAL | 0 refills | Status: DC | PRN
Start: 2021-09-21 — End: 2021-09-27

## 2021-09-21 NOTE — Progress Notes (Addendum)
? ?Assessment & Plan:  ?1. Controlled type 1 diabetes mellitus with hyperglycemia ([place]) ?Lab Results  ?Component Value Date  ? HGBA1C 6.8 (H) 09/21/2021  ? HGBA1C 6.7 (H) 06/23/2021  ? HGBA1C 6.9 12/21/2020  ?  ?- Diabetes is at goal of A1c < 7. ?- Medications: continue current medications ?- Home glucose monitoring: continue using freestyle libre ?- Patient is currently taking a statin. Patient is taking an ACE-inhibitor/ARB.  ?- Instruction/counseling given: reminded to get eye exam and discussed foot care ? ?Diabetes Health Maintenance Due  ?Topic Date Due  ? OPHTHALMOLOGY EXAM  06/09/2020  ? HEMOGLOBIN A1C  03/23/2022  ? FOOT EXAM  09/22/2022  ?  ?Lab Results  ?Component Value Date  ? LABMICR <3.0 09/21/2021  ? ?- Lipid panel ?- CBC with Differential/Platelet ?- CMP14+EGFR ?- Bayer DCA Hb A1c Waived ?- Microalbumin / creatinine urine ratio ? ?2. Hypertension associated with diabetes ([place]) ?Well controlled on current regimen.  ?- Lipid panel ?- CBC with Differential/Platelet ?- CMP14+EGFR ? ?3. Mixed hyperlipidemia ?Well controlled on current regimen.  ?- Lipid panel ?- CMP14+EGFR ? ?4. Nocturnal leg cramps ?- CBC with Differential/Platelet ?- CMP14+EGFR ?- Magnesium ?- TSH ? ?5-6. Chronic bilateral low back pain without sciatica/Polyarthralgia ?New start of [place]. Patient will return for controlled substance agreement and urine drug screen.  ?- HYDROcodone-acetaminophen (NORCO) 5-325 MG tablet; Take 1 tablet by mouth 2 (two) times daily as needed for moderate pain.  Dispense: 60 tablet; Refill: 0 ? ? ?Return in about 4 weeks (around 10/19/2021) for pain contract. ? ?Hendricks Limes, MSN, APRN, FNP-C ?[place] ? ?Subjective:  ? ? Patient ID: Dustin Graham, male    DOB: 1950-12-18, 71 y.o.   MRN: [id number] ? ?Patient Care Team: ?Loman Brooklyn, FNP as PCP - General (Family Medicine) ?Lavera Guise, [facility] (Pharmacist) ?Okey Regal, OD (Optometry) ?Daryel November, MD as Consulting  Physician (Gastroenterology)  ? ?Chief Complaint:  ?Chief Complaint  ?Patient presents with  ? Medical Management of Chronic Issues  ? leg craps  ?  X 1-2 months bilateral leg cramps   ? ? ?HPI: ?Dustin Graham is a 71 y.o. male presenting on 09/21/2021 for Medical Management of Chronic Issues and leg craps (X 1-2 months bilateral leg cramps ) ? ?Type 1 Diabetes: Patient presents for follow up of diabetes. Current symptoms include: hyperglycemia and hypoglycemia . Known diabetic complications: none. Medication compliance: currently taking Tresiba 20 units in the morning and 18 units in the evening, as well as using his Novolog with meals based on what he is eating (up to 22 units).  He reports when he increased to 20 twice daily (due to elevated glucose readings) he started having lows during the night, so he decreased his nightly dose back down.  Current diet: in general, a "healthy" diet  , but admits when his sugars drop below he probably eats too much sugar to bring it back up . Home blood sugar records:  uses [place] which is active 97% of the time.   He is in target range 70% of the time. His best day over the past two weeks was yesterday, when asked what was different about yesterday, he reports he didn't have to work. Is he  on ACE inhibitor or angiotensin II receptor blocker? Yes (Enalapril). Is he on a statin? Yes (Atorvastatin). ? ?Hypertension: patient does not check BP at home.  ? ?New Complaints: ? ?Patient reports chronic pain in his back and left shoulder. He  does have a history of a kyphoplasty at T12 due to a compression fracture.  ? 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 ? 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 Mild bilateral bony foraminal narrowing at those levels.  ? ?Previous left shoulder x-ray was negative. He is not  interested in completing a MRI as he is not interested in further surgeries.  ? ?He has done physical therapy guided exercises at home for his shoulder. He has taken diclofenac and Tramadol in the past for his chronic pain and reports they are not effective. He has had Norco before and reports it was helpful.  ? ? ?Patient also reports bilateral leg cramps for the past 1-2 months that occur during the night several nights per week. He does feel he stays well hydrated.  ? ? ?Social history: ? ?Relevant past medical, surgical, family and social history reviewed and updated as indicated. Interim medical history since our last visit reviewed. ? ?Allergies and medications reviewed and updated. ? ?DATA REVIEWED: CHART IN EPIC ? ?ROS: Negative unless specifically indicated above in HPI.  ? ? ?Current Outpatient Medications:  ?  Aspirin-Salicylamide-Caffeine (BC HEADACHE POWDER PO), Take by mouth., Disp: , Rfl:  ?  atorvastatin (LIPITOR) 40 MG tablet, TAKE 1 TABLET BY MOUTH EVERY DAY, Disp: 90 tablet, Rfl: 0 ?  Continuous Blood Gluc Receiver (FREESTYLE LIBRE 2 READER) DEVI, Use to test blood sugar up to 6 times daily. DX; E11.9, Disp: 1 each, Rfl: 0 ?  Continuous Blood Gluc Sensor (FREESTYLE LIBRE 2 SENSOR) MISC, Use to test blood sugar up to 6 times daily. DX: E11.9, Disp: 2 each, Rfl: 11 ?  diclofenac (VOLTAREN) 75 MG EC tablet, TAKE 1 TABLET BY MOUTH TWICE A DAY, Disp: 30 tablet, Rfl: 0 ?  enalapril (VASOTEC) 20 MG tablet, Take 1 tablet (20 mg total) by mouth daily. Must be seen for any further refills, Disp: 90 tablet, Rfl: 0 ?  glucose blood (CONTOUR NEXT TEST) test strip, Test 3 times a day. Dx. E11.9, Disp: 100 each, Rfl: 3 ?  insulin aspart (NOVOLOG FLEXPEN) 100 UNIT/ML FlexPen, Inject 30 Units into the skin 3 (three) times daily with meals., Disp: 90 mL, Rfl: 0 ?  Insulin Pen Needle (BD PEN NEEDLE MICRO U/F) 32G X 6 MM MISC, Use with injection 6 times daily Dx E10.9, Disp: 600 each, Rfl: 3 ?  omeprazole (PRILOSEC)  20 MG capsule, Take 1 capsule (20 mg total) by mouth daily., Disp: 90 capsule, Rfl: 1 ?  OneTouch Delica Lancets 37C MISC, , Disp: , Rfl:  ?  TRESIBA FLEXTOUCH 200 UNIT/ML FlexTouch Pen, INJECT 18 UNITS INTO THE SKIN IN THE MORNING AND AT BEDTIME., Disp: 9 mL, Rfl: 0 ?  meloxicam (MOBIC) 7.5 MG tablet, Take 1 tablet (7.5 mg total) by mouth daily. (Patient not taking: Reported on 09/21/2021), Disp: 42 tablet, Rfl: 0 ?  traMADol (ULTRAM) 50 MG tablet, Take 50 mg by mouth every 6 (six) hours as needed. (Patient not taking: Reported on 09/21/2021), Disp: , Rfl:   ? ?Allergies  ?Allergen Reactions  ? Codeine Nausea And Vomiting  ? ?Past Medical History:  ?Diagnosis Date  ? GERD (gastroesophageal reflux disease)   ? HLD (hyperlipidemia)   ? Hypertension associated with diabetes ([place]) 02/29/2016  ? Rosacea 01/28/2019  ? Type 1 diabetes ([place])   ?  ?Past Surgical History:  ?  Procedure Laterality Date  ? broken nose    ? CHOLECYSTECTOMY    ? GALLBLADDER SURGERY    ? LUMBAR DISC SURGERY    ? fracture  ?  ?Social History  ? ?Socioeconomic History  ? Marital status: Divorced  ?  Spouse name: Not on file  ? Number of children: 1  ? Years of education: 2  ? Highest education level: Some college, no degree  ?Occupational History  ?  Employer: [employer]  ? Occupation: IT trainer  ?  Comment: [employer]  ?Tobacco Use  ? Smoking status: Never  ? Smokeless tobacco: Never  ?Vaping Use  ? Vaping Use: Never used  ?Substance and Sexual Activity  ? Alcohol use: Yes  ?  Alcohol/week: 3.0 - 4.0 standard drinks  ?  Types: 3 - 4 Standard drinks or equivalent per week  ?  Comment: several beers and a couple of shots per day  ? Drug use: Not Currently  ?  Types: Marijuana  ? Sexual activity: Not on file  ?Other Topics Concern  ? Not on file  ?Social History Narrative  ? Lives alone  ? Still working full time at [employer]  ? Daughter lives 20 miles away  ? ?Social Determinants of Health  ? ?Financial Resource Strain:  Low Risk   ? Difficulty of Paying Living Expenses: Not hard at all  ?Food Insecurity: No Food Insecurity  ? Worried About Charity fundraiser in the Last Year: Never true  ? Ran Out of Food in the Last Year:

## 2021-09-21 NOTE — Patient Instructions (Signed)
Complete your Cologuard. ? ?Check price of tetanus and shingles at the pharmacy.  ?

## 2021-09-22 ENCOUNTER — Encounter: Payer: Self-pay | Admitting: Family Medicine

## 2021-09-22 ENCOUNTER — Ambulatory Visit: Payer: Medicare Other | Admitting: Family Medicine

## 2021-09-22 LAB — CBC WITH DIFFERENTIAL/PLATELET
Basophils Absolute: 0.1 10*3/uL (ref 0.0–0.2)
Basos: 1 %
EOS (ABSOLUTE): 0.3 10*3/uL (ref 0.0–0.4)
Eos: 4 %
Hematocrit: 37.3 % — ABNORMAL LOW (ref 37.5–51.0)
Hemoglobin: 12.9 g/dL — ABNORMAL LOW (ref 13.0–17.7)
Immature Grans (Abs): 0 10*3/uL (ref 0.0–0.1)
Immature Granulocytes: 0 %
Lymphocytes Absolute: 2.6 10*3/uL (ref 0.7–3.1)
Lymphs: 35 %
MCH: 33.1 pg — ABNORMAL HIGH (ref 26.6–33.0)
MCHC: 34.6 g/dL (ref 31.5–35.7)
MCV: 96 fL (ref 79–97)
Monocytes Absolute: 0.5 10*3/uL (ref 0.1–0.9)
Monocytes: 7 %
Neutrophils Absolute: 3.8 10*3/uL (ref 1.4–7.0)
Neutrophils: 53 %
Platelets: 171 10*3/uL (ref 150–450)
RBC: 3.9 x10E6/uL — ABNORMAL LOW (ref 4.14–5.80)
RDW: 12.4 % (ref 11.6–15.4)
WBC: 7.2 10*3/uL (ref 3.4–10.8)

## 2021-09-22 LAB — CMP14+EGFR
ALT: 33 IU/L (ref 0–44)
AST: 28 IU/L (ref 0–40)
Albumin/Globulin Ratio: 1.6 (ref 1.2–2.2)
Albumin: 4.1 g/dL (ref 3.8–4.8)
Alkaline Phosphatase: 93 IU/L (ref 44–121)
BUN/Creatinine Ratio: 17 (ref 10–24)
BUN: 15 mg/dL (ref 8–27)
Bilirubin Total: 0.8 mg/dL (ref 0.0–1.2)
CO2: 23 mmol/L (ref 20–29)
Calcium: 9.3 mg/dL (ref 8.6–10.2)
Chloride: 100 mmol/L (ref 96–106)
Creatinine, Ser: 0.9 mg/dL (ref 0.76–1.27)
Globulin, Total: 2.5 g/dL (ref 1.5–4.5)
Glucose: 166 mg/dL — ABNORMAL HIGH (ref 70–99)
Potassium: 4.9 mmol/L (ref 3.5–5.2)
Sodium: 134 mmol/L (ref 134–144)
Total Protein: 6.6 g/dL (ref 6.0–8.5)
eGFR: 92 mL/min/1.73 (ref >59)

## 2021-09-22 LAB — LIPID PANEL
Chol/HDL Ratio: 2.2 ratio (ref 0.0–5.0)
Cholesterol, Total: 177 mg/dL (ref 100–199)
HDL: 79 mg/dL (ref >39)
LDL Chol Calc (NIH): 76 mg/dL (ref 0–99)
Triglycerides: 126 mg/dL (ref 0–149)
VLDL Cholesterol Cal: 22 mg/dL (ref 5–40)

## 2021-09-22 LAB — MAGNESIUM: Magnesium: 1.9 mg/dL (ref 1.6–2.3)

## 2021-09-22 LAB — MICROALBUMIN / CREATININE URINE RATIO
Creatinine, Urine: 97.4 mg/dL
Microalb/Creat Ratio: <3 mg/g{creat} (ref 0–29)
Microalbumin, Urine: <3 ug/mL

## 2021-09-22 LAB — TSH: TSH: 1.33 u[IU]/mL (ref 0.450–4.500)

## 2021-09-24 ENCOUNTER — Other Ambulatory Visit: Payer: Self-pay | Admitting: Gastroenterology

## 2021-09-25 ENCOUNTER — Encounter: Payer: Self-pay | Admitting: Family Medicine

## 2021-09-27 ENCOUNTER — Telehealth: Payer: Self-pay | Admitting: Family Medicine

## 2021-09-27 MED ORDER — TRAMADOL HCL 50 MG PO TABS: 50.0000 mg | ORAL_TABLET | Freq: Two times a day (BID) | ORAL | 0 refills | Status: DC | PRN

## 2021-09-27 NOTE — Telephone Encounter (Signed)
Tramadol sent. 

## 2021-09-27 NOTE — Telephone Encounter (Signed)
Patient aware and states he would just like something else sent [facility] to [facility] [place] ?

## 2021-09-27 NOTE — Telephone Encounter (Signed)
Please ask him to find a pharmacy where we can send his new prescription.  I know a lot of people have been getting it at [facility]. ?

## 2021-09-27 NOTE — Telephone Encounter (Signed)
Patient informed. 

## 2021-09-27 NOTE — Telephone Encounter (Signed)
Spoke with patient and states [facility] [place] [facility] are our of hydrocodone since it is on national back order.  Would like something else to be sent in.  ?

## 2021-09-28 ENCOUNTER — Telehealth: Payer: Self-pay | Admitting: Family Medicine

## 2021-09-28 DIAGNOSIS — E1159 Type 2 diabetes mellitus with other circulatory complications: Secondary | ICD-10-CM

## 2021-09-28 MED ORDER — TRESIBA FLEXTOUCH 200 UNIT/ML ~~LOC~~ SOPN: 18.0000 [IU] | PEN_INJECTOR | Freq: Two times a day (BID) | SUBCUTANEOUS | 0 refills | Status: DC

## 2021-09-28 NOTE — Telephone Encounter (Signed)
Rx sent- patient aware.  

## 2021-09-28 NOTE — Telephone Encounter (Signed)
?  Prescription Request ? ?09/28/2021 ? ?Is this a "Controlled Substance" medicine? no ? ?Have you seen your PCP in the last 2 weeks? Yes 09/21/2021 ? ?If YES, route message to pool  -  If NO, patient needs to be scheduled for appointment. ? ?What is the name of the medication or equipment? TRESIBA FLEXTOUCH 200 UNIT/ML FlexTouch Pen ? ?Have you contacted your pharmacy to request a refill? yes  ? ?Which pharmacy would you like this sent to? [facility] [place]  ? ? ?Patient notified that their request is being sent to the clinical staff for review and that they should receive a response within 2 business days.  ? ? ?

## 2021-10-12 ENCOUNTER — Encounter: Payer: Self-pay | Admitting: Gastroenterology

## 2021-10-12 ENCOUNTER — Other Ambulatory Visit (INDEPENDENT_AMBULATORY_CARE_PROVIDER_SITE_OTHER): Payer: Medicare Other

## 2021-10-12 ENCOUNTER — Ambulatory Visit (INDEPENDENT_AMBULATORY_CARE_PROVIDER_SITE_OTHER): Payer: Medicare Other | Admitting: Gastroenterology

## 2021-10-12 VITALS — BP 132/78 | HR 105 | Ht 72.0 in | Wt 209.2 lb

## 2021-10-12 DIAGNOSIS — D649 Anemia, unspecified: Secondary | ICD-10-CM

## 2021-10-12 DIAGNOSIS — R1010 Upper abdominal pain, unspecified: Secondary | ICD-10-CM

## 2021-10-12 DIAGNOSIS — R1084 Generalized abdominal pain: Secondary | ICD-10-CM

## 2021-10-12 LAB — IBC + FERRITIN
Ferritin: 51 ng/mL (ref 22.0–322.0)
Iron: 81 ug/dL (ref 42–165)
Saturation Ratios: 20.1 % (ref 20.0–50.0)
TIBC: 403.2 ug/dL (ref 250.0–450.0)
Transferrin: 288 mg/dL (ref 212.0–360.0)

## 2021-10-12 NOTE — Progress Notes (Signed)
HPI : Dustin Graham is a very pleasant 71 year old male with type 1 DM well known to me for persistent abdominal pain and alcohol use disorder who presents to clinic for follow up of abdominal pain.  Please see previous notes for detailed description of his pain.  He reports that the pain is still present and is essentially always there.  He thinks the Meloxicam is helping some. He did note that he had a particularly bad episode of pain following eating fried chicken a few weeks ago.  Although he typically reports that his pain is exacerbated by body position and movements at work (sitting in chair for prolonged periods, turning/twisting, worse at the end of the day), this time he noted that he had significant pain shortly after eating this fried chicken.  His bowel movements continue to be regular, with formed brown stools most of the time.  Sometimes he will miss a day and will have some hard stools and straining.  Diarrhea is not a problem for him.  No blood in stool.  He recently had labs done and was noted to have a small drop in his hemoglobin (12.9 from baseline 14).  He is still not completed the Cologuard test that had previously been ordered.    He continues to drink several drinks a night, typically these 1 Crown Royal drink and a few beers.   Past Medical History:  Diagnosis Date   GERD (gastroesophageal reflux disease)    HLD (hyperlipidemia)    Hypertension associated with diabetes ([place]) 02/29/2016   Rosacea 01/28/2019   Type 1 diabetes ([place])      Past Surgical History:  Procedure Laterality Date   broken nose     CHOLECYSTECTOMY     GALLBLADDER SURGERY     LUMBAR DISC SURGERY     fracture   Family History  Problem Relation Age of Onset   Peptic Ulcer Father    Diabetes Paternal Aunt    Colon cancer Neg Hx    Esophageal cancer Neg Hx    Stomach cancer Neg Hx    Colon polyps Neg Hx    Social History   Tobacco Use   Smoking status: Never   Smokeless tobacco:  Never  Vaping Use   Vaping Use: Never used  Substance Use Topics   Alcohol use: Yes    Alcohol/week: 3.0 - 4.0 standard drinks    Types: 3 - 4 Standard drinks or equivalent per week    Comment: several beers and a couple of shots per day   Drug use: Not Currently    Types: Marijuana   Current Outpatient Medications  Medication Sig Dispense Refill   Aspirin-Salicylamide-Caffeine (BC HEADACHE POWDER PO) Take by mouth.     atorvastatin (LIPITOR) 40 MG tablet TAKE 1 TABLET BY MOUTH EVERY DAY 90 tablet 0   Continuous Blood Gluc Receiver (FREESTYLE LIBRE 2 READER) DEVI Use to test blood sugar up to 6 times daily. DX; E11.9 1 each 0   Continuous Blood Gluc Sensor (FREESTYLE LIBRE 2 SENSOR) MISC Use to test blood sugar up to 6 times daily. DX: E11.9 2 each 11   enalapril (VASOTEC) 20 MG tablet Take 1 tablet (20 mg total) by mouth daily. Must be seen for any further refills 90 tablet 0   glucose blood (CONTOUR NEXT TEST) test strip Test 3 times a day. Dx. E11.9 100 each 3   insulin aspart (NOVOLOG FLEXPEN) 100 UNIT/ML FlexPen Inject 30 Units into the skin 3 (  three) times daily with meals. 90 mL 0   insulin degludec (TRESIBA FLEXTOUCH) 200 UNIT/ML FlexTouch Pen Inject 18 Units into the skin in the morning and at bedtime. 9 mL 0   Insulin Pen Needle (BD PEN NEEDLE MICRO U/F) 32G X 6 MM MISC Use with injection 6 times daily Dx E10.9 600 each 3   meloxicam (MOBIC) 15 MG tablet Take 15 mg by mouth daily.     omeprazole (PRILOSEC) 20 MG capsule Take 1 capsule (20 mg total) by mouth daily. 90 capsule 1   OneTouch Delica Lancets 97D MISC      traMADol (ULTRAM) 50 MG tablet Take 1 tablet (50 mg total) by mouth every 12 (twelve) hours as needed. 60 tablet 0   No current facility-administered medications for this visit.   Allergies  Allergen Reactions   Codeine Nausea And Vomiting     Review of Systems: All systems reviewed and negative except where noted in HPI.    No results found.  Physical  Exam: BP 132/78   Pulse (!) 105   Ht 6' (1.829 m)   Wt 209 lb 3.2 oz (94.9 kg)   SpO2 97%   BMI 28.37 kg/m  Constitutional: Pleasant,well-developed, Caucasian male in no acute distress. HEENT: Normocephalic and atraumatic. Conjunctivae are normal. No scleral icterus. Neck supple.  Cardiovascular: Normal rate, regular rhythm.  Pulmonary/chest: Effort normal and breath sounds normal. No wheezing, rales or rhonchi. Abdominal: Soft, nondistended, focal tenderness to palpation in the right upper quadrant, worse with engagement of abdominus rectus (Carnett's sign positive). Bowel sounds active throughout. There are no masses palpable. No hepatomegaly. Extremities: no edema Neurological: Alert and oriented to person place and time. Skin: Skin is warm and dry. No rashes noted. Psychiatric: Normal mood and affect. Behavior is normal.  CBC    Component Value Date/Time   WBC 7.2 09/21/2021 1309   WBC 8.2 04/05/2021 1515   RBC 3.90 (L) 09/21/2021 1309   RBC 4.32 04/05/2021 1515   HGB 12.9 (L) 09/21/2021 1309   HCT 37.3 (L) 09/21/2021 1309   PLT 171 09/21/2021 1309   MCV 96 09/21/2021 1309   MCH 33.1 (H) 09/21/2021 1309   MCHC 34.6 09/21/2021 1309   MCHC 33.6 04/05/2021 1515   RDW 12.4 09/21/2021 1309   LYMPHSABS 2.6 09/21/2021 1309   MONOABS 0.6 04/05/2021 1515   EOSABS 0.3 09/21/2021 1309   BASOSABS 0.1 09/21/2021 1309    CMP     Component Value Date/Time   NA 134 09/21/2021 1309   K 4.9 09/21/2021 1309   CL 100 09/21/2021 1309   CO2 23 09/21/2021 1309   GLUCOSE 166 (H) 09/21/2021 1309   GLUCOSE 180 (H) 08/16/2021 1234   BUN 15 09/21/2021 1309   CREATININE 0.90 09/21/2021 1309   CALCIUM 9.3 09/21/2021 1309   PROT 6.6 09/21/2021 1309   ALBUMIN 4.1 09/21/2021 1309   AST 28 09/21/2021 1309   ALT 33 09/21/2021 1309   ALKPHOS 93 09/21/2021 1309   BILITOT 0.8 09/21/2021 1309   GFRNONAA 102 07/22/2020 0916   GFRAA 118 07/22/2020 0916     ASSESSMENT AND PLAN: 71 year old  male with chronic right upper quadrant abdominal pain.  EGD and ultrasound unremarkable.  The patient's report of his pain does seem most consistent with a musculoskeletal/abdominal wall pain, and his physical exam was also more suggestive of this today.  He had some improvement with the Mobic.  We will plan for abdominal wall injection to see if this resolves  his pain more completely.  Given the patient is a fairly heavy drinker and reported an exacerbation of his pain with a fatty meal, I think evaluating for chronic pancreatitis is reasonable.  We will get fecal elastase. Patient had a slight drop in hemoglobin.  He is still not completed his colon cancer screening.  We will get iron panel today.  If suggestive of iron deficiency, I would strongly recommend colonoscopy and lieu of Cologuard.  Abdominal pain, suspect abdominal wall -Schedule for abdominal wall injection -Fecal elastase to evaluate for chronic pancreatitis  Anemia, mild, normocytic -Iron panel  Anastazja Isaac E. Candis Schatz, MD [facility]  CC:  Loman Brooklyn, FNP

## 2021-10-12 NOTE — Patient Instructions (Signed)
If you are age 71 or older, your body mass index should be between 23-30. Your Body mass index is 28.37 kg/m. If this is out of the aforementioned range listed, please consider follow up with your Primary Care Provider.  If you are age 37 or younger, your body mass index should be between 19-25. Your Body mass index is 28.37 kg/m. If this is out of the aformentioned range listed, please consider follow up with your Primary Care Provider.   Your provider has requested that you go to the basement level for lab work before leaving today. Press "B" on the elevator. The lab is located at the first door on the left as you exit the elevator.    The  GI providers would like to encourage you to use [facility] to communicate with providers for non-urgent requests or questions.  Due to long hold times on the telephone, sending your provider a message by [facility] may be a faster and more efficient way to get a response.  Please allow 48 business hours for a response.  Please remember that this is for non-urgent requests.   Due to recent changes in healthcare laws, you may see the results of your imaging and laboratory studies on MyChart before your provider has had a chance to review them.  We understand that in some cases there may be results that are confusing or concerning to you. Not all laboratory results come back in the same time frame and the provider may be waiting for multiple results in order to interpret others.  Please give [place] 48 hours in order for your provider to thoroughly review all the results before contacting the office for clarification of your results.    It was a pleasure to see you today!  Thank you for trusting me with your gastrointestinal care!

## 2021-10-17 NOTE — Progress Notes (Signed)
Mr. Dustin Graham, Your iron panel was completely normal, with no suggestion of iron deficiency anemia.  I still recommend you complete the Cologuard for colon cancer screening.  Please also submit the stool sample for the fecal elastase test.  We will see you in a few weeks for your abdominal wall injection.

## 2021-10-19 ENCOUNTER — Encounter: Payer: Self-pay | Admitting: Family Medicine

## 2021-10-19 ENCOUNTER — Ambulatory Visit (INDEPENDENT_AMBULATORY_CARE_PROVIDER_SITE_OTHER): Payer: Medicare Other | Admitting: Family Medicine

## 2021-10-19 VITALS — BP 144/74 | HR 102 | Temp 98.5°F | Ht 73.0 in | Wt 208.6 lb

## 2021-10-19 DIAGNOSIS — M545 Low back pain, unspecified: Secondary | ICD-10-CM

## 2021-10-19 DIAGNOSIS — M2550 Pain in unspecified joint: Secondary | ICD-10-CM | POA: Diagnosis not present

## 2021-10-19 DIAGNOSIS — Z79899 Other long term (current) drug therapy: Secondary | ICD-10-CM

## 2021-10-19 DIAGNOSIS — G8929 Other chronic pain: Secondary | ICD-10-CM | POA: Diagnosis not present

## 2021-10-19 MED ORDER — TRAMADOL HCL 50 MG PO TABS: 50.0000 mg | ORAL_TABLET | Freq: Two times a day (BID) | ORAL | 2 refills | Status: DC | PRN

## 2021-10-19 NOTE — Progress Notes (Signed)
Assessment & Plan:  1-3. Chronic bilateral low back pain without sciatica/Polyarthralgia/Controlled substance agreement signed Well controlled on current regimen.  Controlled substance agreement signed today.  Urine drug screen collected today.  PDMP reviewed with no concerning findings. - traMADol (ULTRAM) 50 MG tablet; Take 1 tablet (50 mg total) by mouth every 12 (twelve) hours as needed.  Dispense: 60 tablet; Refill: 2 - ToxASSURE Select 13 (MW), Urine   Return in about 3 months (around 01/19/2022) for follow-up of chronic medication conditions.  [name], MSN, APRN, FNP-C [facility]  Subjective:    Patient ID: Dustin Graham, male    DOB: 23-Jan-1951, 71 y.o.   MRN: [id number]  Patient Care Team: Gwenlyn Fudge, FNP as PCP - General (Family Medicine) Danella Maiers, [facility] (Pharmacist) Smitty Cords, OD (Optometry) Jenel Lucks, MD as Consulting Physician (Gastroenterology)   Chief Complaint:  Chief Complaint  Patient presents with   Pain Management    4 week pain contact     HPI: Dustin Graham is a 71 y.o. male presenting on 10/19/2021 for Pain Management (4 week pain contact )  Pain assessment: Cause of pain- He does have a history of a kyphoplasty at T12 due to a compression fracture.    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 Mild bilateral bony foraminal narrowing at those levels.    Previous left shoulder x-ray was negative. He is not interested in completing a MRI as he is not interested in further surgeries.   Pain location- back and left shoulder Pain on scale of 1-10- 7/10 without medication and 4-5/10 with medication Frequency- daily What increases pain- pain is constant What makes pain better-  medication Effects on ADL- makes it slower Any change in general medical condition- no  Current opioids rx- Tramadol # meds rx- 60 Effectiveness of current meds- good Adverse reactions from pain meds- none Morphine equivalent- 10 MME/day  Pill count performed-No Last drug screen - never ( high risk q39m, moderate risk q68m, low risk yearly ) Urine drug screen today- Yes Was the NCCSR reviewed- Yes  If yes were their any concerning findings? - No  Overdose risk: 300     10/19/2021    3:49 PM  Opioid Risk   Alcohol 3  Illegal Drugs 0  Rx Drugs 0  Alcohol 0  Illegal Drugs 0  Rx Drugs 0  Age between 16-45 years  0  History of Preadolescent Sexual Abuse 0  Psychological Disease 0  Depression 0  Opioid Risk Tool Scoring 3  Opioid Risk Interpretation Low Risk   Pain contract signed on: 10/19/2021  New complaints: None   Social history:  Relevant past medical, surgical, family and social history reviewed and updated as indicated. Interim medical history since our last visit reviewed.  Allergies and medications reviewed and updated.  DATA REVIEWED: CHART IN EPIC  ROS: Negative unless specifically indicated above in HPI.    Current Outpatient Medications:    Aspirin-Salicylamide-Caffeine (BC HEADACHE POWDER PO), Take by mouth., Disp: , Rfl:    atorvastatin (LIPITOR) 40 MG tablet, TAKE 1 TABLET BY MOUTH EVERY DAY, Disp: 90 tablet, Rfl: 0   Continuous Blood Gluc Receiver (FREESTYLE LIBRE 2 READER) DEVI, Use to test blood sugar up to 6 times daily. DX; E11.9, Disp: 1 each, Rfl:  0   Continuous Blood Gluc Sensor (FREESTYLE LIBRE 2 SENSOR) MISC, Use to test blood sugar up to 6 times daily. DX: E11.9, Disp: 2 each, Rfl: 11   enalapril (VASOTEC) 20 MG tablet, Take 1 tablet (20 mg total) by mouth daily. Must be seen for any further refills, Disp: 90 tablet, Rfl: 0   glucose blood (CONTOUR NEXT TEST) test strip, Test 3 times a day. Dx. E11.9, Disp: 100 each, Rfl: 3   insulin  aspart (NOVOLOG FLEXPEN) 100 UNIT/ML FlexPen, Inject 30 Units into the skin 3 (three) times daily with meals., Disp: 90 mL, Rfl: 0   insulin degludec (TRESIBA FLEXTOUCH) 200 UNIT/ML FlexTouch Pen, Inject 18 Units into the skin in the morning and at bedtime., Disp: 9 mL, Rfl: 0   Insulin Pen Needle (BD PEN NEEDLE MICRO U/F) 32G X 6 MM MISC, Use with injection 6 times daily Dx E10.9, Disp: 600 each, Rfl: 3   meloxicam (MOBIC) 15 MG tablet, Take 15 mg by mouth daily., Disp: , Rfl:    omeprazole (PRILOSEC) 20 MG capsule, Take 1 capsule (20 mg total) by mouth daily., Disp: 90 capsule, Rfl: 1   OneTouch Delica Lancets 33G MISC, , Disp: , Rfl:    traMADol (ULTRAM) 50 MG tablet, Take 1 tablet (50 mg total) by mouth every 12 (twelve) hours as needed., Disp: 60 tablet, Rfl: 0   Allergies  Allergen Reactions   Codeine Nausea And Vomiting   Past Medical History:  Diagnosis Date   GERD (gastroesophageal reflux disease)    HLD (hyperlipidemia)    Hypertension associated with diabetes ([facility]) 02/29/2016   Rosacea 01/28/2019   Type 1 diabetes ([facility])     Past Surgical History:  Procedure Laterality Date   broken nose     CHOLECYSTECTOMY     GALLBLADDER SURGERY     LUMBAR DISC SURGERY     fracture    Social History   Socioeconomic History   Marital status: Divorced    Spouse name: Not on file   Number of children: 1   Years of education: 12   Highest education level: Some college, no degree  Occupational History    Employer: [employer]   Occupation: Estate manager/land agent    Comment: [employer]  Tobacco Use   Smoking status: Never   Smokeless tobacco: Never  Vaping Use   Vaping Use: Never used  Substance and Sexual Activity   Alcohol use: Yes    Alcohol/week: 3.0 - 4.0 standard drinks    Types: 3 - 4 Standard drinks or equivalent per week    Comment: several beers and a couple of shots per day   Drug use: Not Currently    Types: Marijuana   Sexual activity: Not on file   Other Topics Concern   Not on file  Social History Narrative   Lives alone   Still working full time at [employer]   Daughter lives 20 miles away   Social Determinants of Health   Financial Resource Strain: Low Risk    Difficulty of Paying Living Expenses: Not hard at all  Food Insecurity: No Food Insecurity   Worried About Programme researcher, broadcasting/film/video in the Last Year: Never true   Barista in the Last Year: Never true  Transportation Needs: No Transportation Needs   Lack of Transportation (Medical): No   Lack of Transportation (Non-Medical): No  Physical Activity: Insufficiently Active   Days of Exercise per Week: 7 days  Minutes of Exercise per Session: 10 min  Stress: No Stress Concern Present   Feeling of Stress : Only a little  Social Connections: Moderately Isolated   Frequency of Communication with Friends and Family: More than three times a week   Frequency of Social Gatherings with Friends and Family: More than three times a week   Attends Religious Services: Never   Marine scientist or Organizations: Yes   Attends Music therapist: More than 4 times per year   Marital Status: Divorced  Human resources officer Violence: Not At Risk   Fear of Current or Ex-Partner: No   Emotionally Abused: No   Physically Abused: No   Sexually Abused: No        Objective:    BP (!) 144/74   Pulse (!) 102   Temp 98.5 F (36.9 C) (Temporal)   Ht $R'6\' 1"'Kv$  (1.854 m)   Wt 208 lb 9.6 oz (94.6 kg)   SpO2 96%   BMI 27.52 kg/m   Wt Readings from Last 3 Encounters:  10/19/21 208 lb 9.6 oz (94.6 kg)  10/12/21 209 lb 3.2 oz (94.9 kg)  09/21/21 207 lb (93.9 kg)    Physical Exam Vitals reviewed.  Constitutional:      General: He is not in acute distress.    Appearance: Normal appearance. He is not ill-appearing, toxic-appearing or diaphoretic.  HENT:     Head: Normocephalic and atraumatic.  Eyes:     General: No scleral icterus.       Right eye: No discharge.         Left eye: No discharge.     Conjunctiva/sclera: Conjunctivae normal.  Cardiovascular:     Rate and Rhythm: Normal rate.  Pulmonary:     Effort: Pulmonary effort is normal. No respiratory distress.  Musculoskeletal:        General: Normal range of motion.     Cervical back: Normal range of motion.  Skin:    General: Skin is warm and dry.  Neurological:     Mental Status: He is alert and oriented to person, place, and time. Mental status is at baseline.  Psychiatric:        Mood and Affect: Mood normal.        Behavior: Behavior normal.        Thought Content: Thought content normal.        Judgment: Judgment normal.    Lab Results  Component Value Date   TSH 1.330 09/21/2021   Lab Results  Component Value Date   WBC 7.2 09/21/2021   HGB 12.9 (L) 09/21/2021   HCT 37.3 (L) 09/21/2021   MCV 96 09/21/2021   PLT 171 09/21/2021   Lab Results  Component Value Date   NA 134 09/21/2021   K 4.9 09/21/2021   CO2 23 09/21/2021   GLUCOSE 166 (H) 09/21/2021   BUN 15 09/21/2021   CREATININE 0.90 09/21/2021   BILITOT 0.8 09/21/2021   ALKPHOS 93 09/21/2021   AST 28 09/21/2021   ALT 33 09/21/2021   PROT 6.6 09/21/2021   ALBUMIN 4.1 09/21/2021   CALCIUM 9.3 09/21/2021   EGFR 92 09/21/2021   GFR 92.74 08/16/2021   Lab Results  Component Value Date   CHOL 177 09/21/2021   Lab Results  Component Value Date   HDL 79 09/21/2021   Lab Results  Component Value Date   LDLCALC 76 09/21/2021   Lab Results  Component Value Date   TRIG 126 09/21/2021  Lab Results  Component Value Date   CHOLHDL 2.2 09/21/2021   Lab Results  Component Value Date   HGBA1C 6.8 (H) 09/21/2021

## 2021-10-24 LAB — TOXASSURE SELECT 13 (MW), URINE

## 2021-11-07 ENCOUNTER — Ambulatory Visit (INDEPENDENT_AMBULATORY_CARE_PROVIDER_SITE_OTHER): Payer: Medicare Other | Admitting: Gastroenterology

## 2021-11-07 ENCOUNTER — Encounter: Payer: Self-pay | Admitting: Gastroenterology

## 2021-11-07 VITALS — BP 134/80 | HR 64 | Ht 72.0 in | Wt 208.0 lb

## 2021-11-07 DIAGNOSIS — G589 Mononeuropathy, unspecified: Secondary | ICD-10-CM | POA: Diagnosis not present

## 2021-11-07 DIAGNOSIS — R109 Unspecified abdominal pain: Secondary | ICD-10-CM

## 2021-11-07 NOTE — Patient Instructions (Signed)
If you are age 71 or older, your body mass index should be between 23-30. Your Body mass index is 28.21 kg/m. If this is out of the aforementioned range listed, please consider follow up with your Primary Care Provider.  If you are age 13 or younger, your body mass index should be between 19-25. Your Body mass index is 28.21 kg/m. If this is out of the aformentioned range listed, please consider follow up with your Primary Care Provider.   Follow up as needed.  The [place] [facility] providers would like to encourage you to use [facility] to communicate with providers for non-urgent requests or questions.  Due to long hold times on the telephone, sending your provider a message by [facility] may be a faster and more efficient way to get a response.  Please allow 48 business hours for a response.  Please remember that this is for non-urgent requests.   It was a pleasure to see you today!  Thank you for trusting me with your gastrointestinal care!    Scott E.Candis Schatz, MD

## 2021-11-07 NOTE — Progress Notes (Unsigned)
P  Chief Complaint:    Abdominal pain, Abdominal Wall Syndrome  GI History:  HPI:     Patient is a 71 y.o. malewith a history of chronic pain in the *** presenting to the Gastroenterology Clinic for evaluation and treatment. 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  No change in medical or surgical history, medications, allergies, social history since last appointment with me.   Review of systems:     No chest pain, no SOB, no fevers, no urinary sx   Past Medical History:  Diagnosis Date   GERD (gastroesophageal reflux disease)    HLD (hyperlipidemia)    Hypertension associated with diabetes ([place]) 02/29/2016   Rosacea 01/28/2019   Type 1 diabetes ([place])     Patient's surgical history, family medical history, social history, medications and allergies were all reviewed in Epic    Current Outpatient Medications  Medication Sig Dispense Refill   Aspirin-Salicylamide-Caffeine (BC HEADACHE POWDER PO) Take by mouth.     atorvastatin (LIPITOR) 40 MG tablet TAKE 1 TABLET BY MOUTH EVERY DAY 90 tablet 0   Continuous Blood Gluc Receiver (FREESTYLE LIBRE 2 READER) DEVI Use to test blood sugar up to 6 times daily. DX; E11.9 1 each 0   Continuous Blood Gluc Sensor (FREESTYLE LIBRE 2 SENSOR) MISC Use to test blood sugar up to 6 times daily. DX: E11.9 2 each 11   enalapril (VASOTEC) 20 MG tablet Take 1 tablet (20 mg total) by mouth daily. Must be seen for any further refills 90 tablet 0   insulin aspart (NOVOLOG FLEXPEN) 100 UNIT/ML FlexPen Inject 30 Units into the skin 3 (three) times daily with meals. 90 mL 0   insulin degludec (TRESIBA FLEXTOUCH) 200 UNIT/ML FlexTouch Pen Inject 18 Units into the skin in the morning and at bedtime. 9 mL 0   Insulin Pen Needle (BD PEN NEEDLE MICRO U/F) 32G X 6 MM MISC Use with injection 6 times daily Dx  E10.9 600 each 3   meloxicam (MOBIC) 15 MG tablet Take 15 mg by mouth daily.     omeprazole (PRILOSEC) 20 MG capsule Take 1 capsule (20 mg total) by mouth daily. 90 capsule 1   OneTouch Delica Lancets 18H MISC      traMADol (ULTRAM) 50 MG tablet Take 1 tablet (50 mg total) by mouth every 12 (twelve) hours as needed. 60 tablet 2   No current facility-administered medications for this visit.    Physical Exam:     BP 134/80   Pulse 64   Ht 6' (1.829 m)   Wt 208 lb (94.3 kg)   BMI 28.21 kg/m   GENERAL:  Pleasant *** in NAD PSYCH: : Cooperative, normal affect EENT:  conjunctiva pink, mucous membranes moist, neck supple without masses CARDIAC:  RRR, no murmur heard, no peripheral edema PULM: Normal respiratory effort, lungs CTA bilaterally, no wheezing ABDOMEN:  +Carnett's sign with pinpoint TTP in the ***. Otherwise, nondistended, soft.  No peritoneal signs.  No obvious masses, no hepatomegaly,  normal bowel sounds SKIN:  turgor, no lesions seen Musculoskeletal:  Normal muscle tone, normal strength NEURO: Alert and oriented x 3, no focal neurologic deficits    IMPRESSION and PLAN:    #1.  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  All risks, benefits and alternative forms of therapy were described and informed consent was obtained.  -The patient was draped and the site prepped in the usual sterile fashion.  Site of pain was reconfirmed by manual palpation. -Lidocaine 1%.  Injected 5 cc into the site of pain, with resolution of pain upon repeat palpation.  This confirmed the diagnosis of Abdominal Wall Syndrome. -Kenalog 40 mg.  Injected directly into the site of pain for therapeutic intervention of the above confirmed Abdominal Wall Syndrome. -Band-Aid  applied -The patient was observed in the Gastroenterology Clinic for 15 minutes after the procedure. No complications were encountered and the patient tolerated the procedure well.  -I explained that pain may recur and approximately 20% of patients.  If the pain does recur, can follow-up with me for possible repeat injection in 4+ weeks.  Otherwise, to follow-up as needed.  ***Billing - CPT [phone] for injection of Lidocaine for initial diagnostic intent (ie, confirm the diagnosis)  - CPT 20552 for injection of Kenalog (injection of 1-2 trigger points) for the therapeutic portion  - [id number] x4 to cover the cost of Kenalog  #2.        Daryel November ,DO, FACG 11/07/2021, 10:40 AM

## 2021-11-13 ENCOUNTER — Other Ambulatory Visit: Payer: Self-pay | Admitting: Family Medicine

## 2021-11-13 DIAGNOSIS — E1159 Type 2 diabetes mellitus with other circulatory complications: Secondary | ICD-10-CM

## 2021-11-14 ENCOUNTER — Other Ambulatory Visit: Payer: Self-pay | Admitting: Family Medicine

## 2021-11-14 ENCOUNTER — Telehealth: Payer: Self-pay | Admitting: Family Medicine

## 2021-11-14 DIAGNOSIS — E1159 Type 2 diabetes mellitus with other circulatory complications: Secondary | ICD-10-CM

## 2021-11-14 NOTE — Telephone Encounter (Signed)
Pt aware that [facility] requested RF on 11/13/21 & this was sent, when RF request came through today we denied since it was just done, in our in basket screen we do not see that it is coming from different pharmacies. Pt ok with this and will come to [facility] to pick it up. I did remove [facility] from his profile but informed him that this will not prevent them from sending RF requests to [place].

## 2021-11-29 ENCOUNTER — Other Ambulatory Visit: Payer: Self-pay | Admitting: Family Medicine

## 2021-11-29 DIAGNOSIS — E1159 Type 2 diabetes mellitus with other circulatory complications: Secondary | ICD-10-CM

## 2021-12-07 ENCOUNTER — Encounter: Payer: Self-pay | Admitting: Gastroenterology

## 2021-12-08 ENCOUNTER — Other Ambulatory Visit: Payer: Self-pay

## 2021-12-08 DIAGNOSIS — R109 Unspecified abdominal pain: Secondary | ICD-10-CM

## 2021-12-11 ENCOUNTER — Encounter: Payer: Self-pay | Admitting: *Deleted

## 2021-12-11 ENCOUNTER — Encounter: Payer: Self-pay | Admitting: Nurse Practitioner

## 2021-12-11 ENCOUNTER — Ambulatory Visit (INDEPENDENT_AMBULATORY_CARE_PROVIDER_SITE_OTHER): Payer: Medicare Other | Admitting: Nurse Practitioner

## 2021-12-11 ENCOUNTER — Ambulatory Visit: Payer: Medicare Other

## 2021-12-11 VITALS — BP 121/76 | HR 98 | Temp 98.0°F | Resp 20 | Ht 72.0 in | Wt 200.0 lb

## 2021-12-11 DIAGNOSIS — R311 Benign essential microscopic hematuria: Secondary | ICD-10-CM

## 2021-12-11 DIAGNOSIS — R319 Hematuria, unspecified: Secondary | ICD-10-CM | POA: Diagnosis not present

## 2021-12-11 LAB — URINALYSIS, COMPLETE
Bilirubin, UA: NEGATIVE
Glucose, UA: NEGATIVE
Ketones, UA: NEGATIVE
Leukocytes,UA: NEGATIVE
Nitrite, UA: NEGATIVE
Specific Gravity, UA: 1.02 (ref 1.005–1.030)
Urobilinogen, Ur: 2 mg/dL — ABNORMAL HIGH (ref 0.2–1.0)
pH, UA: 6 (ref 5.0–7.5)

## 2021-12-11 LAB — MICROSCOPIC EXAMINATION: Renal Epithel, UA: NONE SEEN /hpf

## 2021-12-11 NOTE — Patient Instructions (Signed)

## 2021-12-11 NOTE — Progress Notes (Signed)
   Subjective:    Patient ID: Dustin Graham, male    DOB: 1951/04/09, 71 y.o.   MRN:  Chief Complaint: Urinary Tract Infection (Blood in urine )   Urinary Tract Infection  Associated symptoms include frequency, hematuria and urgency. Pertinent negatives include no chills or flank pain.    Patient comes in today c/o seeing blood in his urine. Occurred Saturday with 1 void. He said it looked like a lot of blood. He has seen none since. He has had some urgency and frequency. No dysuria. He has chronic back pain so he can not associate that with his urinary issues.    Review of Systems  Constitutional:  Positive for fatigue (slight more then usual). Negative for activity change, appetite change, chills and fever.  Respiratory: Negative.    Cardiovascular: Negative.   Gastrointestinal: Negative.   Genitourinary:  Positive for frequency, hematuria and urgency. Negative for dysuria, flank pain, scrotal swelling and testicular pain.       Objective:   Physical Exam Constitutional:      Appearance: Normal appearance. He is obese.  Cardiovascular:     Rate and Rhythm: Normal rate and regular rhythm.     Heart sounds: Normal heart sounds.  Pulmonary:     Effort: Pulmonary effort is normal.     Breath sounds: Normal breath sounds.  Abdominal:     General: Bowel sounds are normal.     Palpations: Abdomen is soft.     Tenderness: There is no abdominal tenderness. There is no right CVA tenderness, left CVA tenderness or guarding.  Skin:    General: Skin is warm.  Neurological:     General: No focal deficit present.     Mental Status: He is alert and oriented to person, place, and time.  Psychiatric:        Mood and Affect: Mood normal.        Behavior: Behavior normal.   BP 121/76   Pulse 98   Temp 98 F (36.7 C)   Resp 20   Ht 6' (1.829 m)   Wt 200 lb (90.7 kg)   SpO2 94%   BMI 27.12 kg/m          Assessment & Plan:  [name] in today with chief complaint of  Urinary Tract Infection (Blood in urine )   1. Hematuria, unspecified type - Urinalysis, Complete - Urine Culture  2. Benign essential microscopic hematuria Referral to uroogy    The above assessment and management plan was discussed with the patient. The patient verbalized understanding of and has agreed to the management plan. Patient is aware to call the clinic if symptoms persist or worsen. Patient is aware when to return to the clinic for a follow-up visit. Patient educated on when it is appropriate to go to the emergency department.   Mary-Margaret Hassell Done, FNP

## 2021-12-12 LAB — URINE CULTURE: Organism ID, Bacteria: NO GROWTH

## 2021-12-13 ENCOUNTER — Encounter: Payer: Self-pay | Admitting: Urology

## 2021-12-13 ENCOUNTER — Ambulatory Visit (INDEPENDENT_AMBULATORY_CARE_PROVIDER_SITE_OTHER): Payer: Medicare Other | Admitting: Urology

## 2021-12-13 VITALS — BP 154/82 | HR 121

## 2021-12-13 DIAGNOSIS — R310 Gross hematuria: Secondary | ICD-10-CM | POA: Diagnosis not present

## 2021-12-13 LAB — MICROSCOPIC EXAMINATION
Epithelial Cells (non renal): NONE SEEN /hpf (ref 0–10)
Renal Epithel, UA: NONE SEEN /hpf
WBC, UA: NONE SEEN /hpf (ref 0–5)

## 2021-12-13 LAB — URINALYSIS, ROUTINE W REFLEX MICROSCOPIC
Bilirubin, UA: NEGATIVE
Glucose, UA: NEGATIVE
Ketones, UA: NEGATIVE
Leukocytes,UA: NEGATIVE
Nitrite, UA: NEGATIVE
Specific Gravity, UA: 1.025 (ref 1.005–1.030)
Urobilinogen, Ur: 1 mg/dL (ref 0.2–1.0)
pH, UA: 5.5 (ref 5.0–7.5)

## 2021-12-13 NOTE — Patient Instructions (Signed)

## 2021-12-13 NOTE — Progress Notes (Signed)
12/13/2021 1:47 PM   [name] Nov 12, 1950 [id number]  Referring provider: Chevis Pretty, [address] [place],  [place] [address]  Gross hematuria   HPI: Dustin Graham is a 71yo here for evaluation of gross hematuria. Several weeks ago he had 1 episode of gross painless hematuria. NO prior history of microhematuria. No history of nephrolithiasis. No history of tobacco abuse. He has chronic right upper quadrant pain and underwent cholecystectomy which failed to improve his right upper quadrant/flank pain. IPSS 10 QOl 1 on no BPH therapy. No dysuria. NO history of UTI.    PMH: Past Medical History:  Diagnosis Date   GERD (gastroesophageal reflux disease)    HLD (hyperlipidemia)    Hypertension associated with diabetes ([place]) 02/29/2016   Rosacea 01/28/2019   Type 1 diabetes ([place])     Surgical History: Past Surgical History:  Procedure Laterality Date   broken nose     CHOLECYSTECTOMY     GALLBLADDER SURGERY     LUMBAR DISC SURGERY     fracture    Home Medications:  Allergies as of 12/13/2021       Reactions   Codeine Nausea And Vomiting        Medication List        Accurate as of December 13, 2021  1:47 PM. If you have any questions, ask your nurse or doctor.          atorvastatin 40 MG tablet Commonly known as: LIPITOR TAKE 1 TABLET BY MOUTH EVERY DAY   BC HEADACHE POWDER PO Take by mouth.   BD Pen Needle Micro U/F 32G X 6 MM Misc Generic drug: Insulin Pen Needle Use with injection 6 times daily Dx E10.9   enalapril 20 MG tablet Commonly known as: VASOTEC TAKE 1 TABLET (20 MG TOTAL) BY MOUTH DAILY. MUST BE SEEN FOR ANY FURTHER REFILLS   FreeStyle Libre 2 Reader Devi Use to test blood sugar up to 6 times daily. DX; E11.9   FreeStyle Libre 2 Sensor Misc Use to test blood sugar up to 6 times daily. DX: E11.9   meloxicam 15 MG tablet Commonly known as: MOBIC Take 15 mg by mouth daily.   NovoLOG FlexPen 100 UNIT/ML  FlexPen Generic drug: insulin aspart Inject 30 Units into the skin 3 (three) times daily with meals.   omeprazole 20 MG capsule Commonly known as: PRILOSEC Take 1 capsule (20 mg total) by mouth daily.   OneTouch Delica Lancets 10F Misc   traMADol 50 MG tablet Commonly known as: ULTRAM Take 1 tablet (50 mg total) by mouth every 12 (twelve) hours as needed.   Tyler Aas FlexTouch 200 UNIT/ML FlexTouch Pen Generic drug: insulin degludec INJECT 18 UNITS INTO THE SKIN IN THE MORNING AND AT BEDTIME.        Allergies:  Allergies  Allergen Reactions   Codeine Nausea And Vomiting    Family History: Family History  Problem Relation Age of Onset   Peptic Ulcer Father    Diabetes Paternal Aunt    Colon cancer Neg Hx    Esophageal cancer Neg Hx    Stomach cancer Neg Hx    Colon polyps Neg Hx     Social History:  reports that he has never smoked. He has never used smokeless tobacco. He reports current alcohol use of about 3.0 - 4.0 standard drinks of alcohol per week. He reports that he does not currently use drugs after having used the following drugs: Marijuana.  ROS: All other review  of systems were reviewed and are negative except what is noted above in HPI  Physical Exam: BP (!) 154/82   Pulse (!) 121   Constitutional:  Alert and oriented, No acute distress. HEENT: [place] AT, moist mucus membranes.  Trachea midline, no masses. Cardiovascular: No clubbing, cyanosis, or edema. Respiratory: Normal respiratory effort, no increased work of breathing. GI: Abdomen is soft, nontender, nondistended, no abdominal masses GU: No CVA tenderness.  Lymph: No cervical or inguinal lymphadenopathy. Skin: No rashes, bruises or suspicious lesions. Neurologic: Grossly intact, no focal deficits, moving all 4 extremities. Psychiatric: Normal mood and affect.  Laboratory Data: Lab Results  Component Value Date   WBC 7.2 09/21/2021   HGB 12.9 (L) 09/21/2021   HCT 37.3 (L) 09/21/2021   MCV 96  09/21/2021   PLT 171 09/21/2021    Lab Results  Component Value Date   CREATININE 0.90 09/21/2021    No results found for: "PSA"  No results found for: "TESTOSTERONE"  Lab Results  Component Value Date   HGBA1C 6.8 (H) 09/21/2021    Urinalysis    Component Value Date/Time   APPEARANCEUR Clear 12/11/2021 1402   GLUCOSEU Negative 12/11/2021 1402   BILIRUBINUR Negative 12/11/2021 1402   PROTEINUR Trace (A) 12/11/2021 1402   NITRITE Negative 12/11/2021 1402   LEUKOCYTESUR Negative 12/11/2021 1402    Lab Results  Component Value Date   LABMICR See below: 12/11/2021   WBCUA 0-5 12/11/2021   LABEPIT 0-10 12/11/2021   BACTERIA Few (A) 12/11/2021    Pertinent Imaging:  No results found for this or any previous visit.  No results found for this or any previous visit.  No results found for this or any previous visit.  No results found for this or any previous visit.  No results found for this or any previous visit.  No results found for this or any previous visit.  No results found for this or any previous visit.  No results found for this or any previous visit.   Assessment & Plan:    1. Gross hematuria BMP CT hematuria Office cystoscopy - Urinalysis, Routine w reflex microscopic   No follow-ups on file.  [name], MD  [facility] [place]

## 2021-12-14 LAB — BASIC METABOLIC PANEL
BUN/Creatinine Ratio: 10 (ref 10–24)
BUN: 8 mg/dL (ref 8–27)
CO2: 22 mmol/L (ref 20–29)
Calcium: 9.9 mg/dL (ref 8.6–10.2)
Chloride: 97 mmol/L (ref 96–106)
Creatinine, Ser: 0.8 mg/dL (ref 0.76–1.27)
Glucose: 230 mg/dL — ABNORMAL HIGH (ref 70–99)
Potassium: 4.9 mmol/L (ref 3.5–5.2)
Sodium: 136 mmol/L (ref 134–144)
eGFR: 95 mL/min/{1.73_m2} (ref >59)

## 2021-12-17 ENCOUNTER — Other Ambulatory Visit: Payer: Self-pay | Admitting: Family Medicine

## 2021-12-17 DIAGNOSIS — E782 Mixed hyperlipidemia: Secondary | ICD-10-CM

## 2021-12-17 DIAGNOSIS — E1159 Type 2 diabetes mellitus with other circulatory complications: Secondary | ICD-10-CM

## 2021-12-25 ENCOUNTER — Other Ambulatory Visit: Payer: Self-pay | Admitting: Family Medicine

## 2021-12-25 DIAGNOSIS — E1159 Type 2 diabetes mellitus with other circulatory complications: Secondary | ICD-10-CM

## 2021-12-29 ENCOUNTER — Other Ambulatory Visit: Payer: Self-pay | Admitting: Family Medicine

## 2021-12-29 DIAGNOSIS — K219 Gastro-esophageal reflux disease without esophagitis: Secondary | ICD-10-CM

## 2022-01-10 ENCOUNTER — Other Ambulatory Visit: Payer: Medicare Other | Admitting: Urology

## 2022-01-11 ENCOUNTER — Other Ambulatory Visit: Payer: Self-pay | Admitting: Family Medicine

## 2022-01-11 DIAGNOSIS — E1159 Type 2 diabetes mellitus with other circulatory complications: Secondary | ICD-10-CM

## 2022-01-15 ENCOUNTER — Encounter: Payer: Self-pay | Admitting: Urology

## 2022-01-15 NOTE — Telephone Encounter (Signed)
Patient aware and voiced understanding

## 2022-01-16 ENCOUNTER — Ambulatory Visit (HOSPITAL_COMMUNITY)
Admission: RE | Admit: 2022-01-16 | Discharge: 2022-01-16 | Disposition: A | Discharge status: Home / Self Care | Payer: Medicare Other | Source: Ambulatory Visit | Attending: Urology | Admitting: Urology

## 2022-01-16 DIAGNOSIS — R310 Gross hematuria: Secondary | ICD-10-CM | POA: Insufficient documentation

## 2022-01-16 MED ORDER — IOHEXOL 300 MG/ML  SOLN
100.0000 mL | Freq: Once | INTRAMUSCULAR | Status: AC | PRN
  Administered 2022-01-16: 100 mL via INTRAVENOUS

## 2022-01-17 ENCOUNTER — Telehealth: Payer: Self-pay

## 2022-01-17 NOTE — Telephone Encounter (Signed)
Patient called requesting an update on his CT he had done yesterday.  He states he did not want to wait until his upcoming apt on 08/30.  Please advise.

## 2022-01-19 ENCOUNTER — Telehealth: Payer: Self-pay

## 2022-01-19 ENCOUNTER — Ambulatory Visit: Payer: Medicare Other | Admitting: Family Medicine

## 2022-01-19 NOTE — Telephone Encounter (Signed)
Verbal from Dr. Alyson Ingles, okay to inform patient there is a spot in his bladder the size of a nickel that needs to be addressed.  He will look at it on 08/30 during his Cysto.  Patient informed and voiced understanding, he is aware a more detailed report will be given at his next apt.  Nothing further needed at this time.

## 2022-01-24 ENCOUNTER — Other Ambulatory Visit: Payer: Medicare Other | Admitting: Urology

## 2022-01-30 ENCOUNTER — Encounter: Payer: Self-pay | Admitting: Family Medicine

## 2022-01-30 ENCOUNTER — Ambulatory Visit (INDEPENDENT_AMBULATORY_CARE_PROVIDER_SITE_OTHER): Payer: Medicare Other | Admitting: Family Medicine

## 2022-01-30 VITALS — BP 117/70 | HR 95 | Temp 99.0°F | Resp 20 | Ht 72.0 in | Wt 201.0 lb

## 2022-01-30 DIAGNOSIS — E1065 Type 1 diabetes mellitus with hyperglycemia: Secondary | ICD-10-CM | POA: Diagnosis not present

## 2022-01-30 DIAGNOSIS — Z79899 Other long term (current) drug therapy: Secondary | ICD-10-CM

## 2022-01-30 DIAGNOSIS — N3289 Other specified disorders of bladder: Secondary | ICD-10-CM

## 2022-01-30 DIAGNOSIS — E1159 Type 2 diabetes mellitus with other circulatory complications: Secondary | ICD-10-CM | POA: Diagnosis not present

## 2022-01-30 DIAGNOSIS — G8929 Other chronic pain: Secondary | ICD-10-CM

## 2022-01-30 DIAGNOSIS — M2550 Pain in unspecified joint: Secondary | ICD-10-CM

## 2022-01-30 DIAGNOSIS — I152 Hypertension secondary to endocrine disorders: Secondary | ICD-10-CM

## 2022-01-30 DIAGNOSIS — E782 Mixed hyperlipidemia: Secondary | ICD-10-CM

## 2022-01-30 DIAGNOSIS — Z1211 Encounter for screening for malignant neoplasm of colon: Secondary | ICD-10-CM

## 2022-01-30 DIAGNOSIS — M545 Low back pain, unspecified: Secondary | ICD-10-CM

## 2022-01-30 LAB — BAYER DCA HB A1C WAIVED: HB A1C (BAYER DCA - WAIVED): 6.7 % — ABNORMAL HIGH (ref 4.8–5.6)

## 2022-01-30 MED ORDER — TRAMADOL HCL 50 MG PO TABS: 100.0000 mg | ORAL_TABLET | Freq: Two times a day (BID) | ORAL | 2 refills | Status: DC | PRN

## 2022-01-30 NOTE — Progress Notes (Signed)
Assessment & Plan:  1. Controlled type 1 diabetes mellitus with hyperglycemia (HCC) A1c 6.7 today. - Diabetes is at goal of A1c < 7. - Medications: continue current medications - Home glucose monitoring: continue using freestyle libre - Patient is currently taking a statin. Patient is taking an ACE-inhibitor/ARB.  - Instruction/counseling given: reminded to get eye exam  Diabetes Health Maintenance Due  Topic Date Due   OPHTHALMOLOGY EXAM  06/09/2020   HEMOGLOBIN A1C  03/23/2022   FOOT EXAM  09/22/2022    Lab Results  Component Value Date   LABMICR See below: 12/13/2021   LABMICR See below: 12/11/2021   - CBC with Differential/Platelet - CMP14+EGFR - Bayer DCA Hb A1c Waived  2. Hypertension associated with diabetes ([place]) Well controlled on current regimen.  - CBC with Differential/Platelet - CMP14+EGFR  3. Mixed hyperlipidemia Well controlled on current regimen.  - CBC with Differential/Platelet - CMP14+EGFR  4-6. Chronic bilateral low back pain without sciatica/Polyarthralgia/Controlled substance agreement signed Improved, but not well controlled. Increasing Tramadol from 50 mg to 100 mg twice daily. Controlled substance agreement in place. Urine drug screen as expected. PDMP reviewed with no concerning findings.  - traMADol (ULTRAM) 50 MG tablet; Take 2 tablets (100 mg total) by mouth every 12 (twelve) hours as needed.  Dispense: 180 tablet; Refill: 2  7. Bladder mass Reviewed results of CT scan and encouraged patient to keep appointment next week for cystoscopy with urology.   8. Screen for colon cancer - Cologuard   Return in about 3 months (around 05/01/2022) for follow-up of chronic medication conditions with T. Lilia Pro.  Hendricks Limes, MSN, APRN, FNP-C [facility]  Subjective:    Patient ID: Dustin Graham, male    DOB: [birth year], 71 y.o.   MRN: [id number]  Patient Care Team: Loman Brooklyn, FNP as PCP - General (Family  Medicine) Lavera Guise, [facility] (Pharmacist) Okey Regal, [place] (Optometry) Daryel November, MD as Consulting Physician (Gastroenterology)   Chief Complaint:  Chief Complaint  Patient presents with   Medical Management of Chronic Issues    HPI: Dustin Graham is a 71 y.o. male presenting on 01/30/2022 for Medical Management of Chronic Issues  Pain assessment: Cause of pain- He does have a history of a kyphoplasty at T12 due to a compression fracture.    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 Mild bilateral bony foraminal narrowing at those levels.    Previous left shoulder x-ray was negative. He is not interested in completing a MRI as he is not interested in further surgeries.   Pain location- back and left shoulder Pain on scale of 1-10- 7/10 without medication and 4-5/10 with medication Frequency- daily What increases pain- pain is constant What makes pain better- medication Effects on ADL- makes it slower Any change in general medical condition- no, but he does not feel the Tramadol works very well or lasts long enough for him.   Current opioids rx- Tramadol # meds rx- 60 Effectiveness of current meds- good Adverse reactions from pain meds- none Morphine equivalent- 10 MME/day  Pill count performed-No Last drug screen - never ( high risk q81m moderate risk q616mlow risk yearly ) Urine drug screen today- Yes Was the [place]- Yes  If yes were their any concerning findings? - No  Overdose risk: 230     10/19/2021    3:49 PM  Opioid Risk   Alcohol 3  Illegal Drugs 0  Rx Drugs 0  Alcohol 0  Illegal Drugs 0  Rx Drugs 0  Age between 71-71 years  0  History of Preadolescent Sexual Abuse 0  Psychological Disease 0  Depression 0  Opioid Risk Tool  Scoring 3  Opioid Risk Interpretation Low Risk   Pain contract signed on: 10/19/2021   Diabetes Mellitus Type I, Follow-up: Patient here for follow-up evaluation of Type 1 diabetes mellitus.  The initial diagnosis of diabetes was made at 71 years of age.  His clinical course has been stable. Insulin dosage review with Dustin Graham suggested compliance all of the time. Associated symptoms of hyperglycemia have been none.  Associated symptoms of hypoglycemia have been none. He is currently taking NovoLog 30 units 3 times daily with meals and Tresiba 22 units twice daily.  Compliance with blood glucose monitoring:  excellent - active with his freestyle libre 95% of the time . The patient does perform independently.   Hypertension: patient does not check BP at home.   New complaints: Patient would like to discuss CT results he had completed last month. He was supposed to follow-up with urology last week for a cystoscopy, but states they had to push out his appointment to next week.    Social history:  Relevant past medical, surgical, family and social history reviewed and updated as indicated. Interim medical history since our last visit reviewed.  Allergies and medications reviewed and updated.  DATA REVIEWED: CHART IN EPIC  ROS: Negative unless specifically indicated above in HPI.    Current Outpatient Medications:    atorvastatin (LIPITOR) 40 MG tablet, TAKE 1 TABLET BY MOUTH EVERY DAY, Disp: 90 tablet, Rfl: 0   Continuous Blood Gluc Receiver (FREESTYLE LIBRE 2 READER) DEVI, Use to test blood sugar up to 6 times daily. DX; E11.9, Disp: 1 each, Rfl: 0   Continuous Blood Gluc Sensor (FREESTYLE LIBRE 2 SENSOR) MISC, Use to test blood sugar up to 6 times daily. DX: E11.9, Disp: 2 each, Rfl: 11   enalapril (VASOTEC) 20 MG tablet, TAKE 1 TABLET (20 MG TOTAL) BY MOUTH DAILY. MUST BE SEEN FOR ANY FURTHER REFILLS, Disp: 90 tablet, Rfl: 0   insulin aspart (NOVOLOG FLEXPEN) 100 UNIT/ML FlexPen, Inject  30 Units into the skin 3 (three) times daily with meals., Disp: 90 mL, Rfl: 0   Insulin Pen Needle (BD PEN NEEDLE MICRO U/F) 32G X 6 MM MISC, Use with injection 6 times daily Dx E10.9, Disp: 600 each, Rfl: 3   omeprazole (PRILOSEC) 20 MG capsule, TAKE 1 CAPSULE BY MOUTH EVERY DAY, Disp: 90 capsule, Rfl: 0   OneTouch Delica Lancets 09G MISC, , Disp: , Rfl:    traMADol (ULTRAM) 50 MG tablet, Take 1 tablet (50 mg total) by mouth every 12 (twelve) hours as needed., Disp: 60 tablet, Rfl: 2   TRESIBA FLEXTOUCH 200 UNIT/ML FlexTouch Pen, INJECT 18 UNITS INTO THE SKIN IN THE MORNING AND AT BEDTIME., Disp: 9 mL, Rfl: 0   Aspirin-Salicylamide-Caffeine (BC HEADACHE POWDER PO), Take by mouth. (Patient not taking: Reported on 01/30/2022), Disp: , Rfl:    meloxicam (MOBIC) 15 MG tablet, Take 15 mg by mouth daily. (Patient not taking: Reported on 01/30/2022), Disp: , Rfl:    Allergies  Allergen Reactions   Codeine Nausea And Vomiting   Past Medical History:  Diagnosis Date   GERD (gastroesophageal reflux disease)  HLD (hyperlipidemia)    Hypertension associated with diabetes ([place]) 02/29/2016   Rosacea 01/28/2019   Type 1 diabetes ([place])     Past Surgical History:  Procedure Laterality Date   broken nose     CHOLECYSTECTOMY     GALLBLADDER SURGERY     LUMBAR DISC SURGERY     fracture    Social History   Socioeconomic History   Marital status: Divorced    Spouse name: Not on file   Number of children: 1   Years of education: 12   Highest education level: Some college, no degree  Occupational History    Employer: [employer]   Occupation: IT trainer    Comment: [employer]  Tobacco Use   Smoking status: Never   Smokeless tobacco: Never  Vaping Use   Vaping Use: Never used  Substance and Sexual Activity   Alcohol use: Yes    Alcohol/week: 3.0 - 4.0 standard drinks of alcohol    Types: 3 - 4 Standard drinks or equivalent per week    Comment: several beers and a  couple of shots per day   Drug use: Not Currently    Types: Marijuana   Sexual activity: Not on file  Other Topics Concern   Not on file  Social History Narrative   Lives alone   Still working full time at [employer]   Daughter lives 20 miles away   Social Determinants of Health   Financial Resource Strain: [place]  (09/05/2021)   Overall Financial Resource Strain (CARDIA)    Difficulty of Paying Living Expenses: Not hard at all  Food Insecurity: No Food Insecurity (09/05/2021)   Hunger Vital Sign    Worried About Running Out of Food in the Last Year: Never true    Amo in the Last Year: Never true  Transportation Needs: No Transportation Needs (09/05/2021)   PRAPARE - Hydrologist (Medical): No    Lack of Transportation (Non-Medical): No  Physical Activity: Insufficiently Active (09/05/2021)   Exercise Vital Sign    Days of Exercise per Week: 7 days    Minutes of Exercise per Session: 10 min  Stress: No Stress Concern Present (09/05/2021)   [place]    Feeling of Stress : Only a little  Social Connections: Moderately Isolated (09/05/2021)   Social Connection and Isolation Panel [NHANES]    Frequency of Communication with Friends and Family: More than three times a week    Frequency of Social Gatherings with Friends and Family: More than three times a week    Attends Religious Services: Never    Marine scientist or Organizations: Yes    Attends Music therapist: More than 4 times per year    Marital Status: Divorced  Intimate Partner Violence: Not At Risk (09/05/2021)   Humiliation, Afraid, Rape, and Kick questionnaire    Fear of Current or Ex-Partner: No    Emotionally Abused: No    Physically Abused: No    Sexually Abused: No        Objective:    BP 117/70   Pulse 95   Temp 99 F (37.2 C)   Resp 20   Ht 6' (1.829 m)   Wt 201 lb (91.2 kg)    SpO2 95%   BMI 27.26 kg/m   Wt Readings from Last 3 Encounters:  01/30/22 201 lb (91.2 kg)  12/11/21 200  lb (90.7 kg)  11/07/21 208 lb (94.3 kg)    Physical Exam Vitals reviewed.  Constitutional:      General: He is not in acute distress.    Appearance: Normal appearance. He is overweight. He is not ill-appearing, toxic-appearing or diaphoretic.  HENT:     Head: Normocephalic and atraumatic.  Eyes:     General: No scleral icterus.       Right eye: No discharge.        Left eye: No discharge.     Conjunctiva/sclera: Conjunctivae normal.  Cardiovascular:     Rate and Rhythm: Normal rate.  Pulmonary:     Effort: Pulmonary effort is normal. No respiratory distress.  Musculoskeletal:        General: Normal range of motion.     Cervical back: Normal range of motion.  Skin:    General: Skin is warm and dry.  Neurological:     Mental Status: He is alert and oriented to person, place, and time. Mental status is at baseline.  Psychiatric:        Mood and Affect: Mood normal.        Behavior: Behavior normal.        Thought Content: Thought content normal.        Judgment: Judgment normal.     Lab Results  Component Value Date   TSH 1.330 09/21/2021   Lab Results  Component Value Date   WBC 7.2 09/21/2021   HGB 12.9 (L) 09/21/2021   HCT 37.3 (L) 09/21/2021   MCV 96 09/21/2021   PLT 171 09/21/2021   Lab Results  Component Value Date   NA 136 12/13/2021   K 4.9 12/13/2021   CO2 22 12/13/2021   GLUCOSE 230 (H) 12/13/2021   BUN 8 12/13/2021   CREATININE 0.80 12/13/2021   BILITOT 0.8 09/21/2021   ALKPHOS 93 09/21/2021   AST 28 09/21/2021   ALT 33 09/21/2021   PROT 6.6 09/21/2021   ALBUMIN 4.1 09/21/2021   CALCIUM 9.9 12/13/2021   EGFR 95 12/13/2021   GFR 92.74 08/16/2021   Lab Results  Component Value Date   CHOL 177 09/21/2021   Lab Results  Component Value Date   HDL 79 09/21/2021   Lab Results  Component Value Date   LDLCALC 76 09/21/2021   Lab  Results  Component Value Date   TRIG 126 09/21/2021   Lab Results  Component Value Date   CHOLHDL 2.2 09/21/2021   Lab Results  Component Value Date   HGBA1C 6.8 (H) 09/21/2021

## 2022-01-30 NOTE — Patient Instructions (Signed)
Schedule your eye exam. ?

## 2022-01-31 LAB — CBC WITH DIFFERENTIAL/PLATELET
Basophils Absolute: 0.1 10*3/uL (ref 0.0–0.2)
Basos: 1 %
EOS (ABSOLUTE): 0.3 10*3/uL (ref 0.0–0.4)
Eos: 4 %
Hematocrit: 42.5 % (ref 37.5–51.0)
Hemoglobin: 14.7 g/dL (ref 13.0–17.7)
Immature Grans (Abs): 0 10*3/uL (ref 0.0–0.1)
Immature Granulocytes: 0 %
Lymphocytes Absolute: 2.3 10*3/uL (ref 0.7–3.1)
Lymphs: 28 %
MCH: 32.2 pg (ref 26.6–33.0)
MCHC: 34.6 g/dL (ref 31.5–35.7)
MCV: 93 fL (ref 79–97)
Monocytes Absolute: 0.7 10*3/uL (ref 0.1–0.9)
Monocytes: 8 %
Neutrophils Absolute: 4.8 10*3/uL (ref 1.4–7.0)
Neutrophils: 59 %
Platelets: 187 10*3/uL (ref 150–450)
RBC: 4.57 x10E6/uL (ref 4.14–5.80)
RDW: 11.1 % — ABNORMAL LOW (ref 11.6–15.4)
WBC: 8.2 10*3/uL (ref 3.4–10.8)

## 2022-01-31 LAB — CMP14+EGFR
ALT: 27 IU/L (ref 0–44)
AST: 30 IU/L (ref 0–40)
Albumin/Globulin Ratio: 1.5 (ref 1.2–2.2)
Albumin: 4.1 g/dL (ref 3.9–4.9)
Alkaline Phosphatase: 106 IU/L (ref 44–121)
BUN/Creatinine Ratio: 11 (ref 10–24)
BUN: 8 mg/dL (ref 8–27)
Bilirubin Total: 1.3 mg/dL — ABNORMAL HIGH (ref 0.0–1.2)
CO2: 24 mmol/L (ref 20–29)
Calcium: 9.7 mg/dL (ref 8.6–10.2)
Chloride: 101 mmol/L (ref 96–106)
Creatinine, Ser: 0.71 mg/dL — ABNORMAL LOW (ref 0.76–1.27)
Globulin, Total: 2.8 g/dL (ref 1.5–4.5)
Glucose: 127 mg/dL — ABNORMAL HIGH (ref 70–99)
Potassium: 4.8 mmol/L (ref 3.5–5.2)
Sodium: 140 mmol/L (ref 134–144)
Total Protein: 6.9 g/dL (ref 6.0–8.5)
eGFR: 99 mL/min/{1.73_m2} (ref >59)

## 2022-02-05 ENCOUNTER — Ambulatory Visit (INDEPENDENT_AMBULATORY_CARE_PROVIDER_SITE_OTHER): Payer: Medicare Other | Admitting: Urology

## 2022-02-05 VITALS — BP 119/77 | HR 116

## 2022-02-05 DIAGNOSIS — R310 Gross hematuria: Secondary | ICD-10-CM | POA: Diagnosis not present

## 2022-02-05 DIAGNOSIS — N3289 Other specified disorders of bladder: Secondary | ICD-10-CM | POA: Diagnosis not present

## 2022-02-05 LAB — URINALYSIS, ROUTINE W REFLEX MICROSCOPIC
Bilirubin, UA: NEGATIVE
Glucose, UA: NEGATIVE
Ketones, UA: NEGATIVE
Leukocytes,UA: NEGATIVE
Nitrite, UA: NEGATIVE
Protein,UA: NEGATIVE
RBC, UA: NEGATIVE
Specific Gravity, UA: 1.02 (ref 1.005–1.030)
Urobilinogen, Ur: 0.2 mg/dL (ref 0.2–1.0)
pH, UA: 5 (ref 5.0–7.5)

## 2022-02-05 MED ORDER — CIPROFLOXACIN HCL 500 MG PO TABS: 500.0000 mg | ORAL_TABLET | Freq: Once | ORAL | Status: DC

## 2022-02-05 NOTE — Progress Notes (Signed)
   02/05/22  CC: bladder mass   HPI: Mr Scholz is a 71yo here for cystoscopy for a bladder mass seen on cystoscopy Blood pressure 119/77, pulse (!) 116. NED. A&Ox3.   No respiratory distress   Abd soft, NT, ND Normal phallus with bilateral descended testicles  Cystoscopy Procedure Note  Patient identification was confirmed, informed consent was obtained, and patient was prepped using Betadine solution.  Lidocaine jelly was administered per urethral meatus.     Pre-Procedure: - Inspection reveals a normal caliber ureteral meatus.  Procedure: The flexible cystoscope was introduced without difficulty - No urethral strictures/lesions are present. - Enlarged prostate  - Normal bladder neck - Bilateral ureteral orifices identified - 3cm right laterla wall papillary tumor - No bladder stones - No trabeculation  Retroflexion shows no intravesical prostatic protrusion   Post-Procedure: - Patient tolerated the procedure well  Assessment/ Plan: Schedule for bladder tumor resection. Risks/benefits alternatives to transurethral resection of a bladder tumor was explained to the patient  No follow-ups on file.  [name], MD

## 2022-02-05 NOTE — H&P (View-Only) (Signed)
   02/05/22  CC: bladder mass   HPI: Dustin Graham is a 71yo here for cystoscopy for a bladder mass seen on cystoscopy Blood pressure 119/77, pulse (!) 116. NED. A&Ox3.   No respiratory distress   Abd soft, NT, ND Normal phallus with bilateral descended testicles  Cystoscopy Procedure Note  Patient identification was confirmed, informed consent was obtained, and patient was prepped using Betadine solution.  Lidocaine jelly was administered per urethral meatus.     Pre-Procedure: - Inspection reveals a normal caliber ureteral meatus.  Procedure: The flexible cystoscope was introduced without difficulty - No urethral strictures/lesions are present. - Enlarged prostate  - Normal bladder neck - Bilateral ureteral orifices identified - 3cm right laterla wall papillary tumor - No bladder stones - No trabeculation  Retroflexion shows no intravesical prostatic protrusion   Post-Procedure: - Patient tolerated the procedure well  Assessment/ Plan: Schedule for bladder tumor resection. Risks/benefits alternatives to transurethral resection of a bladder tumor was explained to the patient  No follow-ups on file.  [name], MD

## 2022-02-05 NOTE — Progress Notes (Signed)
I spoke with Dustin Graham. We have discussed possible surgery dates and 02/15/2022 was agreed upon by all parties. Patient given information about surgery date, what to expect pre-operatively and post operatively.    We discussed that a pre-op nurse will be calling to set up the pre-op visit that will take place prior to surgery. Informed patient that our office will communicate any additional care to be provided after surgery.    Patients questions or concerns were discussed during our call. Advised to call our office should there be any additional information, questions or concerns that arise. Patient verbalized understanding.

## 2022-02-08 NOTE — Patient Instructions (Signed)
Dustin Graham  02/08/2022     '@[email or web address]'$ @   Your procedure is scheduled on  02/15/2022.   Report to [facility] at  0930  A.M.   Call this number if you have problems the morning of surgery:  [phone]   Remember:  Do not eat or drink after midnight.        Take 1/2 of your night time insulin the night before your procedure.     Take these medicines the morning of surgery with A SIP OF WATER                                        prilosec, ultram (if needed).     Do not wear jewelry, make-up or nail polish.  Do not wear lotions, powders, or perfumes, or deodorant.  Do not shave 48 hours prior to surgery.  Men may shave face and neck.  Do not bring valuables to the hospital.  [facility] is not responsible for any belongings or valuables.  Contacts, dentures or bridgework may not be worn into surgery.  Leave your suitcase in the car.  After surgery it may be brought to your room.  For patients admitted to the hospital, discharge time will be determined by your treatment team.  Patients discharged the day of surgery will not be allowed to drive home and must have someone with them for 24 hours.    Special instructions:   DO NOT smoke tobacco or vape for 24 hours before your procedure.  Please read over the following fact sheets that you were given. Coughing and Deep Breathing, Surgical Site Infection Prevention, Anesthesia Post-op Instructions, and Care and Recovery After Surgery      Transurethral Resection of Bladder Tumor, Care After The following information offers guidance on how to care for yourself after your procedure. Your health care provider may also give you more specific instructions. If you have problems or questions, contact your health care provider. What can I expect after the procedure? After the procedure, it is common to have: A small amount of blood or small blood clots in your urine for up to 2 weeks. Soreness or mild pain  from your catheter. After your catheter is removed, you may have mild soreness, especially when urinating. A need to urinate often. Pain in your lower abdomen. Follow these instructions at home: Medicines  Take over-the-counter and prescription medicines only as told by your health care provider. If you were prescribed an antibiotic medicine, take it as told by your health care provider. Do not stop taking the antibiotic even if you start to feel better. Ask your health care provider if the medicine prescribed to you: Requires you to avoid driving or using machinery. Can cause constipation. You may need to take these actions to prevent or treat constipation: Drink enough fluid to keep your urine pale yellow. Take over-the-counter or prescription medicines. Eat foods that are high in fiber, such as beans, whole grains, and fresh fruits and vegetables. Limit foods that are high in fat and processed sugars, such as fried or sweet foods. Activity  If you were given a sedative during the procedure, it can affect you for several hours. Do not drive or operate machinery until your health care provider says that it is safe. Rest as told by your health care provider. Avoid sitting for a long time  without moving. Get up to take short walks every 1-2 hours. This is important to improve blood flow and breathing. Ask for help if you feel weak or unsteady. Do not lift anything that is heavier than 10 lb (4.5 kg), or the limit that you are told, until your health care provider says that it is safe. Avoid intense physical activity for as long as told by your health care provider. Do not have sex until your health care provider approves. Return to your normal activities as told by your health care provider. Ask your health care provider what activities are safe for you. General instructions If you have a catheter, follow instructions from your health care provider about caring for your catheter and your  drainage bag. Do not drink alcohol for as long as told by your health care provider. This is especially important if you are taking prescription pain medicines. Do not use any products that contain nicotine or tobacco. These products include cigarettes, chewing tobacco, and vaping devices, such as e-cigarettes. If you need help quitting, ask your health care provider. Wear compression stockings as told by your health care provider. These stockings help to prevent blood clots and reduce swelling in your legs. Keep all follow-up visits. This is important. You will need to be followed closely with regular checks of your bladder and urethra (cystoscopies) to make sure that the cancer does not come back. Contact a health care provider if: You have blood in your urine for more than 2 weeks. You become constipated. Signs of constipation may include: Having fewer than three bowel movements in a week. Difficulty having a bowel movement. Stools that are dry, hard, or larger than normal. You have a urinary catheter in place, and you have: Spasms or pain. Problems with your catheter or your catheter is blocked. Your catheter has been taken out but you are unable to urinate. You have signs of infection, such as: Fever or chills. Cloudy or bad-smelling urine. Get help right away if: You have severe abdominal pain that gets worse or does not improve with medicine. You have a lot of large blood clots in your urine. You develop swelling or pain in your leg. You have difficulty breathing. These symptoms may be an emergency. Get help right away. Call 911. Do not wait to see if the symptoms will go away. Do not drive yourself to the hospital. Summary After your procedure, it is common to have a small amount of blood or small blood clots in your urine, soreness or mild pain from your catheter, and pain in your lower abdomen. Take over-the-counter and prescription medicines only as told by your health care  provider. Rest as told by your health care provider. Follow your health care provider's instructions about returning to normal activities. Ask what activities are safe for you. If you have a catheter, follow instructions from your health care provider about caring for your catheter and your drainage bag. This information is not intended to replace advice given to you by your health care provider. Make sure you discuss any questions you have with your health care provider. Document Revised: 05/19/2021 Document Reviewed: 05/19/2021 Elsevier Patient Education  [place] Anesthesia, Adult, Care After The following information offers guidance on how to care for yourself after your procedure. Your health care provider may also give you more specific instructions. If you have problems or questions, contact your health care provider. What can I expect after the procedure? After the procedure, it is  common for people to: Have pain or discomfort at the IV site. Have nausea or vomiting. Have a sore throat or hoarseness. Have trouble concentrating. Feel cold or chills. Feel weak, sleepy, or tired (fatigue). Have soreness and body aches. These can affect parts of the body that were not involved in surgery. Follow these instructions at home: For the time period you were told by your health care provider:  Rest. Do not participate in activities where you could fall or become injured. Do not drive or use machinery. Do not drink alcohol. Do not take sleeping pills or medicines that cause drowsiness. Do not make important decisions or sign legal documents. Do not take care of children on your own. General instructions Drink enough fluid to keep your urine pale yellow. If you have sleep apnea, surgery and certain medicines can increase your risk for breathing problems. Follow instructions from your health care provider about wearing your sleep device: Anytime you are sleeping, including  during daytime naps. While taking prescription pain medicines, sleeping medicines, or medicines that make you drowsy. Return to your normal activities as told by your health care provider. Ask your health care provider what activities are safe for you. Take over-the-counter and prescription medicines only as told by your health care provider. Do not use any products that contain nicotine or tobacco. These products include cigarettes, chewing tobacco, and vaping devices, such as e-cigarettes. These can delay incision healing after surgery. If you need help quitting, ask your health care provider. Contact a health care provider if: You have nausea or vomiting that does not get better with medicine. You vomit every time you eat or drink. You have pain that does not get better with medicine. You cannot urinate or have bloody urine. You develop a skin rash. You have a fever. Get help right away if: You have trouble breathing. You have chest pain. You vomit blood. These symptoms may be an emergency. Get help right away. Call 911. Do not wait to see if the symptoms will go away. Do not drive yourself to the hospital. Summary After the procedure, it is common to have a sore throat, hoarseness, nausea, vomiting, or to feel weak, sleepy, or fatigue. For the time period you were told by your health care provider, do not drive or use machinery. Get help right away if you have difficulty breathing, have chest pain, or vomit blood. These symptoms may be an emergency. This information is not intended to replace advice given to you by your health care provider. Make sure you discuss any questions you have with your health care provider. Document Revised: 08/11/2021 Document Reviewed: 08/11/2021 Elsevier Patient Education  [place].

## 2022-02-12 ENCOUNTER — Encounter (HOSPITAL_COMMUNITY): Payer: Self-pay

## 2022-02-12 ENCOUNTER — Encounter (HOSPITAL_COMMUNITY)
Admission: RE | Admit: 2022-02-12 | Discharge: 2022-02-12 | Disposition: A | Discharge status: Home / Self Care | Payer: Medicare Other | Source: Ambulatory Visit | Attending: Urology | Admitting: Urology

## 2022-02-12 VITALS — BP 151/81 | HR 88 | Temp 98.4°F | Resp 18 | Ht 72.0 in | Wt 201.0 lb

## 2022-02-12 DIAGNOSIS — E1159 Type 2 diabetes mellitus with other circulatory complications: Secondary | ICD-10-CM | POA: Insufficient documentation

## 2022-02-12 DIAGNOSIS — I152 Hypertension secondary to endocrine disorders: Secondary | ICD-10-CM | POA: Diagnosis not present

## 2022-02-12 DIAGNOSIS — Z01810 Encounter for preprocedural cardiovascular examination: Secondary | ICD-10-CM | POA: Diagnosis present

## 2022-02-12 HISTORY — DX: Unspecified osteoarthritis, unspecified site: M19.90

## 2022-02-13 ENCOUNTER — Encounter: Payer: Self-pay | Admitting: Urology

## 2022-02-13 NOTE — Patient Instructions (Signed)
Transurethral Resection of Bladder Tumor  Transurethral resection of a bladder tumor is the removal (resection) of cancerous tissue (tumor) from the inside wall of the bladder. The bladder is the organ that holds urine. The tumor is removed through the tube that carries urine out of the body (urethra). In a transurethral resection, a thin telescope with a light, a tiny camera, and an electric cutting edge (resectoscope) is passed through the urethra. In men, the opening of the urethra is at the end of the penis. In women, it is just above the opening of the vagina. Tell a health care provider about: Any allergies you have. All medicines you are taking, including vitamins, herbs, eye drops, creams, and over-the-counter medicines. Any problems you or family members have had with anesthetic medicines. Any bleeding problems you have. Any surgeries you have had. Any medical conditions you have, including recent urinary tract infections. Whether you are pregnant or may be pregnant. What are the risks? Generally, this is a safe procedure. However, problems may occur, including: Infection. Bleeding. Allergic reactions to medicines. Damage to nearby structures or organs. Difficulty urinating from blockage of the urethra or not being able to urinate (urinary retention). Deep vein thrombosis. This is a blood clot that can develop in your leg. Recurring cancer. What happens before the procedure? When to stop eating and drinking Follow instructions from your health care provider about what you may eat and drink before your procedure. These may include: 8 hours before your procedure Stop eating most foods. Do not eat meat, fried foods, or fatty foods. Eat only light foods, such as toast or crackers. All liquids are okay except energy drinks and alcohol. 6 hours before your procedure Stop eating. Drink only clear liquids, such as water, clear fruit juice, black coffee, plain tea, and sports  drinks. Do not drink energy drinks or alcohol. 2 hours before your procedure Stop drinking all liquids. You may be allowed to take medicines with small sips of water. Medicines Ask your health care provider about: Changing or stopping your regular medicines. This is especially important if you are taking diabetes medicines or blood thinners. Taking medicines such as aspirin and ibuprofen. These medicines can thin your blood. Do not take these medicines unless your health care provider tells you to take them. Taking over-the-counter medicines, vitamins, herbs, and supplements. General instructions If you will be going home right after the procedure, plan to have a responsible adult: Take you home from the hospital or clinic. You will not be allowed to drive. Care for you for the time you are told. Ask your health care provider what steps will be taken to help prevent infection. These steps may include: Washing skin with a germ-killing soap. Taking antibiotic medicine. Do not use any products that contain nicotine or tobacco for at least 4 weeks before the procedure. These products include cigarettes, chewing tobacco, and vaping devices, such as e-cigarettes. If you need help quitting, ask your health care provider. What happens during the procedure? An IV will be inserted into one of your veins. You will be given one or more of the following: A medicine to help you relax (sedative). A medicine that is injected into your spine to numb the area below and slightly above the injection site (spinal anesthetic). A medicine that is injected into an area of your body to numb everything below the injection site (regional anesthetic). A medicine to make you fall asleep (general anesthetic). Your legs will be placed in foot rests (  stirrups) to open your legs and bend your knees. The resectoscope will be passed through your urethra and into your bladder. The part of your bladder with the tumor will be  resected by the cutting edge of the resectoscope. Fluid will be passed to rinse out the cut tissues (irrigation). The resectoscope will then be taken out. A small, thin tube (catheter) will be passed through your urethra and into your bladder. The catheter will drain urine into a bag outside of your body. The procedure may vary among health care providers and hospitals. What happens after the procedure? Your blood pressure, heart rate, breathing rate, and blood oxygen level will be monitored until you leave the hospital or clinic. You may continue to receive fluids and medicines through an IV. You will be given pain medicine to relieve pain. You will have a catheter to drain your urine. The amount of urine will be measured. If you have blood in your urine, your bladder may be rinsed out by passing fluid through your catheter. You will be encouraged to walk as soon as you can. You may have to wear compression stockings. These stockings help to prevent blood clots and reduce swelling in your legs. If you were given a sedative during the procedure, it can affect you for several hours. Do not drive or operate machinery until your health care provider says that it is safe. Summary Transurethral resection of a bladder tumor is the removal (resection) of a cancerous growth (tumor) on the inside wall of the bladder. To do this procedure, your health care provider uses a thin telescope with a light, a tiny camera, and an electric cutting edge (resectoscope) that is guided to your bladder through your urethra. The part of your bladder that is affected by the tumor will be resected by the cutting edge of the resectoscope. A catheter will be passed through your urethra and into your bladder. The catheter will drain urine into a bag outside of your body. If you will be going home right after the procedure, plan to have a responsible adult take you home from the hospital or clinic. You will not be allowed to  drive. This information is not intended to replace advice given to you by your health care provider. Make sure you discuss any questions you have with your health care provider. Document Revised: 05/19/2021 Document Reviewed: 05/19/2021 Elsevier Patient Education  2023 Elsevier Inc.  

## 2022-02-15 ENCOUNTER — Other Ambulatory Visit: Payer: Self-pay

## 2022-02-15 ENCOUNTER — Ambulatory Visit (HOSPITAL_BASED_OUTPATIENT_CLINIC_OR_DEPARTMENT_OTHER): Payer: Medicare Other | Admitting: Certified Registered"

## 2022-02-15 ENCOUNTER — Encounter (HOSPITAL_COMMUNITY): Payer: Self-pay | Admitting: Urology

## 2022-02-15 ENCOUNTER — Ambulatory Visit (HOSPITAL_COMMUNITY)
Admission: RE | Admit: 2022-02-15 | Discharge: 2022-02-15 | Disposition: A | Discharge status: Home / Self Care | Payer: Medicare Other | Attending: Urology | Admitting: Urology

## 2022-02-15 ENCOUNTER — Ambulatory Visit (HOSPITAL_COMMUNITY): Payer: Medicare Other | Admitting: Certified Registered"

## 2022-02-15 ENCOUNTER — Ambulatory Visit (HOSPITAL_COMMUNITY): Payer: Medicare Other

## 2022-02-15 ENCOUNTER — Encounter (HOSPITAL_COMMUNITY)
Admission: RE | Disposition: A | Discharge status: Home / Self Care | Payer: Self-pay | Source: Home / Self Care | Attending: Urology

## 2022-02-15 DIAGNOSIS — E119 Type 2 diabetes mellitus without complications: Secondary | ICD-10-CM

## 2022-02-15 DIAGNOSIS — D494 Neoplasm of unspecified behavior of bladder: Secondary | ICD-10-CM

## 2022-02-15 DIAGNOSIS — C679 Malignant neoplasm of bladder, unspecified: Secondary | ICD-10-CM | POA: Insufficient documentation

## 2022-02-15 DIAGNOSIS — Z794 Long term (current) use of insulin: Secondary | ICD-10-CM | POA: Diagnosis not present

## 2022-02-15 DIAGNOSIS — I10 Essential (primary) hypertension: Secondary | ICD-10-CM | POA: Diagnosis not present

## 2022-02-15 HISTORY — PX: TRANSURETHRAL RESECTION OF BLADDER TUMOR: SHX2575

## 2022-02-15 HISTORY — PX: CYSTOSCOPY W/ RETROGRADES: SHX1426

## 2022-02-15 LAB — GLUCOSE, CAPILLARY: Glucose-Capillary: 193 mg/dL — ABNORMAL HIGH (ref 70–99)

## 2022-02-15 SURGERY — CYSTOSCOPY, WITH RETROGRADE PYELOGRAM
Anesthesia: General | Site: Ureter

## 2022-02-15 MED ORDER — PHENYLEPHRINE 80 MCG/ML (10ML) SYRINGE FOR IV PUSH (FOR BLOOD PRESSURE SUPPORT)
PREFILLED_SYRINGE | INTRAVENOUS | Status: DC | PRN
  Administered 2022-02-15: 240 ug via INTRAVENOUS
  Administered 2022-02-15 (×3): 160 ug via INTRAVENOUS
  Administered 2022-02-15 (×2): 240 ug via INTRAVENOUS
  Administered 2022-02-15 (×2): 160 ug via INTRAVENOUS

## 2022-02-15 MED ORDER — ONDANSETRON HCL 4 MG/2ML IJ SOLN: 4.0000 mg | Freq: Once | INTRAMUSCULAR | Status: DC | PRN

## 2022-02-15 MED ORDER — EPHEDRINE SULFATE-NACL 50-0.9 MG/10ML-% IV SOSY
PREFILLED_SYRINGE | INTRAVENOUS | Status: DC | PRN
  Administered 2022-02-15: 5 mg via INTRAVENOUS

## 2022-02-15 MED ORDER — FENTANYL CITRATE PF 50 MCG/ML IJ SOSY
PREFILLED_SYRINGE | INTRAMUSCULAR | Status: AC
  Filled 2022-02-15: qty 1

## 2022-02-15 MED ORDER — CEFAZOLIN SODIUM-DEXTROSE 2-4 GM/100ML-% IV SOLN
2.0000 g | INTRAVENOUS | Status: AC
  Administered 2022-02-15: 2 g via INTRAVENOUS

## 2022-02-15 MED ORDER — ROCURONIUM BROMIDE 10 MG/ML (PF) SYRINGE
PREFILLED_SYRINGE | INTRAVENOUS | Status: AC
  Filled 2022-02-15: qty 10

## 2022-02-15 MED ORDER — ROCURONIUM BROMIDE 100 MG/10ML IV SOLN
INTRAVENOUS | Status: DC | PRN
  Administered 2022-02-15: 60 mg via INTRAVENOUS

## 2022-02-15 MED ORDER — LACTATED RINGERS IV SOLN: INTRAVENOUS | Status: DC

## 2022-02-15 MED ORDER — ONDANSETRON HCL 4 MG/2ML IJ SOLN
INTRAMUSCULAR | Status: DC | PRN
  Administered 2022-02-15: 4 mg via INTRAVENOUS

## 2022-02-15 MED ORDER — SODIUM CHLORIDE 0.9 % IR SOLN
Status: DC | PRN
  Administered 2022-02-15: 3000 mL

## 2022-02-15 MED ORDER — STERILE WATER FOR IRRIGATION IR SOLN
Status: DC | PRN
  Administered 2022-02-15: 500 mL

## 2022-02-15 MED ORDER — ONDANSETRON HCL 4 MG/2ML IJ SOLN
INTRAMUSCULAR | Status: AC
  Filled 2022-02-15: qty 2

## 2022-02-15 MED ORDER — OXYCODONE HCL 5 MG PO TABS
ORAL_TABLET | ORAL | Status: AC
  Filled 2022-02-15: qty 1

## 2022-02-15 MED ORDER — OXYCODONE HCL 5 MG/5ML PO SOLN: 5.0000 mg | Freq: Once | ORAL | Status: AC | PRN

## 2022-02-15 MED ORDER — FENTANYL CITRATE (PF) 100 MCG/2ML IJ SOLN
INTRAMUSCULAR | Status: AC
  Filled 2022-02-15: qty 2

## 2022-02-15 MED ORDER — CHLORHEXIDINE GLUCONATE 0.12 % MT SOLN: 15.0000 mL | Freq: Once | OROMUCOSAL | Status: DC

## 2022-02-15 MED ORDER — DEXAMETHASONE SODIUM PHOSPHATE 10 MG/ML IJ SOLN
INTRAMUSCULAR | Status: DC | PRN
  Administered 2022-02-15: 10 mg via INTRAVENOUS

## 2022-02-15 MED ORDER — OXYCODONE HCL 5 MG PO TABS
5.0000 mg | ORAL_TABLET | Freq: Once | ORAL | Status: AC | PRN
  Administered 2022-02-15: 5 mg via ORAL

## 2022-02-15 MED ORDER — LIDOCAINE HCL (PF) 2 % IJ SOLN
INTRAMUSCULAR | Status: AC
  Filled 2022-02-15: qty 5

## 2022-02-15 MED ORDER — PROPOFOL 10 MG/ML IV BOLUS
INTRAVENOUS | Status: DC | PRN
  Administered 2022-02-15: 200 mg via INTRAVENOUS

## 2022-02-15 MED ORDER — HYDROCODONE-ACETAMINOPHEN 5-325 MG PO TABS: 1.0000 | ORAL_TABLET | Freq: Four times a day (QID) | ORAL | 0 refills | Status: DC | PRN

## 2022-02-15 MED ORDER — SUGAMMADEX SODIUM 500 MG/5ML IV SOLN
INTRAVENOUS | Status: AC
  Filled 2022-02-15: qty 5

## 2022-02-15 MED ORDER — LACTATED RINGERS IV SOLN: INTRAVENOUS | Status: DC | PRN

## 2022-02-15 MED ORDER — DEXAMETHASONE SODIUM PHOSPHATE 10 MG/ML IJ SOLN
INTRAMUSCULAR | Status: AC
  Filled 2022-02-15: qty 1

## 2022-02-15 MED ORDER — SUCCINYLCHOLINE CHLORIDE 200 MG/10ML IV SOSY
PREFILLED_SYRINGE | INTRAVENOUS | Status: AC
  Filled 2022-02-15: qty 20

## 2022-02-15 MED ORDER — ORAL CARE MOUTH RINSE: 15.0000 mL | Freq: Once | OROMUCOSAL | Status: DC

## 2022-02-15 MED ORDER — FENTANYL CITRATE (PF) 100 MCG/2ML IJ SOLN
INTRAMUSCULAR | Status: DC | PRN
  Administered 2022-02-15 (×2): 50 ug via INTRAVENOUS

## 2022-02-15 MED ORDER — CEFAZOLIN SODIUM-DEXTROSE 2-4 GM/100ML-% IV SOLN
INTRAVENOUS | Status: AC
  Filled 2022-02-15: qty 100

## 2022-02-15 MED ORDER — LIDOCAINE 2% (20 MG/ML) 5 ML SYRINGE
INTRAMUSCULAR | Status: DC | PRN
  Administered 2022-02-15: 100 mg via INTRAVENOUS

## 2022-02-15 MED ORDER — DIATRIZOATE MEGLUMINE 30 % UR SOLN
URETHRAL | Status: AC
  Filled 2022-02-15: qty 100

## 2022-02-15 MED ORDER — FENTANYL CITRATE PF 50 MCG/ML IJ SOSY
25.0000 ug | PREFILLED_SYRINGE | INTRAMUSCULAR | Status: DC | PRN
  Administered 2022-02-15: 50 ug via INTRAVENOUS

## 2022-02-15 MED ORDER — PHENYLEPHRINE 80 MCG/ML (10ML) SYRINGE FOR IV PUSH (FOR BLOOD PRESSURE SUPPORT)
PREFILLED_SYRINGE | INTRAVENOUS | Status: AC
  Filled 2022-02-15: qty 30

## 2022-02-15 MED ORDER — DIATRIZOATE MEGLUMINE 30 % UR SOLN
URETHRAL | Status: DC | PRN
  Administered 2022-02-15: 10 mL via URETHRAL

## 2022-02-15 MED ORDER — SUGAMMADEX SODIUM 200 MG/2ML IV SOLN
INTRAVENOUS | Status: DC | PRN
  Administered 2022-02-15: 200 mg via INTRAVENOUS

## 2022-02-15 MED ORDER — EPHEDRINE 5 MG/ML INJ
INTRAVENOUS | Status: AC
  Filled 2022-02-15: qty 10

## 2022-02-15 SURGICAL SUPPLY — 27 items
BAG DRAIN URO TABLE W/ADPT NS (BAG) ×2 IMPLANT
BAG DRN 8 ADPR NS SKTRN CSTL (BAG) ×2
BAG DRN RND TRDRP ANRFLXCHMBR (UROLOGICAL SUPPLIES) ×2
BAG HAMPER (MISCELLANEOUS) ×2 IMPLANT
BAG URINE DRAIN 2000ML AR STRL (UROLOGICAL SUPPLIES) ×2 IMPLANT
CATH FOLEY 3WAY 30CC 22FR (CATHETERS) IMPLANT
CATH INTERMIT  6FR 70CM (CATHETERS) ×2 IMPLANT
CLOTH BEACON ORANGE TIMEOUT ST (SAFETY) ×2 IMPLANT
ELECT LOOP 22F BIPOLAR SML (ELECTROSURGICAL) ×2
ELECTRODE LOOP 22F BIPOLAR SML (ELECTROSURGICAL) ×2 IMPLANT
GLOVE BIO SURGEON STRL SZ8 (GLOVE) ×2 IMPLANT
GLOVE BIOGEL PI IND STRL 7.0 (GLOVE) ×4 IMPLANT
GLOVE ECLIPSE 6.5 STRL STRAW (GLOVE) IMPLANT
GOWN STRL REUS W/TWL LRG LVL3 (GOWN DISPOSABLE) ×2 IMPLANT
GOWN STRL REUS W/TWL XL LVL3 (GOWN DISPOSABLE) ×2 IMPLANT
GUIDEWIRE STR DUAL SENSOR (WIRE) IMPLANT
IV NS IRRIG 3000ML ARTHROMATIC (IV SOLUTION) ×4 IMPLANT
KIT TURNOVER CYSTO (KITS) ×2 IMPLANT
MANIFOLD NEPTUNE II (INSTRUMENTS) ×2 IMPLANT
PACK CYSTO (CUSTOM PROCEDURE TRAY) ×2 IMPLANT
PAD ARMBOARD 7.5X6 YLW CONV (MISCELLANEOUS) ×2 IMPLANT
PLUG CATH AND CAP STER (CATHETERS) IMPLANT
SYR 30ML LL (SYRINGE) ×2 IMPLANT
SYR TOOMEY IRRIG 70ML (MISCELLANEOUS) ×2
SYRINGE TOOMEY IRRIG 70ML (MISCELLANEOUS) ×2 IMPLANT
TOWEL OR 17X26 4PK STRL BLUE (TOWEL DISPOSABLE) ×2 IMPLANT
WATER STERILE IRR 500ML POUR (IV SOLUTION) ×2 IMPLANT

## 2022-02-15 NOTE — Transfer of Care (Signed)
Immediate Anesthesia Transfer of Care Note  Patient: Dustin Graham  Procedure(s) Performed: CYSTOSCOPY WITH RETROGRADE PYELOGRAM (Bilateral: Ureter) TRANSURETHRAL RESECTION OF BLADDER TUMOR (TURBT) (Bladder)  Patient Location: PACU  Anesthesia Type:General  Level of Consciousness: awake and patient cooperative  Airway & Oxygen Therapy: Patient Spontanous Breathing  Post-op Assessment: Report given to RN and Post -op Vital signs reviewed and stable  Post vital signs: Reviewed and stable  Last Vitals:  Vitals Value Taken Time  BP 133/83   Temp    Pulse 83 02/15/22 1123  Resp 14 02/15/22 1123  SpO2 96 % 02/15/22 1123  Vitals shown include unvalidated device data.  Last Pain:  Vitals:   02/15/22 0842  PainSc: 0-No pain         Complications:  Encounter Notable Events  Notable Event Outcome Phase Comment  Difficult to intubate - expected  Intraprocedure Filed from anesthesia note documentation.

## 2022-02-15 NOTE — Interval H&P Note (Signed)
History and Physical Interval Note:  02/15/2022 10:03 AM  Dustin Graham  has presented today for surgery, with the diagnosis of bladder tumor.  The various methods of treatment have been discussed with the patient and family. After consideration of risks, benefits and other options for treatment, the patient has consented to  Procedure(s) with comments: CYSTOSCOPY WITH RETROGRADE PYELOGRAM (Bilateral) - pt knows to arrive at 8:00 [place] (TURBT) (N/A) as a surgical intervention.  The patient's history has been reviewed, patient examined, no change in status, stable for surgery.  I have reviewed the patient's chart and labs.  Questions were answered to the patient's satisfaction.     [name]

## 2022-02-15 NOTE — Op Note (Signed)
.  Preoperative diagnosis: bladder tumor  Postoperative diagnosis: Same  Procedure: 1 cystoscopy 2. bilateral retrograde pyelography 3.  Intraoperative fluoroscopy, under one hour, with interpretation 4. Transurethral resection of bladder tumor, medium  Attending: Rosie Fate  Anesthesia: General  Estimated blood loss: Minimal  Drains: 22 French foley  Specimens: bladder tumor  Antibiotics: ancef  Findings: 3cm papillary right lateral wall/dome tumor.  Ureteral orifices in normal anatomic location. No hydronephrosis or filling defects in either collecting system  Indications: Patient is a 71 year old male with a history of bladder tumor and gross hematuria.  After discussing treatment options, they decided proceed with transurethral resection of a bladder tumor.  Procedure in detail: The patient was brought to the operating room and a brief timeout was done to ensure correct patient, correct procedure, correct site.  General anesthesia was administered patient was placed in dorsal lithotomy position.  Their genitalia was then prepped and draped in usual sterile fashion.  A rigid 32 French cystoscope was passed in the urethra and the bladder.  Bladder was inspected and we noted a 3cm bladder tumor.  the ureteral orifices were in the normal orthotopic locations.  a 6 french ureteral catheter was then instilled into the left ureteral orifice.  a gentle retrograde was obtained and findings noted above. We then turned our attention to the right side. a 6 french ureteral catheter was then instilled into the right ureteral orifice.  a gentle retrograde was obtained and findings noted above. We then removed the cystoscope and placed a resectoscope into the bladder. We proceeded to remove the large clot burden from the bladder. Once this was complete we turned our attention to the bladder tumor. Using the bipolar resectoscope we removed the bladder tumor down to the base. A subsequent muscle deep  biopsy was then taken. Hemostasis was then obtained with electrocautery. We then removed the bladder tumor chips and sent them for pathology. We then re-inspected the bladder and found no residula bleeding.  the bladder was then drained, a 22 French foley was placed and this concluded the procedure which was well tolerated by patient.  Complications: None  Condition: Stable, extubated, transferred to PACU  Plan: Patient is to be discharged home and followup in 5 days for foley catheter removal and pathology discussion.

## 2022-02-15 NOTE — Progress Notes (Signed)
Catheter teaching done with patient, reviewed leg bag and overnight instructions, sent home with blue pads, alcohol preps, catheter intact with small amount of bleeding around meatus, cleansed with washcloth and observed for continued oozing x 15 minutes with no further bleeding.    Patient states he does take a lot of BC powders, Instructed not to take these while recovering and until he sees Dr. Alyson Ingles on Monday.  Verbalized understanding.

## 2022-02-15 NOTE — Anesthesia Preprocedure Evaluation (Signed)
Anesthesia Evaluation  Patient identified by MRN, date of birth, ID band Patient awake    Reviewed: Allergy & Precautions, H&P , NPO status , Patient's Chart, lab work & pertinent test results, reviewed documented beta blocker date and time   Airway Mallampati: II  TM Distance: >3 FB Neck ROM: full    Dental no notable dental hx.    Pulmonary neg pulmonary ROS,    Pulmonary exam normal breath sounds clear to auscultation       Cardiovascular Exercise Tolerance: Good hypertension, negative cardio ROS   Rhythm:regular Rate:Normal     Neuro/Psych negative neurological ROS  negative psych ROS   GI/Hepatic Neg liver ROS, GERD  Medicated,  Endo/Other  negative endocrine ROSdiabetes, Type 2  Renal/GU negative Renal ROS  negative genitourinary   Musculoskeletal   Abdominal   Peds  Hematology negative hematology ROS (+)   Anesthesia Other Findings   Reproductive/Obstetrics negative OB ROS                             Anesthesia Physical Anesthesia Plan  ASA: 2  Anesthesia Plan: General and General LMA   Post-op Pain Management:    Induction:   PONV Risk Score and Plan: Ondansetron  Airway Management Planned:   Additional Equipment:   Intra-op Plan:   Post-operative Plan:   Informed Consent: I have reviewed the patients History and Physical, chart, labs and discussed the procedure including the risks, benefits and alternatives for the proposed anesthesia with the patient or authorized representative who has indicated his/her understanding and acceptance.     Dental Advisory Given  Plan Discussed with: CRNA  Anesthesia Plan Comments:         Anesthesia Quick Evaluation

## 2022-02-15 NOTE — Anesthesia Postprocedure Evaluation (Signed)
Anesthesia Post Note  Patient: Dustin Graham  Procedure(s) Performed: CYSTOSCOPY WITH RETROGRADE PYELOGRAM (Bilateral: Ureter) TRANSURETHRAL RESECTION OF BLADDER TUMOR (TURBT) (Bladder)  Patient location during evaluation: Phase II Anesthesia Type: General Level of consciousness: awake Pain management: pain level controlled Vital Signs Assessment: post-procedure vital signs reviewed and stable Respiratory status: spontaneous breathing and respiratory function stable Cardiovascular status: blood pressure returned to baseline and stable Postop Assessment: no headache and no apparent nausea or vomiting Anesthetic complications: yes Comments: Late entry   Encounter Notable Events  Notable Event Outcome Phase Comment  Difficult to intubate - expected  Intraprocedure Filed from anesthesia note documentation.     Last Vitals:  Vitals:   02/15/22 1227 02/15/22 1250  BP: (!) (P) 171/84 (!) 145/86  Pulse: (P) 86 78  Resp: (P) 14 14  Temp: (P) 36.9 C 36.7 C  SpO2: (P) 97% 97%    Last Pain:  Vitals:   02/15/22 1250  TempSrc: Oral  PainSc: [place]

## 2022-02-15 NOTE — Anesthesia Procedure Notes (Signed)
Procedure Name: Intubation Date/Time: 02/15/2022 10:35 AM  Performed by: Gwyndolyn Saxon, CRNAPre-anesthesia Checklist: Patient identified, Emergency Drugs available, Suction available and Patient being monitored Patient Re-evaluated:Patient Re-evaluated prior to induction Oxygen Delivery Method: Circle system utilized Preoxygenation: Pre-oxygenation with 100% oxygen Induction Type: IV induction Ventilation: Mask ventilation without difficulty and Oral airway inserted - appropriate to patient size Laryngoscope Size: Glidescope and 4 Grade View: Grade I Tube type: Oral Tube size: 8.0 mm Number of attempts: 1 Airway Equipment and Method: Patient positioned with wedge pillow and Rigid stylet Placement Confirmation: ETT inserted through vocal cords under direct vision, positive ETCO2 and breath sounds checked- equal and bilateral Secured at: 24 cm Tube secured with: Tape Dental Injury: Teeth and Oropharynx as per pre-operative assessment  Difficulty Due To: Difficulty was anticipated, Difficult Airway- due to reduced neck mobility, Difficult Airway- due to anterior larynx and Difficult Airway- due to limited oral opening Comments: Pt easy mask with oral airway; very narrow palate with limited mouth opening and neck extention.

## 2022-02-15 NOTE — Progress Notes (Signed)
Pt phoned. Stated that he was changing his foley bag and he started bleeding from around his catheter. Could not verbalize amt of bleeding. Michela Pitcher it was a "trickle" down his leg. Stated urine in foley bag was pinkish red.   I notified Dr Alyson Ingles of pt status. He stated that pt probably had a bladder spasm while changing foley bag, causing the bleeding. If bleeding persists or the foley does not drain, then pt needs to come to ER.  I notified pt of Dr Noland Fordyce instructions. Pt voiced understanding.

## 2022-02-16 DIAGNOSIS — C679 Malignant neoplasm of bladder, unspecified: Secondary | ICD-10-CM

## 2022-02-16 HISTORY — DX: Malignant neoplasm of bladder, unspecified: C67.9

## 2022-02-16 LAB — SURGICAL PATHOLOGY

## 2022-02-19 ENCOUNTER — Encounter: Payer: Self-pay | Admitting: Family Medicine

## 2022-02-19 ENCOUNTER — Ambulatory Visit (INDEPENDENT_AMBULATORY_CARE_PROVIDER_SITE_OTHER): Payer: Medicare Other | Admitting: Urology

## 2022-02-19 VITALS — BP 109/74 | HR 99

## 2022-02-19 DIAGNOSIS — R310 Gross hematuria: Secondary | ICD-10-CM

## 2022-02-19 DIAGNOSIS — C671 Malignant neoplasm of dome of bladder: Secondary | ICD-10-CM

## 2022-02-19 NOTE — Progress Notes (Unsigned)
Fill and Pull Catheter Removal  Patient is present today for a catheter removal.  Patient was cleaned and prepped in a sterile fashion 153m of sterile water/ saline was instilled into the bladder when the patient felt the urge to urinate. 322mof water was then drained from the balloon.  A 22FR foley cath was removed from the bladder no complications were noted .  Patient as then given some time to void on their own.  Patient can void  11050mn their own after some time.  Patient tolerated well.  Performed by: KouLevi AlandMA  Follow up/ Additional notes: f/u with MD

## 2022-02-19 NOTE — Progress Notes (Signed)
post void residual=121

## 2022-02-19 NOTE — Progress Notes (Unsigned)
02/19/2022 9:26 AM   [name] [birth year] [id number]  Referring provider: Loman [place], [address],  [place] [address]  Followup bladder tumor resection   HPI: Mr Dustin Graham is a 71yo here for followup after bladder tumor resection. Pathology T1G3 with no muscle in the specimen. Voiding trial passed today   PMH: Past Medical History:  Diagnosis Date   Arthritis    Bladder cancer ([facility]) 02/16/2022   GERD (gastroesophageal reflux disease)    HLD (hyperlipidemia)    Hypertension associated with diabetes ([facility]) 02/29/2016   Rosacea 01/28/2019   Type 1 diabetes ([facility])     Surgical History: Past Surgical History:  Procedure Laterality Date   broken nose     CHOLECYSTECTOMY     GALLBLADDER SURGERY     LUMBAR DISC SURGERY     fracture    Home Medications:  Allergies as of 02/19/2022       Reactions   Codeine Nausea And Vomiting        Medication List        Accurate as of February 19, 2022  9:26 AM. If you have any questions, ask your nurse or doctor.          atorvastatin 40 MG tablet Commonly known as: LIPITOR TAKE 1 TABLET BY MOUTH EVERY DAY   BC HEADACHE POWDER PO Take 1 Package by mouth daily as needed (Back pain).   BD Pen Needle Micro U/F 32G X 6 MM Misc Generic drug: Insulin Pen Needle Use with injection 6 times daily Dx E10.9   enalapril 20 MG tablet Commonly known as: VASOTEC TAKE 1 TABLET (20 MG TOTAL) BY MOUTH DAILY. MUST BE SEEN FOR ANY FURTHER REFILLS   FreeStyle Libre 2 Reader Devi Use to test blood sugar up to 6 times daily. DX; E11.9   FreeStyle Libre 2 Sensor Misc Use to test blood sugar up to 6 times daily. DX: E11.9   HYDROcodone-acetaminophen 5-325 MG tablet Commonly known as: Norco Take 1 tablet by mouth every 6 (six) hours as needed for moderate pain.   NovoLOG FlexPen 100 UNIT/ML FlexPen Generic drug: insulin aspart Inject 30 Units into the skin 3 (three) times daily with meals. What changed:  how  much to take when to take this additional instructions   omeprazole 20 MG capsule Commonly known as: PRILOSEC TAKE 1 CAPSULE BY MOUTH EVERY DAY   OneTouch Delica Lancets 29U Misc   traMADol 50 MG tablet Commonly known as: ULTRAM Take 2 tablets (100 mg total) by mouth every 12 (twelve) hours as needed. What changed: when to take this   Tresiba FlexTouch 200 UNIT/ML FlexTouch Pen Generic drug: insulin degludec INJECT 18 UNITS INTO THE SKIN IN THE MORNING AND AT BEDTIME. What changed:  how much to take when to take this additional instructions        Allergies:  Allergies  Allergen Reactions   Codeine Nausea And Vomiting    Family History: Family History  Problem Relation Age of Onset   Peptic Ulcer Father    Diabetes Paternal Aunt    Colon cancer Neg Hx    Esophageal cancer Neg Hx    Stomach cancer Neg Hx    Colon polyps Neg Hx     Social History:  reports that he has never smoked. He has never used smokeless tobacco. He reports current alcohol use of about 3.0 - 4.0 standard drinks of alcohol per week. He reports that he does not currently use drugs  after having used the following drugs: Marijuana.  ROS: All other review of systems were reviewed and are negative except what is noted above in HPI  Physical Exam: There were no vitals taken for this visit.  Constitutional:  Alert and oriented, No acute distress. HEENT: [place] AT, moist mucus membranes.  Trachea midline, no masses. Cardiovascular: No clubbing, cyanosis, or edema. Respiratory: Normal respiratory effort, no increased work of breathing. GI: Abdomen is soft, nontender, nondistended, no abdominal masses GU: No CVA tenderness.  Lymph: No cervical or inguinal lymphadenopathy. Skin: No rashes, bruises or suspicious lesions. Neurologic: Grossly intact, no focal deficits, moving all 4 extremities. Psychiatric: Normal mood and affect.  Laboratory Data: Lab Results  Component Value Date   WBC 8.2  01/30/2022   HGB 14.7 01/30/2022   HCT 42.5 01/30/2022   MCV 93 01/30/2022   PLT 187 01/30/2022    Lab Results  Component Value Date   CREATININE 0.71 (L) 01/30/2022    No results found for: "PSA"  No results found for: "TESTOSTERONE"  Lab Results  Component Value Date   HGBA1C 6.7 (H) 01/30/2022    Urinalysis    Component Value Date/Time   APPEARANCEUR Clear 02/05/2022 1339   GLUCOSEU Negative 02/05/2022 1339   BILIRUBINUR Negative 02/05/2022 1339   PROTEINUR Negative 02/05/2022 1339   NITRITE Negative 02/05/2022 1339   LEUKOCYTESUR Negative 02/05/2022 1339    Lab Results  Component Value Date   LABMICR Comment 02/05/2022   WBCUA None seen 12/13/2021   LABEPIT None seen 12/13/2021   MUCUS Present 12/13/2021   BACTERIA Many (A) 12/13/2021    Pertinent Imaging:  No results found for this or any previous visit.  No results found for this or any previous visit.  No results found for this or any previous visit.  No results found for this or any previous visit.  No results found for this or any previous visit.  No valid procedures specified. Results for orders placed in visit on 12/13/21  CT HEMATURIA WORKUP  Narrative CLINICAL DATA:  Gross hematuria for 3 weeks. Chronic right-sided abdominal pain.  EXAM: CT ABDOMEN AND PELVIS WITHOUT AND WITH CONTRAST  TECHNIQUE: Multidetector CT imaging of the abdomen and pelvis was performed following the standard protocol before and following the bolus administration of intravenous contrast.  RADIATION DOSE REDUCTION: This exam was performed according to the departmental dose-optimization program which includes automated exposure control, adjustment of the mA and/or kV according to patient size and/or use of iterative reconstruction technique.  CONTRAST:  118m OMNIPAQUE IOHEXOL 300 MG/ML  SOLN  COMPARISON:  None Available.  FINDINGS: Lower Chest: No acute findings.  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 No hepatic masses identified.  Pancreas:  No mass or inflammatory changes.  Spleen: Within normal limits in size and appearance.  Adrenals/Urinary Tract: No adrenal masses identified. No evidence of urolithiasis or hydronephrosis. A small benign-appearing cyst is seen in the lower pole of the right kidney (no followup imaging recommended). No renal masses identified. No masses seen involving the collecting systems or ureters. A focal nodular density with probable mild enhancement is seen along the bladder wall in the right anterior base which measures approximately 1.3 cm (see image 87/7). This is suspicious for a small bladder carcinoma.  Stomach/Bowel: No evidence of obstruction, inflammatory process or abnormal fluid collections. Normal appendix visualized. Diverticulosis is seen mainly involving the sigmoid colon, however there is no evidence of diverticulitis.  Vascular/Lymphatic: No pathologically enlarged lymph nodes. No acute vascular findings.  Aortic atherosclerotic calcification incidentally noted.  Reproductive:  No mass or other significant abnormality.  Other:  None.  Musculoskeletal:  No suspicious bone lesions identified.  IMPRESSION: 1.3 cm nodular density with probable mild enhancement along the bladder wall in the right anterior base, suspicious for small bladder carcinoma. Cystoscopy is recommended for further evaluation.  No radiographic evidence of upper urinary tract neoplasm, urolithiasis, or hydronephrosis.  Hepatic cirrhosis. No evidence of hepatic neoplasm.  Colonic diverticulosis, without radiographic evidence of diverticulitis.  Aortic Atherosclerosis (ICD10-I70.0).   Electronically Signed By: Marlaine Hind M.D. On: 01/17/2022 22:08  No results found for this or any previous visit.   Assessment & Plan:    1. 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 After discussing the treatments we have agreed to pursue repeat resection. Risks/benefits/alternatives discussed - Bladder Voiding Trial - Foley catheter - discontinue   No follow-ups on file.  [name], MD  [facility] [place]

## 2022-02-20 ENCOUNTER — Ambulatory Visit (INDEPENDENT_AMBULATORY_CARE_PROVIDER_SITE_OTHER): Payer: Medicare Other | Admitting: Urology

## 2022-02-20 DIAGNOSIS — R310 Gross hematuria: Secondary | ICD-10-CM | POA: Diagnosis not present

## 2022-02-20 DIAGNOSIS — C671 Malignant neoplasm of dome of bladder: Secondary | ICD-10-CM

## 2022-02-20 LAB — BLADDER SCAN AMB NON-IMAGING: Scan Result: 32

## 2022-02-20 NOTE — Progress Notes (Signed)
post void residual=32

## 2022-02-22 ENCOUNTER — Encounter: Payer: Self-pay | Admitting: Urology

## 2022-02-22 NOTE — Patient Instructions (Signed)

## 2022-02-23 ENCOUNTER — Ambulatory Visit: Payer: Medicare Other | Admitting: Urology

## 2022-02-28 ENCOUNTER — Telehealth: Payer: Self-pay

## 2022-02-28 NOTE — Telephone Encounter (Signed)
I spoke with Dustin Graham. We have discussed possible surgery dates and 03/22/2022 was agreed upon by all parties. Patient given information about surgery date, what to expect pre-operatively and post operatively.    We discussed that a pre-op nurse will be calling to set up the pre-op visit that will take place prior to surgery. Informed patient that our office will communicate any additional care to be provided after surgery.    Patients questions or concerns were discussed during our call. Advised to call our office should there be any additional information, questions or concerns that arise. Patient verbalized understanding.

## 2022-03-10 ENCOUNTER — Other Ambulatory Visit: Payer: Self-pay | Admitting: Family Medicine

## 2022-03-10 DIAGNOSIS — E1159 Type 2 diabetes mellitus with other circulatory complications: Secondary | ICD-10-CM

## 2022-03-13 ENCOUNTER — Other Ambulatory Visit: Payer: Self-pay | Admitting: Family Medicine

## 2022-03-13 DIAGNOSIS — E1159 Type 2 diabetes mellitus with other circulatory complications: Secondary | ICD-10-CM

## 2022-03-19 ENCOUNTER — Other Ambulatory Visit: Payer: Self-pay

## 2022-03-19 ENCOUNTER — Encounter (HOSPITAL_COMMUNITY): Payer: Self-pay

## 2022-03-19 ENCOUNTER — Encounter (HOSPITAL_COMMUNITY)
Admission: RE | Admit: 2022-03-19 | Discharge: 2022-03-19 | Disposition: A | Discharge status: Home / Self Care | Payer: Medicare Other | Source: Ambulatory Visit | Attending: Urology | Admitting: Urology

## 2022-03-22 ENCOUNTER — Ambulatory Visit (HOSPITAL_COMMUNITY)
Admission: RE | Admit: 2022-03-22 | Discharge: 2022-03-22 | Disposition: A | Discharge status: Home / Self Care | Payer: Medicare Other | Attending: Urology | Admitting: Urology

## 2022-03-22 ENCOUNTER — Ambulatory Visit (HOSPITAL_BASED_OUTPATIENT_CLINIC_OR_DEPARTMENT_OTHER): Payer: Medicare Other | Admitting: Certified Registered Nurse Anesthetist

## 2022-03-22 ENCOUNTER — Encounter (HOSPITAL_COMMUNITY)
Admission: RE | Disposition: A | Discharge status: Home / Self Care | Payer: Self-pay | Source: Home / Self Care | Attending: Urology

## 2022-03-22 ENCOUNTER — Other Ambulatory Visit: Payer: Self-pay

## 2022-03-22 ENCOUNTER — Encounter (HOSPITAL_COMMUNITY): Payer: Self-pay | Admitting: Urology

## 2022-03-22 ENCOUNTER — Ambulatory Visit (HOSPITAL_COMMUNITY): Payer: Medicare Other | Admitting: Certified Registered Nurse Anesthetist

## 2022-03-22 DIAGNOSIS — N3000 Acute cystitis without hematuria: Secondary | ICD-10-CM

## 2022-03-22 DIAGNOSIS — C679 Malignant neoplasm of bladder, unspecified: Secondary | ICD-10-CM

## 2022-03-22 DIAGNOSIS — I10 Essential (primary) hypertension: Secondary | ICD-10-CM | POA: Insufficient documentation

## 2022-03-22 DIAGNOSIS — E109 Type 1 diabetes mellitus without complications: Secondary | ICD-10-CM | POA: Insufficient documentation

## 2022-03-22 DIAGNOSIS — E1065 Type 1 diabetes mellitus with hyperglycemia: Secondary | ICD-10-CM

## 2022-03-22 DIAGNOSIS — K219 Gastro-esophageal reflux disease without esophagitis: Secondary | ICD-10-CM | POA: Diagnosis not present

## 2022-03-22 DIAGNOSIS — Z794 Long term (current) use of insulin: Secondary | ICD-10-CM | POA: Insufficient documentation

## 2022-03-22 DIAGNOSIS — Z8551 Personal history of malignant neoplasm of bladder: Secondary | ICD-10-CM | POA: Insufficient documentation

## 2022-03-22 DIAGNOSIS — D494 Neoplasm of unspecified behavior of bladder: Secondary | ICD-10-CM

## 2022-03-22 HISTORY — PX: TRANSURETHRAL RESECTION OF BLADDER TUMOR: SHX2575

## 2022-03-22 LAB — GLUCOSE, CAPILLARY
Glucose-Capillary: 132 mg/dL — ABNORMAL HIGH (ref 70–99)
Glucose-Capillary: 180 mg/dL — ABNORMAL HIGH (ref 70–99)

## 2022-03-22 SURGERY — TURBT (TRANSURETHRAL RESECTION OF BLADDER TUMOR)
Anesthesia: General | Site: Bladder

## 2022-03-22 MED ORDER — PHENYLEPHRINE 80 MCG/ML (10ML) SYRINGE FOR IV PUSH (FOR BLOOD PRESSURE SUPPORT)
PREFILLED_SYRINGE | INTRAVENOUS | Status: AC
  Filled 2022-03-22: qty 10

## 2022-03-22 MED ORDER — FENTANYL CITRATE (PF) 100 MCG/2ML IJ SOLN
INTRAMUSCULAR | Status: AC
  Filled 2022-03-22: qty 2

## 2022-03-22 MED ORDER — PHENYLEPHRINE 80 MCG/ML (10ML) SYRINGE FOR IV PUSH (FOR BLOOD PRESSURE SUPPORT)
PREFILLED_SYRINGE | INTRAVENOUS | Status: DC | PRN
  Administered 2022-03-22: 160 ug via INTRAVENOUS
  Administered 2022-03-22 (×3): 80 ug via INTRAVENOUS
  Administered 2022-03-22: 160 ug via INTRAVENOUS

## 2022-03-22 MED ORDER — ROCURONIUM BROMIDE 10 MG/ML (PF) SYRINGE
PREFILLED_SYRINGE | INTRAVENOUS | Status: AC
  Filled 2022-03-22: qty 10

## 2022-03-22 MED ORDER — OXYCODONE HCL 5 MG PO TABS: 5.0000 mg | ORAL_TABLET | Freq: Once | ORAL | Status: DC | PRN

## 2022-03-22 MED ORDER — ROCURONIUM BROMIDE 10 MG/ML (PF) SYRINGE
PREFILLED_SYRINGE | INTRAVENOUS | Status: DC | PRN
  Administered 2022-03-22: 50 mg via INTRAVENOUS

## 2022-03-22 MED ORDER — LIDOCAINE HCL (CARDIAC) PF 100 MG/5ML IV SOSY
PREFILLED_SYRINGE | INTRAVENOUS | Status: DC | PRN
  Administered 2022-03-22: 60 mg via INTRATRACHEAL

## 2022-03-22 MED ORDER — LACTATED RINGERS IV SOLN: INTRAVENOUS | Status: DC | PRN

## 2022-03-22 MED ORDER — ONDANSETRON HCL 4 MG/2ML IJ SOLN: 4.0000 mg | Freq: Once | INTRAMUSCULAR | Status: DC | PRN

## 2022-03-22 MED ORDER — PROPOFOL 10 MG/ML IV BOLUS
INTRAVENOUS | Status: AC
  Filled 2022-03-22: qty 20

## 2022-03-22 MED ORDER — CHLORHEXIDINE GLUCONATE 0.12 % MT SOLN: 15.0000 mL | Freq: Once | OROMUCOSAL | Status: DC

## 2022-03-22 MED ORDER — LACTATED RINGERS IV SOLN: INTRAVENOUS | Status: DC

## 2022-03-22 MED ORDER — PROPOFOL 10 MG/ML IV BOLUS
INTRAVENOUS | Status: DC | PRN
  Administered 2022-03-22: 150 mg via INTRAVENOUS

## 2022-03-22 MED ORDER — LIDOCAINE HCL (PF) 2 % IJ SOLN
INTRAMUSCULAR | Status: AC
  Filled 2022-03-22: qty 15

## 2022-03-22 MED ORDER — STERILE WATER FOR IRRIGATION IR SOLN
Status: DC | PRN
  Administered 2022-03-22: 250 mL

## 2022-03-22 MED ORDER — HYDROCODONE-ACETAMINOPHEN 5-325 MG PO TABS: 1.0000 | ORAL_TABLET | Freq: Four times a day (QID) | ORAL | 0 refills | Status: DC | PRN

## 2022-03-22 MED ORDER — ONDANSETRON HCL 4 MG/2ML IJ SOLN
INTRAMUSCULAR | Status: DC | PRN
  Administered 2022-03-22: 4 mg via INTRAVENOUS

## 2022-03-22 MED ORDER — ORAL CARE MOUTH RINSE: 15.0000 mL | Freq: Once | OROMUCOSAL | Status: DC

## 2022-03-22 MED ORDER — SUGAMMADEX SODIUM 200 MG/2ML IV SOLN
INTRAVENOUS | Status: DC | PRN
  Administered 2022-03-22: 200 mg via INTRAVENOUS

## 2022-03-22 MED ORDER — OXYCODONE HCL 5 MG/5ML PO SOLN: 5.0000 mg | Freq: Once | ORAL | Status: DC | PRN

## 2022-03-22 MED ORDER — CEFAZOLIN SODIUM-DEXTROSE 2-4 GM/100ML-% IV SOLN
2.0000 g | INTRAVENOUS | Status: AC
  Administered 2022-03-22: 2 g via INTRAVENOUS
  Filled 2022-03-22: qty 100

## 2022-03-22 MED ORDER — FENTANYL CITRATE (PF) 100 MCG/2ML IJ SOLN
INTRAMUSCULAR | Status: DC | PRN
  Administered 2022-03-22: 100 ug via INTRAVENOUS
  Administered 2022-03-22: 50 ug via INTRAVENOUS

## 2022-03-22 MED ORDER — SODIUM CHLORIDE 0.9 % IR SOLN
Status: DC | PRN
  Administered 2022-03-22: 3000 mL

## 2022-03-22 MED ORDER — FENTANYL CITRATE PF 50 MCG/ML IJ SOSY
25.0000 ug | PREFILLED_SYRINGE | INTRAMUSCULAR | Status: DC | PRN
  Administered 2022-03-22: 50 ug via INTRAVENOUS
  Filled 2022-03-22: qty 1

## 2022-03-22 SURGICAL SUPPLY — 22 items
BAG DRAIN URO TABLE W/ADPT NS (BAG) ×1 IMPLANT
BAG DRN 8 ADPR NS SKTRN CSTL (BAG) ×1
BAG DRN RND TRDRP ANRFLXCHMBR (UROLOGICAL SUPPLIES) ×1
BAG HAMPER (MISCELLANEOUS) ×1 IMPLANT
BAG URINE DRAIN 2000ML AR STRL (UROLOGICAL SUPPLIES) ×1 IMPLANT
CATH FOLEY LATEX FREE 22FR (CATHETERS) ×1
CATH FOLEY LF 22FR (CATHETERS) IMPLANT
CLOTH BEACON ORANGE TIMEOUT ST (SAFETY) ×1 IMPLANT
ELECT LOOP 22F BIPOLAR SML (ELECTROSURGICAL) ×1
ELECTRODE LOOP 22F BIPOLAR SML (ELECTROSURGICAL) ×1 IMPLANT
GLOVE BIO SURGEON STRL SZ8 (GLOVE) ×1 IMPLANT
GLOVE BIOGEL PI IND STRL 7.0 (GLOVE) ×2 IMPLANT
GOWN STRL REUS W/TWL LRG LVL3 (GOWN DISPOSABLE) ×1 IMPLANT
GOWN STRL REUS W/TWL XL LVL3 (GOWN DISPOSABLE) ×1 IMPLANT
IV NS IRRIG 3000ML ARTHROMATIC (IV SOLUTION) ×2 IMPLANT
KIT TURNOVER CYSTO (KITS) ×1 IMPLANT
PACK CYSTO (CUSTOM PROCEDURE TRAY) ×1 IMPLANT
PAD ARMBOARD 7.5X6 YLW CONV (MISCELLANEOUS) ×1 IMPLANT
SYR 30ML LL (SYRINGE) ×1 IMPLANT
SYR TOOMEY IRRIG 70ML (MISCELLANEOUS) ×1
SYRINGE TOOMEY IRRIG 70ML (MISCELLANEOUS) ×1 IMPLANT
TOWEL OR 17X26 4PK STRL BLUE (TOWEL DISPOSABLE) ×1 IMPLANT

## 2022-03-22 NOTE — Anesthesia Procedure Notes (Signed)
Procedure Name: Intubation Date/Time: 03/22/2022 7:56 AM  Performed by: Karna Dupes, CRNAPre-anesthesia Checklist: Patient identified, Emergency Drugs available, Suction available and Patient being monitored Patient Re-evaluated:Patient Re-evaluated prior to induction Oxygen Delivery Method: Circle system utilized Preoxygenation: Pre-oxygenation with 100% oxygen Induction Type: IV induction Ventilation: Mask ventilation without difficulty Laryngoscope Size: Glidescope and 3 Grade View: Grade I Tube type: Oral Tube size: 7.5 mm Number of attempts: 1 Airway Equipment and Method: Rigid stylet and Video-laryngoscopy Placement Confirmation: positive ETCO2, breath sounds checked- equal and bilateral and ETT inserted through vocal cords under direct vision Secured at: 21 cm Tube secured with: Tape Dental Injury: Teeth and Oropharynx as per pre-operative assessment

## 2022-03-22 NOTE — Interval H&P Note (Signed)
History and Physical Interval Note:  03/22/2022 7:32 AM  Dustin Graham  has presented today for surgery, with the diagnosis of bladder cancer.  The various methods of treatment have been discussed with the patient and family. After consideration of risks, benefits and other options for treatment, the patient has consented to  Procedure(s): TRANSURETHRAL RESECTION OF BLADDER TUMOR (TURBT) (N/A) as a surgical intervention.  The patient's history has been reviewed, patient examined, no change in status, stable for surgery.  I have reviewed the patient's chart and labs.  Questions were answered to the patient's satisfaction.     [name]

## 2022-03-22 NOTE — Anesthesia Preprocedure Evaluation (Addendum)
Anesthesia Evaluation  Patient identified by MRN, date of birth, ID band Patient awake    Reviewed: Allergy & Precautions, H&P , NPO status , Patient's Chart, lab work & pertinent test results, reviewed documented beta blocker date and time   Airway Mallampati: II  TM Distance: >3 FB Neck ROM: full    Dental no notable dental hx.    Pulmonary neg pulmonary ROS,    Pulmonary exam normal breath sounds clear to auscultation       Cardiovascular Exercise Tolerance: Good hypertension, negative cardio ROS   Rhythm:regular Rate:Normal     Neuro/Psych negative neurological ROS  negative psych ROS   GI/Hepatic Neg liver ROS, GERD  Medicated,  Endo/Other  negative endocrine ROSdiabetes, Type 2  Renal/GU negative Renal ROS  negative genitourinary   Musculoskeletal   Abdominal   Peds  Hematology negative hematology ROS (+)   Anesthesia Other Findings   Reproductive/Obstetrics negative OB ROS                             Anesthesia Physical  Anesthesia Plan  ASA: 2  Anesthesia Plan: General   Post-op Pain Management:    Induction:   PONV Risk Score and Plan: Ondansetron  Airway Management Planned: Oral ETT  Additional Equipment:   Intra-op Plan:   Post-operative Plan:   Informed Consent: I have reviewed the patients History and Physical, chart, labs and discussed the procedure including the risks, benefits and alternatives for the proposed anesthesia with the patient or authorized representative who has indicated his/her understanding and acceptance.     Dental Advisory Given  Plan Discussed with: CRNA  Anesthesia Plan Comments:        Anesthesia Quick Evaluation

## 2022-03-22 NOTE — Transfer of Care (Signed)
Immediate Anesthesia Transfer of Care Note  Patient: Dustin Graham  Procedure(s) Performed: TRANSURETHRAL RESECTION OF BLADDER TUMOR (TURBT) (Bladder)  Patient Location: PACU  Anesthesia Type:General  Level of Consciousness: awake and alert   Airway & Oxygen Therapy: Patient Spontanous Breathing  Post-op Assessment: Report given to RN and Post -op Vital signs reviewed and stable  Post vital signs: Reviewed and stable   Last Vitals:  Vitals Value Taken Time  BP 132/75 03/22/22 0842  Temp 36.8 C 03/22/22 0842  Pulse 84 03/22/22 0842  Resp 16 03/22/22 0842  SpO2 97 % 03/22/22 0842    Last Pain:  Vitals:   03/22/22 0656  PainSc: 0-No pain         Complications: No notable events documented.

## 2022-03-22 NOTE — Op Note (Signed)
.  Preoperative diagnosis: bladder tumor  Postoperative diagnosis: Same  Procedure: 1 cystoscopy 2.Transurethral resection of bladder tumor, medium  Attending: Rosie Fate  Anesthesia: General  Estimated blood loss: Minimal  Drains: 22 French foley  Specimens: bladder tumor  Antibiotics: ancef  Findings: 3cm sessile dome tumor.  Ureteral orifices in normal anatomic location.   Indications: Patient is a 71 year old male with a history of high grade bladder cancer on previous bladder tumor resection.  After discussing treatment options, they decided proceed with transurethral resection of a bladder tumor.  Procedure in detail: The patient was brought to the operating room and a brief timeout was done to ensure correct patient, correct procedure, correct site.  General anesthesia was administered patient was placed in dorsal lithotomy position.  Their genitalia was then prepped and draped in usual sterile fashion.  A rigid 27 French cystoscope was passed in the urethra and the bladder.  Bladder was inspected and we noted a 3cm bladder tumor at the dome.  the ureteral orifices were in the normal orthotopic locations.  We then removed the cystoscope and placed a resectoscope into the bladder. We proceeded to remove the large clot burden from the bladder. Once this was complete we turned our attention to the bladder tumor. Using the bipolar resectoscope we removed the bladder tumor down to the base. A subsequent muscle deep biopsy was then taken. Hemostasis was then obtained with electrocautery. We then removed the bladder tumor chips and sent them for pathology. We then re-inspected the bladder and found no residula bleeding.  the bladder was then drained, a 22 French foley was placed and this concluded the procedure which was well tolerated by patient.  Complications: None  Condition: Stable, extubated, transferred to PACU  Plan: Patient is to be discharged home and followup in 5 days  for foley catheter removal and pathology discussion.

## 2022-03-23 NOTE — Anesthesia Postprocedure Evaluation (Signed)
Anesthesia Post Note  Patient: Dustin Graham  Procedure(s) Performed: TRANSURETHRAL RESECTION OF BLADDER TUMOR (TURBT) (Bladder)  Patient location during evaluation: Phase II Anesthesia Type: General Level of consciousness: awake Pain management: pain level controlled Vital Signs Assessment: post-procedure vital signs reviewed and stable Respiratory status: spontaneous breathing and respiratory function stable Cardiovascular status: blood pressure returned to baseline and stable Postop Assessment: no headache and no apparent nausea or vomiting Anesthetic complications: no Comments: Late entry   No notable events documented.   Last Vitals:  Vitals:   03/22/22 0930 03/22/22 0938  BP: 131/82 (!) 145/80  Pulse: 82 84  Resp: 18 16  Temp:  37.2 C  SpO2:  96%    Last Pain:  Vitals:   03/23/22 1102  TempSrc:   PainSc: 0-No pain                 Louann Sjogren

## 2022-03-24 ENCOUNTER — Other Ambulatory Visit: Payer: Self-pay | Admitting: Family Medicine

## 2022-03-24 DIAGNOSIS — E1159 Type 2 diabetes mellitus with other circulatory complications: Secondary | ICD-10-CM

## 2022-03-24 DIAGNOSIS — E782 Mixed hyperlipidemia: Secondary | ICD-10-CM

## 2022-03-26 ENCOUNTER — Ambulatory Visit (INDEPENDENT_AMBULATORY_CARE_PROVIDER_SITE_OTHER): Payer: Medicare Other | Admitting: Physician Assistant

## 2022-03-26 ENCOUNTER — Encounter: Payer: Self-pay | Admitting: Physician Assistant

## 2022-03-26 VITALS — BP 135/83 | HR 108 | Wt 208.0 lb

## 2022-03-26 DIAGNOSIS — C671 Malignant neoplasm of dome of bladder: Secondary | ICD-10-CM

## 2022-03-26 LAB — SURGICAL PATHOLOGY

## 2022-03-26 NOTE — Progress Notes (Signed)
Assessment: 1. 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 Alyson Ingles per the pt. Return to the office this afternoon if voiding difficulties. ED precautions discussed.    Chief Complaint: No chief complaint on file.   HPI: Dustin Graham is a 71 y.o. male who presents for continued evaluation of bladder tumor and gross hematuria. Pt is 4 days post op TURBT.  Patient has tolerated the Foley well and gross hematuria seems to be clearing.  Minimal pain.. Voiding trial successful  Portions of the above documentation were copied from a prior visit for review purposes only.  Allergies: Allergies  Allergen Reactions   Codeine Nausea And Vomiting    PMH: Past Medical History:  Diagnosis Date   Arthritis    Bladder cancer ([place]) 02/16/2022   GERD (gastroesophageal reflux disease)    HLD (hyperlipidemia)    Hypertension associated with diabetes ([place]) 02/29/2016   Rosacea 01/28/2019   Type 1 diabetes ([place])     PSH: Past Surgical History:  Procedure Laterality Date   broken nose     CHOLECYSTECTOMY     CYSTOSCOPY W/ RETROGRADES Bilateral 02/15/2022   Procedure: CYSTOSCOPY WITH RETROGRADE PYELOGRAM;  Surgeon: Cleon Gustin, MD;  Location: [facility];  Service: Urology;  Laterality: Bilateral;  pt knows to arrive at 8:00   [place]     fracture   TRANSURETHRAL RESECTION OF BLADDER TUMOR N/A 02/15/2022   Procedure: TRANSURETHRAL RESECTION OF BLADDER TUMOR (TURBT);  Surgeon: Cleon Gustin, MD;  Location: [facility];  Service: Urology;  Laterality: N/A;    SH: Social History   Tobacco Use   Smoking status: Never   Smokeless tobacco: Never  Vaping Use   Vaping Use: Never used  Substance Use Topics   Alcohol use: Yes    Alcohol/week: 3.0 - 4.0 standard drinks of alcohol    Types: 3 - 4 Standard drinks or equivalent per week    Comment: several beers and  a couple of shots per day   Drug use: Not Currently    Types: Marijuana    ROS: All other review of systems were reviewed and are negative except what is noted above in HPI  PE: BP 135/83   Pulse (!) 108   Wt 208 lb (94.3 kg)   BMI 28.21 kg/m  GENERAL APPEARANCE:  Well appearing, well developed, well nourished, NAD HEENT:  Atraumatic, normocephalic NECK:  Supple. Trachea midline ABDOMEN:  Soft, non-tender, no masses EXTREMITIES:  Moves all extremities well NEUROLOGIC:  Alert and oriented x 3, normal gait MENTAL STATUS:  appropriate BACK:  Non-tender to palpation, No CVAT SKIN:  Warm, dry, and intact   Results: Laboratory Data: Lab Results  Component Value Date   WBC 8.2 01/30/2022   HGB 14.7 01/30/2022   HCT 42.5 01/30/2022   MCV 93 01/30/2022   PLT 187 01/30/2022    Lab Results  Component Value Date   CREATININE 0.71 (L) 01/30/2022    No results found for: "PSA"  No results found for: "TESTOSTERONE"  Lab Results  Component Value Date   HGBA1C 6.7 (H) 01/30/2022    Urinalysis    Component Value Date/Time   APPEARANCEUR Clear 02/05/2022 1339   GLUCOSEU Negative 02/05/2022 1339   BILIRUBINUR Negative 02/05/2022 1339   PROTEINUR Negative 02/05/2022 1339   NITRITE Negative 02/05/2022 1339   LEUKOCYTESUR Negative 02/05/2022 1339  Lab Results  Component Value Date   LABMICR Comment 02/05/2022   WBCUA None seen 12/13/2021   LABEPIT None seen 12/13/2021   MUCUS Present 12/13/2021   BACTERIA Many (A) 12/13/2021    Pertinent Imaging: No results found for this or any previous visit.  No results found for this or any previous visit.  No results found for this or any previous visit.  No results found for this or any previous visit.  No results found for this or any previous visit.  No valid procedures specified. Results for orders placed in visit on 12/13/21  CT HEMATURIA WORKUP  Narrative CLINICAL DATA:  Gross hematuria for 3 weeks. Chronic  right-sided abdominal pain.  EXAM: CT ABDOMEN AND PELVIS WITHOUT AND WITH CONTRAST  TECHNIQUE: Multidetector CT imaging of the abdomen and pelvis was performed following the standard protocol before and following the bolus administration of intravenous contrast.  RADIATION DOSE REDUCTION: This exam was performed according to the departmental dose-optimization program which includes automated exposure control, adjustment of the mA and/or kV according to patient size and/or use of iterative reconstruction technique.  CONTRAST:  171m OMNIPAQUE IOHEXOL 300 MG/ML  SOLN  COMPARISON:  None Available.  FINDINGS: Lower Chest: No acute findings.  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 No hepatic masses identified.  Pancreas:  No mass or inflammatory changes.  Spleen: Within normal limits in size and appearance.  Adrenals/Urinary Tract: No adrenal masses identified. No evidence of urolithiasis or hydronephrosis. A small benign-appearing cyst is seen in the lower pole of the right kidney (no followup imaging recommended). No renal masses identified. No masses seen involving the collecting systems or ureters. A focal nodular density with probable mild enhancement is seen along the bladder wall in the right anterior base which measures approximately 1.3 cm (see image 87/7). This is suspicious for a small bladder carcinoma.  Stomach/Bowel: No evidence of obstruction, inflammatory process or abnormal fluid collections. Normal appendix visualized. Diverticulosis is seen mainly involving the sigmoid colon, however there is no evidence of diverticulitis.  Vascular/Lymphatic: No pathologically enlarged lymph nodes. No acute vascular findings. Aortic atherosclerotic calcification incidentally noted.  Reproductive:  No mass or other significant abnormality.  Other:  None.  Musculoskeletal:  No suspicious bone lesions identified.  IMPRESSION: 1.3 cm nodular density with  probable mild enhancement along the bladder wall in the right anterior base, suspicious for small bladder carcinoma. Cystoscopy is recommended for further evaluation.  No radiographic evidence of upper urinary tract neoplasm, urolithiasis, or hydronephrosis.  Hepatic cirrhosis. No evidence of hepatic neoplasm.  Colonic diverticulosis, without radiographic evidence of diverticulitis.  Aortic Atherosclerosis (ICD10-I70.0).   Electronically Signed By: JMarlaine HindM.D. On: 01/17/2022 22:08  No results found for this or any previous visit.  No results found for this or any previous visit (from the past 24 hour(s)).

## 2022-03-26 NOTE — Progress Notes (Signed)
Fill and Pull Catheter Removal  Patient is present today for a catheter removal.  Patient was cleaned and prepped in a sterile fashion 130 ml of sterile water/ saline was instilled into the bladder when the patient felt the urge to urinate. 20 ml of water was then drained from the balloon.  A 22 FR foley cath was removed from the bladder no complications were noted .  Patient as then given some time to void on their own.  Patient can void  75 ml on their own after some time.  Patient tolerated well.  Performed by: Marisue Brooklyn, CMA  Follow up/ Additional notes: Follow up as scheduled

## 2022-03-27 ENCOUNTER — Encounter (HOSPITAL_COMMUNITY): Payer: Self-pay | Admitting: Urology

## 2022-03-28 ENCOUNTER — Ambulatory Visit (INDEPENDENT_AMBULATORY_CARE_PROVIDER_SITE_OTHER): Payer: Medicare Other | Admitting: Urology

## 2022-03-28 ENCOUNTER — Encounter: Payer: Self-pay | Admitting: Urology

## 2022-03-28 VITALS — BP 135/82 | HR 90

## 2022-03-28 DIAGNOSIS — N3001 Acute cystitis with hematuria: Secondary | ICD-10-CM | POA: Diagnosis not present

## 2022-03-28 DIAGNOSIS — N3289 Other specified disorders of bladder: Secondary | ICD-10-CM | POA: Diagnosis not present

## 2022-03-28 DIAGNOSIS — C671 Malignant neoplasm of dome of bladder: Secondary | ICD-10-CM

## 2022-03-28 DIAGNOSIS — Z8551 Personal history of malignant neoplasm of bladder: Secondary | ICD-10-CM

## 2022-03-28 LAB — URINALYSIS, ROUTINE W REFLEX MICROSCOPIC
Bilirubin, UA: NEGATIVE
Glucose, UA: NEGATIVE
Nitrite, UA: NEGATIVE
Specific Gravity, UA: 1.025 (ref 1.005–1.030)
Urobilinogen, Ur: 2 mg/dL — ABNORMAL HIGH (ref 0.2–1.0)
pH, UA: 5.5 (ref 5.0–7.5)

## 2022-03-28 LAB — MICROSCOPIC EXAMINATION
RBC, Urine: >30 /hpf — ABNORMAL HIGH (ref 0–2)
WBC, UA: >30 /hpf — ABNORMAL HIGH (ref 0–5)

## 2022-03-28 MED ORDER — NITROFURANTOIN MONOHYD MACRO 100 MG PO CAPS: 100.0000 mg | ORAL_CAPSULE | Freq: Two times a day (BID) | ORAL | 0 refills | Status: DC

## 2022-03-28 NOTE — Patient Instructions (Signed)

## 2022-03-28 NOTE — Progress Notes (Signed)
03/28/2022 9:40 AM   [name] February 02, 1951 [id number]  Referring provider: Loman Brooklyn, [place],  [place] [id number]  Followup bladder cancer   HPI: Mr Magro is a 71yo here for followup for bladder cancer. Repeat bladder resection yielded granulation tissue/inflammation and no malignancy. Voiding trial passed. NO pelvis pain. He has worsening urinary urgency and frequency for the past 3 days. No hematuria. UA today concerning for infection. No other complaints today   PMH: Past Medical History:  Diagnosis Date   Arthritis    Bladder cancer ([place]) 02/16/2022   GERD (gastroesophageal reflux disease)    HLD (hyperlipidemia)    Hypertension associated with diabetes ([place]) 02/29/2016   Rosacea 01/28/2019   Type 1 diabetes ([place])     Surgical History: Past Surgical History:  Procedure Laterality Date   broken nose     CHOLECYSTECTOMY     CYSTOSCOPY W/ RETROGRADES Bilateral 02/15/2022   Procedure: CYSTOSCOPY WITH RETROGRADE PYELOGRAM;  Surgeon: Cleon Gustin, MD;  Location: [facility];  Service: Urology;  Laterality: Bilateral;  pt knows to arrive at 8:00   [place]     fracture   TRANSURETHRAL RESECTION OF BLADDER TUMOR N/A 02/15/2022   Procedure: TRANSURETHRAL RESECTION OF BLADDER TUMOR (TURBT);  Surgeon: Cleon Gustin, MD;  Location: [facility];  Service: Urology;  Laterality: N/A;   TRANSURETHRAL RESECTION OF BLADDER TUMOR N/A 03/22/2022   Procedure: TRANSURETHRAL RESECTION OF BLADDER TUMOR (TURBT);  Surgeon: Cleon Gustin, MD;  Location: [facility];  Service: Urology;  Laterality: N/A;    Home Medications:  Allergies as of 03/28/2022       Reactions   Codeine Nausea And Vomiting        Medication List        Accurate as of March 28, 2022  9:40 AM. If you have any questions, ask your nurse or doctor.          atorvastatin 40 MG tablet Commonly known as: LIPITOR TAKE 1 TABLET BY MOUTH EVERY  DAY   BC HEADACHE POWDER PO Take 1 packet by mouth daily as needed (Back pain).   BD Pen Needle Micro U/F 32G X 6 MM Misc Generic drug: Insulin Pen Needle Use with injection 6 times daily Dx E10.9   enalapril 20 MG tablet Commonly known as: VASOTEC Take 1 tablet (20 mg total) by mouth daily.   FreeStyle Libre 2 Reader [place] Use to test blood sugar up to 6 times daily. DX; E11.9   FreeStyle Libre 2 Sensor Misc Use to test blood sugar up to 6 times daily. DX: E11.9   HYDROcodone-acetaminophen 5-325 MG tablet Commonly known as: Norco Take 1 tablet by mouth every 6 (six) hours as needed for moderate pain.   NovoLOG FlexPen 100 UNIT/ML FlexPen Generic drug: insulin aspart Inject 30 Units into the skin 3 (three) times daily with meals. What changed:  how much to take when to take this additional instructions   omeprazole 20 MG capsule Commonly known as: PRILOSEC TAKE 1 CAPSULE BY MOUTH EVERY DAY   OneTouch Delica Lancets 66A Misc   traMADol 50 MG tablet Commonly known as: ULTRAM Take 2 tablets (100 mg total) by mouth every 12 (twelve) hours as needed. What changed: reasons to take this   Tresiba FlexTouch 200 UNIT/ML FlexTouch Pen Generic drug: insulin degludec INJECT 18 UNITS INTO THE SKIN IN THE MORNING AND AT BEDTIME. What changed:  how much to  take when to take this additional instructions        Allergies:  Allergies  Allergen Reactions   Codeine Nausea And Vomiting    Family History: Family History  Problem Relation Age of Onset   Peptic Ulcer Father    Diabetes Paternal Aunt    Colon cancer Neg Hx    Esophageal cancer Neg Hx    Stomach cancer Neg Hx    Colon polyps Neg Hx     Social History:  reports that he has never smoked. He has never used smokeless tobacco. He reports current alcohol use of about 3.0 - 4.0 standard drinks of alcohol per week. He reports that he does not currently use drugs after having used the following drugs:  Marijuana.  ROS: All other review of systems were reviewed and are negative except what is noted above in HPI  Physical Exam: BP 135/82   Pulse 90   Constitutional:  Alert and oriented, No acute distress. HEENT:  AT, moist mucus membranes.  Trachea midline, no masses. Cardiovascular: No clubbing, cyanosis, or edema. Respiratory: Normal respiratory effort, no increased work of breathing. GI: Abdomen is soft, nontender, nondistended, no abdominal masses GU: No CVA tenderness.  Lymph: No cervical or inguinal lymphadenopathy. Skin: No rashes, bruises or suspicious lesions. Neurologic: Grossly intact, no focal deficits, moving all 4 extremities. Psychiatric: Normal mood and affect.  Laboratory Data: Lab Results  Component Value Date   WBC 8.2 01/30/2022   HGB 14.7 01/30/2022   HCT 42.5 01/30/2022   MCV 93 01/30/2022   PLT 187 01/30/2022    Lab Results  Component Value Date   CREATININE 0.71 (L) 01/30/2022    No results found for: "PSA"  No results found for: "TESTOSTERONE"  Lab Results  Component Value Date   HGBA1C 6.7 (H) 01/30/2022    Urinalysis    Component Value Date/Time   APPEARANCEUR Clear 02/05/2022 1339   GLUCOSEU Negative 02/05/2022 1339   BILIRUBINUR Negative 02/05/2022 1339   PROTEINUR Negative 02/05/2022 1339   NITRITE Negative 02/05/2022 1339   LEUKOCYTESUR Negative 02/05/2022 1339    Lab Results  Component Value Date   LABMICR Comment 02/05/2022   WBCUA None seen 12/13/2021   LABEPIT None seen 12/13/2021   MUCUS Present 12/13/2021   BACTERIA Many (A) 12/13/2021    Pertinent Imaging:  No results found for this or any previous visit.  No results found for this or any previous visit.  No results found for this or any previous visit.  No results found for this or any previous visit.  No results found for this or any previous visit.  No valid procedures specified. Results for orders placed in visit on 12/13/21  CT HEMATURIA  WORKUP  Narrative CLINICAL DATA:  Gross hematuria for 3 weeks. Chronic right-sided abdominal pain.  EXAM: CT ABDOMEN AND PELVIS WITHOUT AND WITH CONTRAST  TECHNIQUE: Multidetector CT imaging of the abdomen and pelvis was performed following the standard protocol before and following the bolus administration of intravenous contrast.  RADIATION DOSE REDUCTION: This exam was performed according to the departmental dose-optimization program which includes automated exposure control, adjustment of the mA and/or kV according to patient size and/or use of iterative reconstruction technique.  CONTRAST:  157m OMNIPAQUE IOHEXOL 300 MG/ML  SOLN  COMPARISON:  None Available.  FINDINGS: Lower Chest: No acute findings.  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 No hepatic masses identified.  Pancreas:  No mass or inflammatory changes.  Spleen: Within normal limits in size and  appearance.  Adrenals/Urinary Tract: No adrenal masses identified. No evidence of urolithiasis or hydronephrosis. A small benign-appearing cyst is seen in the lower pole of the right kidney (no followup imaging recommended). No renal masses identified. No masses seen involving the collecting systems or ureters. A focal nodular density with probable mild enhancement is seen along the bladder wall in the right anterior base which measures approximately 1.3 cm (see image 87/7). This is suspicious for a small bladder carcinoma.  Stomach/Bowel: No evidence of obstruction, inflammatory process or abnormal fluid collections. Normal appendix visualized. Diverticulosis is seen mainly involving the sigmoid colon, however there is no evidence of diverticulitis.  Vascular/Lymphatic: No pathologically enlarged lymph nodes. No acute vascular findings. Aortic atherosclerotic calcification incidentally noted.  Reproductive:  No mass or other significant abnormality.  Other:  None.  Musculoskeletal:  No  suspicious bone lesions identified.  IMPRESSION: 1.3 cm nodular density with probable mild enhancement along the bladder wall in the right anterior base, suspicious for small bladder carcinoma. Cystoscopy is recommended for further evaluation.  No radiographic evidence of upper urinary tract neoplasm, urolithiasis, or hydronephrosis.  Hepatic cirrhosis. No evidence of hepatic neoplasm.  Colonic diverticulosis, without radiographic evidence of diverticulitis.  Aortic Atherosclerosis (ICD10-I70.0).   Electronically Signed By: Marlaine Hind M.D. On: 01/17/2022 22:08  No results found for this or any previous visit.   Assessment & Plan:    1. 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 RTC 3 months for cystoscopy - Urinalysis, Routine w reflex microscopic  2. Acute cystitis -urine culture -macrobid '100mg'$  BID for 7 days   No follow-ups on file.  [name], MD  [facility] [place]

## 2022-04-01 LAB — URINE CULTURE

## 2022-04-05 ENCOUNTER — Other Ambulatory Visit: Payer: Self-pay | Admitting: Urology

## 2022-04-05 NOTE — Telephone Encounter (Signed)
Return patient call and patient voiced that he is having a little pain with urination and he feels that his UTI is not cleared. Offer patient appt tomorrow to see Dr. Alyson Ingles, patient voiced understanding

## 2022-04-06 ENCOUNTER — Ambulatory Visit: Payer: Medicare Other | Admitting: Urology

## 2022-04-11 ENCOUNTER — Ambulatory Visit (INDEPENDENT_AMBULATORY_CARE_PROVIDER_SITE_OTHER): Payer: Medicare Other | Admitting: Urology

## 2022-04-11 VITALS — BP 114/80 | HR 96

## 2022-04-11 DIAGNOSIS — N3001 Acute cystitis with hematuria: Secondary | ICD-10-CM | POA: Diagnosis not present

## 2022-04-11 DIAGNOSIS — R300 Dysuria: Secondary | ICD-10-CM | POA: Diagnosis not present

## 2022-04-11 NOTE — Progress Notes (Signed)
04/11/2022 2:29 PM   Dustin Graham 05-04-1951 [id number]  Referring provider: Gwenlyn Perking, [place],  [place] [id number]  Dysuria   HPI: Dustin Graham is a 71yo here for evaluation of dysuria. Last week he developed dysuria which has since improved. He has more urinary frequency. He has nocturia 3-4x. No straining to urinate. UA is concerning for infection. No gross hematuria   PMH: Past Medical History:  Diagnosis Date   Arthritis    Bladder cancer ([place]) 02/16/2022   GERD (gastroesophageal reflux disease)    HLD (hyperlipidemia)    Hypertension associated with diabetes ([place]) 02/29/2016   Rosacea 01/28/2019   Type 1 diabetes ([place])     Surgical History: Past Surgical History:  Procedure Laterality Date   broken nose     CHOLECYSTECTOMY     CYSTOSCOPY W/ RETROGRADES Bilateral 02/15/2022   Procedure: CYSTOSCOPY WITH RETROGRADE PYELOGRAM;  Surgeon: Cleon Gustin, MD;  Location: [facility];  Service: Urology;  Laterality: Bilateral;  pt knows to arrive at 8:00   [place]     fracture   TRANSURETHRAL RESECTION OF BLADDER TUMOR N/A 02/15/2022   Procedure: TRANSURETHRAL RESECTION OF BLADDER TUMOR (TURBT);  Surgeon: Cleon Gustin, MD;  Location: [facility];  Service: Urology;  Laterality: N/A;   TRANSURETHRAL RESECTION OF BLADDER TUMOR N/A 03/22/2022   Procedure: TRANSURETHRAL RESECTION OF BLADDER TUMOR (TURBT);  Surgeon: Cleon Gustin, MD;  Location: [facility];  Service: Urology;  Laterality: N/A;    Home Medications:  Allergies as of 04/11/2022       Reactions   Codeine Nausea And Vomiting        Medication List        Accurate as of April 11, 2022  2:29 PM. If you have any questions, ask your nurse or doctor.          atorvastatin 40 MG tablet Commonly known as: LIPITOR TAKE 1 TABLET BY MOUTH EVERY DAY   BC HEADACHE POWDER PO Take 1 packet by mouth daily as needed (Back pain).   BD Pen Needle  Micro U/F 32G X 6 MM Misc Generic drug: Insulin Pen Needle Use with injection 6 times daily Dx E10.9   enalapril 20 MG tablet Commonly known as: VASOTEC Take 1 tablet (20 mg total) by mouth daily.   FreeStyle Libre 2 [place] Use to test blood sugar up to 6 times daily. DX; E11.9   FreeStyle Libre 2 Graham Misc Use to test blood sugar up to 6 times daily. DX: E11.9   HYDROcodone-acetaminophen 5-325 MG tablet Commonly known as: Norco Take 1 tablet by mouth every 6 (six) hours as needed for moderate pain.   nitrofurantoin (macrocrystal-monohydrate) 100 MG capsule Commonly known as: MACROBID Take 1 capsule (100 mg total) by mouth every 12 (twelve) hours.   NovoLOG FlexPen 100 UNIT/ML FlexPen Generic drug: insulin aspart Inject 30 Units into the skin 3 (three) times daily with meals. What changed:  how much to take when to take this additional instructions   omeprazole 20 MG capsule Commonly known as: PRILOSEC TAKE 1 CAPSULE BY MOUTH EVERY DAY   OneTouch Delica Lancets 27C Misc   traMADol 50 MG tablet Commonly known as: ULTRAM Take 2 tablets (100 mg total) by mouth every 12 (twelve) hours as needed. What changed: reasons to take this   Tresiba FlexTouch 200 UNIT/ML FlexTouch Pen Generic drug: insulin degludec INJECT 18 UNITS INTO THE SKIN  IN THE MORNING AND AT BEDTIME. What changed:  how much to take when to take this additional instructions        Allergies:  Allergies  Allergen Reactions   Codeine Nausea And Vomiting    Family History: Family History  Problem Relation Age of Onset   Peptic Ulcer Father    Diabetes Paternal Aunt    Colon cancer Neg Hx    Esophageal cancer Neg Hx    Stomach cancer Neg Hx    Colon polyps Neg Hx     Social History:  reports that he has never smoked. He has never used smokeless tobacco. He reports current alcohol use of about 3.0 - 4.0 standard drinks of alcohol per week. He reports that he does not currently use  drugs after having used the following drugs: Marijuana.  ROS: All other review of systems were reviewed and are negative except what is noted above in HPI  Physical Exam: BP 114/80   Pulse 96   Constitutional:  Alert and oriented, No acute distress. HEENT: [place] AT, moist mucus membranes.  Trachea midline, no masses. Cardiovascular: No clubbing, cyanosis, or edema. Respiratory: Normal respiratory effort, no increased work of breathing. GI: Abdomen is soft, nontender, nondistended, no abdominal masses GU: No CVA tenderness.  Lymph: No cervical or inguinal lymphadenopathy. Skin: No rashes, bruises or suspicious lesions. Neurologic: Grossly intact, no focal deficits, moving all 4 extremities. Psychiatric: Normal mood and affect.  Laboratory Data: Lab Results  Component Value Date   WBC 8.2 01/30/2022   HGB 14.7 01/30/2022   HCT 42.5 01/30/2022   MCV 93 01/30/2022   PLT 187 01/30/2022    Lab Results  Component Value Date   CREATININE 0.71 (L) 01/30/2022    No results found for: "PSA"  No results found for: "TESTOSTERONE"  Lab Results  Component Value Date   HGBA1C 6.7 (H) 01/30/2022    Urinalysis    Component Value Date/Time   APPEARANCEUR Cloudy (A) 03/28/2022 0926   GLUCOSEU Negative 03/28/2022 0926   BILIRUBINUR Negative 03/28/2022 0926   PROTEINUR 3+ (A) 03/28/2022 0926   NITRITE Negative 03/28/2022 0926   LEUKOCYTESUR 1+ (A) 03/28/2022 0926    Lab Results  Component Value Date   LABMICR See below: 03/28/2022   WBCUA >30 (H) 03/28/2022   LABEPIT 0-10 03/28/2022   MUCUS Present (A) 03/28/2022   BACTERIA Many (A) 03/28/2022    Pertinent Imaging:  No results found for this or any previous visit.  No results found for this or any previous visit.  No results found for this or any previous visit.  No results found for this or any previous visit.  No results found for this or any previous visit.  No valid procedures specified. Results for orders  placed in visit on 12/13/21  CT HEMATURIA WORKUP  Narrative CLINICAL DATA:  Gross hematuria for 3 weeks. Chronic right-sided abdominal pain.  EXAM: CT ABDOMEN AND PELVIS WITHOUT AND WITH CONTRAST  TECHNIQUE: Multidetector CT imaging of the abdomen and pelvis was performed following the standard protocol before and following the bolus administration of intravenous contrast.  RADIATION DOSE REDUCTION: This exam was performed according to the departmental dose-optimization program which includes automated exposure control, adjustment of the mA and/or kV according to patient size and/or use of iterative reconstruction technique.  CONTRAST:  144m OMNIPAQUE IOHEXOL 300 MG/ML  SOLN  COMPARISON:  None Available.  FINDINGS: Lower Chest: No acute findings.  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 No hepatic masses identified.  Pancreas:  No mass or inflammatory changes.  Spleen: Within normal limits in size and appearance.  Adrenals/Urinary Tract: No adrenal masses identified. No evidence of urolithiasis or hydronephrosis. A small benign-appearing cyst is seen in the lower pole of the right kidney (no followup imaging recommended). No renal masses identified. No masses seen involving the collecting systems or ureters. A focal nodular density with probable mild enhancement is seen along the bladder wall in the right anterior base which measures approximately 1.3 cm (see image 87/7). This is suspicious for a small bladder carcinoma.  Stomach/Bowel: No evidence of obstruction, inflammatory process or abnormal fluid collections. Normal appendix visualized. Diverticulosis is seen mainly involving the sigmoid colon, however there is no evidence of diverticulitis.  Vascular/Lymphatic: No pathologically enlarged lymph nodes. No acute vascular findings. Aortic atherosclerotic calcification incidentally noted.  Reproductive:  No mass or other significant  abnormality.  Other:  None.  Musculoskeletal:  No suspicious bone lesions identified.  IMPRESSION: 1.3 cm nodular density with probable mild enhancement along the bladder wall in the right anterior base, suspicious for small bladder carcinoma. Cystoscopy is recommended for further evaluation.  No radiographic evidence of upper urinary tract neoplasm, urolithiasis, or hydronephrosis.  Hepatic cirrhosis. No evidence of hepatic neoplasm.  Colonic diverticulosis, without radiographic evidence of diverticulitis.  Aortic Atherosclerosis (ICD10-I70.0).   Electronically Signed By: Marlaine Hind M.D. On: 01/17/2022 22:08  No results found for this or any previous visit.   Assessment & Plan:    1. Acute cystitis with hematuria -urine for culture - Urinalysis, Routine w reflex microscopic  2. Dysuria -AZO prn   No follow-ups on file.  [name], MD  [facility] [place]

## 2022-04-12 LAB — URINALYSIS, ROUTINE W REFLEX MICROSCOPIC
Bilirubin, UA: NEGATIVE
Ketones, UA: NEGATIVE
Nitrite, UA: NEGATIVE
Protein,UA: NEGATIVE
Specific Gravity, UA: 1.025 (ref 1.005–1.030)
Urobilinogen, Ur: 0.2 mg/dL (ref 0.2–1.0)
pH, UA: 5 (ref 5.0–7.5)

## 2022-04-12 LAB — MICROSCOPIC EXAMINATION

## 2022-04-14 LAB — URINE CULTURE

## 2022-04-17 ENCOUNTER — Telehealth: Payer: Self-pay

## 2022-04-17 NOTE — Telephone Encounter (Signed)
Patient called in today about urine culture results. Made patient aware that I will reach out to Dr. Alyson Ingles and someone will reach back out to him regarding the findings. Patient voiced understanding

## 2022-04-18 ENCOUNTER — Other Ambulatory Visit: Payer: Self-pay | Admitting: Family Medicine

## 2022-04-18 DIAGNOSIS — K219 Gastro-esophageal reflux disease without esophagitis: Secondary | ICD-10-CM

## 2022-04-22 ENCOUNTER — Encounter: Payer: Self-pay | Admitting: Urology

## 2022-04-22 NOTE — Patient Instructions (Signed)

## 2022-04-24 ENCOUNTER — Other Ambulatory Visit: Payer: Self-pay | Admitting: Family Medicine

## 2022-04-24 DIAGNOSIS — E1159 Type 2 diabetes mellitus with other circulatory complications: Secondary | ICD-10-CM

## 2022-05-01 ENCOUNTER — Other Ambulatory Visit: Payer: Self-pay | Admitting: Family Medicine

## 2022-05-01 DIAGNOSIS — E1159 Type 2 diabetes mellitus with other circulatory complications: Secondary | ICD-10-CM

## 2022-05-01 NOTE — Telephone Encounter (Signed)
Left detail message making patient aware that his urine culture was negative.

## 2022-05-02 ENCOUNTER — Ambulatory Visit (INDEPENDENT_AMBULATORY_CARE_PROVIDER_SITE_OTHER): Payer: Medicare Other | Admitting: Family Medicine

## 2022-05-02 ENCOUNTER — Encounter: Payer: Self-pay | Admitting: Family Medicine

## 2022-05-02 VITALS — BP 129/72 | HR 86 | Temp 98.3°F | Ht 72.0 in | Wt 198.1 lb

## 2022-05-02 DIAGNOSIS — K219 Gastro-esophageal reflux disease without esophagitis: Secondary | ICD-10-CM

## 2022-05-02 DIAGNOSIS — M47812 Spondylosis without myelopathy or radiculopathy, cervical region: Secondary | ICD-10-CM | POA: Diagnosis not present

## 2022-05-02 DIAGNOSIS — K709 Alcoholic liver disease, unspecified: Secondary | ICD-10-CM

## 2022-05-02 DIAGNOSIS — E1065 Type 1 diabetes mellitus with hyperglycemia: Secondary | ICD-10-CM | POA: Diagnosis not present

## 2022-05-02 DIAGNOSIS — Z79899 Other long term (current) drug therapy: Secondary | ICD-10-CM

## 2022-05-02 DIAGNOSIS — E1159 Type 2 diabetes mellitus with other circulatory complications: Secondary | ICD-10-CM | POA: Diagnosis not present

## 2022-05-02 DIAGNOSIS — M47816 Spondylosis without myelopathy or radiculopathy, lumbar region: Secondary | ICD-10-CM

## 2022-05-02 DIAGNOSIS — I152 Hypertension secondary to endocrine disorders: Secondary | ICD-10-CM

## 2022-05-02 LAB — BAYER DCA HB A1C WAIVED: HB A1C (BAYER DCA - WAIVED): 7.1 % — ABNORMAL HIGH (ref 4.8–5.6)

## 2022-05-02 MED ORDER — TRAMADOL HCL 50 MG PO TABS: 100.0000 mg | ORAL_TABLET | Freq: Two times a day (BID) | ORAL | 2 refills | Status: DC | PRN

## 2022-05-02 NOTE — Progress Notes (Signed)
Established Patient Office Visit  Subjective   Patient ID: Dustin Graham, male    DOB: 12-17-1950  Age: 71 y.o. MRN: [id number]  Chief Complaint  Patient presents with   Medical Management of Chronic Issues   Diabetes    HPI DM He was diagnosed by endocrinology as Type 1 DM in 20-30 years ago in [facility]. He has been managed for the last few years by his previous PCP her at this office. He reports that he has been well controlled until recently. Recently his blood sugars have been up and down. He has been having low blood sugars daily. He recently ended up in the ED due to hypoglycemia. He has libre 2. He wears this daily. He has fasting blood sugars around 200. His preprandial are around 100-120. He is taking tresiba 22 in the morning and 20 at nights. He takes novolog TID with meals- typically between 22-25. He is symptomatic with hypoglycemia. He denies polyuria, polydipsia, foot ulcerations, weight loss, confusion, nausea, or vomiting.    2. HTN Complaint with meds - Yes Current Medications - enalapril 20 mg daily Pertinent ROS:  Visual Disturbances - No Chest pain - No Dyspnea - No Palpitations - No LE edema - No   3. Pain Cause of pain- history of a kyphoplasty at T12 due to a compression fracture.    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 Mild bilateral bony foraminal narrowing at those levels.     Pain location- back, neck pain Pain on scale of 1-10- 7/10 without medication and 4-5/10 with medication Frequency- daily What increases pain- pain is constant What makes pain better- medication Effects on ADL- makes it slower Any change in general medical condition- no Current opioids rx- Tramadol # meds rx- 60 Effectiveness of current meds- very good Adverse  reactions from pain meds- none Morphine equivalent- 12 MME/day   Pill count performed-No Last drug screen - 10/19/21 ( high risk q66m moderate risk q665mlow risk yearly ) Urine drug screen today- Yes Was the NCPeach [place]- Yes             If yes were their any concerning findings? - No   Overdose risk: 190       10/19/2021    3:49 PM  Opioid Risk   Alcohol 3  Illegal Drugs 0  Rx Drugs 0  Alcohol 0  Illegal Drugs 0  Rx Drugs 0  Age between 16-45 years  0  History of Preadolescent Sexual Abuse 0  Psychological Disease 0  Depression 0  Opioid Risk Tool Scoring 3  Opioid Risk Interpretation Low Risk   3. Constipation He reports intermittent constipation. He will have a regular daily BM and then an episode of constipation for 2-3 days. This occurs about every 2 weeks. He takes milk of magnesia when this occurs. He then will typically have a BM in 1-2 days. He drinks 32 ounces of water a day. He does not eat a lot of fiber.   4. GERD On prilosec. Reports well controlled. No red flag symptoms.   Past Medical History:  Diagnosis Date   Arthritis    [place]   GERD (gastroesophageal reflux disease)    HLD (hyperlipidemia)    Hy[place] associated with [place]   Rosacea 01/28/2019  Type 1 diabetes ([facility])       ROS As per HPI.   Objective:     BP 129/72   Pulse 86   Temp 98.3 F (36.8 C) (Temporal)   Ht 6' (1.829 m)   Wt 198 lb 2 oz (89.9 kg)   SpO2 97%   BMI 26.87 kg/m  Wt Readings from Last 3 Encounters:  05/02/22 198 lb 2 oz (89.9 kg)  03/26/22 208 lb (94.3 kg)  03/22/22 200 lb 9.9 oz (91 kg)      Physical Exam Vitals and nursing note reviewed.  Constitutional:      General: He is not in acute distress.    Appearance: He is not ill-appearing, toxic-appearing or diaphoretic.  HENT:     Head: Normocephalic and atraumatic.  Eyes:     General: No scleral icterus.       Right eye: No discharge.        Left  eye: No discharge.     Extraocular Movements: Extraocular movements intact.     Pupils: Pupils are equal, round, and reactive to light.  Neck:     Thyroid: No thyroid mass, thyromegaly or thyroid tenderness.     Vascular: No carotid bruit.  Cardiovascular:     Rate and Rhythm: Normal rate and regular rhythm.     Heart sounds: Normal heart sounds. No murmur heard. Pulmonary:     Effort: Pulmonary effort is normal.     Breath sounds: Normal breath sounds.  Abdominal:     General: Bowel sounds are normal. There is no distension.     Tenderness: There is no abdominal tenderness. There is no guarding or rebound.  Musculoskeletal:     Cervical back: No rigidity.     Right lower leg: No edema.     Left lower leg: No edema.  Skin:    General: Skin is warm and dry.     Coloration: Skin is not jaundiced.  Neurological:     General: No focal deficit present.     Mental Status: He is alert and oriented to person, place, and time.  Psychiatric:        Mood and Affect: Mood normal.        Behavior: Behavior normal.        Thought Content: Thought content normal.        Judgment: Judgment normal.      No results found for any visits on 05/02/22.    The 10-year ASCVD risk score (Arnett DK, et al., 2019) is: 29.2%    Assessment & Plan:   Dustin Graham was seen today for medical management of chronic issues and diabetes.  Diagnoses and all orders for this visit:  Type 1 diabetes mellitus with hyperglycemia ([place]) Uncontrolled. A1c is 7.1 today, not at goal of <7. Discussed that I do not manage type 1 diabetes and have referred him to endocrinology for management. I am hesitant to adjust insulin regimen today as he has had daily hypoglycemia recently.  -     Bayer DCA Hb A1c Waived -     Ambulatory referral to Endocrinology  Hypertension associated with diabetes ([place]) Well controlled on current regimen. Continue enalapril.  -     CMP14+EGFR  Gastroesophageal reflux disease, unspecified  whether esophagitis present Well controlled on current regimen. Continue prilosec.  -     CMP14+EGFR  Cervical spondylosis Spondylosis of lumbar spine Controlled substance agreement signed PDMP reviewed, no red flags. UDS is UTD. Signed CSA today. Refills  provided. Well controlled on current regimen.  -     traMADol (ULTRAM) 50 MG tablet; Take 2 tablets (100 mg total) by mouth every 12 (twelve) hours as needed.  Alcoholic liver disease ([place]) Managed by GI.  -     CMP14+EGFR   Return in about 3 months (around 08/01/2022) for chronic follow up.   The patient indicates understanding of these issues and agrees with the plan.  Gwenlyn Perking, FNP

## 2022-05-03 LAB — CMP14+EGFR
ALT: 21 IU/L (ref 0–44)
AST: 29 IU/L (ref 0–40)
Albumin/Globulin Ratio: 1.8 (ref 1.2–2.2)
Albumin: 4.2 g/dL (ref 3.9–4.9)
Alkaline Phosphatase: 87 IU/L (ref 44–121)
BUN/Creatinine Ratio: 8 — ABNORMAL LOW (ref 10–24)
BUN: 6 mg/dL — ABNORMAL LOW (ref 8–27)
Bilirubin Total: 1 mg/dL (ref 0.0–1.2)
CO2: 25 mmol/L (ref 20–29)
Calcium: 9.5 mg/dL (ref 8.6–10.2)
Chloride: 97 mmol/L (ref 96–106)
Creatinine, Ser: 0.71 mg/dL — ABNORMAL LOW (ref 0.76–1.27)
Globulin, Total: 2.4 g/dL (ref 1.5–4.5)
Glucose: 175 mg/dL — ABNORMAL HIGH (ref 70–99)
Potassium: 4.8 mmol/L (ref 3.5–5.2)
Sodium: 135 mmol/L (ref 134–144)
Total Protein: 6.6 g/dL (ref 6.0–8.5)
eGFR: 99 mL/min/{1.73_m2} (ref >59)

## 2022-05-15 ENCOUNTER — Other Ambulatory Visit: Payer: Self-pay | Admitting: Family Medicine

## 2022-05-15 DIAGNOSIS — E1159 Type 2 diabetes mellitus with other circulatory complications: Secondary | ICD-10-CM

## 2022-06-05 ENCOUNTER — Other Ambulatory Visit: Payer: Self-pay | Admitting: Family Medicine

## 2022-06-05 DIAGNOSIS — E1159 Type 2 diabetes mellitus with other circulatory complications: Secondary | ICD-10-CM

## 2022-06-09 ENCOUNTER — Other Ambulatory Visit: Payer: Self-pay | Admitting: Family Medicine

## 2022-06-09 DIAGNOSIS — E1159 Type 2 diabetes mellitus with other circulatory complications: Secondary | ICD-10-CM

## 2022-06-14 ENCOUNTER — Other Ambulatory Visit: Payer: Self-pay | Admitting: Family Medicine

## 2022-06-14 DIAGNOSIS — I152 Hypertension secondary to endocrine disorders: Secondary | ICD-10-CM

## 2022-06-14 DIAGNOSIS — E1159 Type 2 diabetes mellitus with other circulatory complications: Secondary | ICD-10-CM

## 2022-06-25 ENCOUNTER — Other Ambulatory Visit: Payer: Self-pay | Admitting: Family Medicine

## 2022-06-25 DIAGNOSIS — E1159 Type 2 diabetes mellitus with other circulatory complications: Secondary | ICD-10-CM

## 2022-06-25 DIAGNOSIS — E782 Mixed hyperlipidemia: Secondary | ICD-10-CM

## 2022-06-28 ENCOUNTER — Other Ambulatory Visit: Payer: Self-pay | Admitting: Family Medicine

## 2022-06-28 DIAGNOSIS — E1159 Type 2 diabetes mellitus with other circulatory complications: Secondary | ICD-10-CM

## 2022-07-04 ENCOUNTER — Other Ambulatory Visit: Payer: Medicare Other | Admitting: Urology

## 2022-07-09 ENCOUNTER — Ambulatory Visit (INDEPENDENT_AMBULATORY_CARE_PROVIDER_SITE_OTHER): Payer: Medicare Other | Admitting: Urology

## 2022-07-09 VITALS — BP 142/87 | HR 90

## 2022-07-09 DIAGNOSIS — Z8551 Personal history of malignant neoplasm of bladder: Secondary | ICD-10-CM | POA: Diagnosis not present

## 2022-07-09 DIAGNOSIS — C671 Malignant neoplasm of dome of bladder: Secondary | ICD-10-CM

## 2022-07-09 LAB — URINALYSIS, ROUTINE W REFLEX MICROSCOPIC
Bilirubin, UA: NEGATIVE
Ketones, UA: NEGATIVE
Leukocytes,UA: NEGATIVE
Nitrite, UA: NEGATIVE
Protein,UA: NEGATIVE
RBC, UA: NEGATIVE
Specific Gravity, UA: 1.03 (ref 1.005–1.030)
Urobilinogen, Ur: 0.2 mg/dL (ref 0.2–1.0)
pH, UA: 5 (ref 5.0–7.5)

## 2022-07-09 MED ORDER — CIPROFLOXACIN HCL 500 MG PO TABS
500.0000 mg | ORAL_TABLET | Freq: Once | ORAL | Status: AC
  Administered 2022-07-09: 500 mg via ORAL

## 2022-07-09 NOTE — Progress Notes (Unsigned)
   07/09/22  CC: followup bladder cancer   HPI: Dustin Graham is a 72yo here for followup for high grade bladder cancer  Blood pressure (!) 142/87, pulse 90. NED. A&Ox3.   No respiratory distress   Abd soft, NT, ND Normal phallus with bilateral descended testicles  Cystoscopy Procedure Note  Patient identification was confirmed, informed consent was obtained, and patient was prepped using Betadine solution.  Lidocaine jelly was administered per urethral meatus.     Pre-Procedure: - Inspection reveals a normal caliber ureteral meatus.  Procedure: The flexible cystoscope was introduced without difficulty - No urethral strictures/lesions are present. - {Blank multiple:19197::"Enlarged","Surgically absent","Normal"} prostate *** - {Blank multiple:19197::"Normal","Elevated","Tight"} bladder neck - Bilateral ureteral orifices identified - Bladder mucosa  reveals no ulcers, tumors, or lesions - No bladder stones - No trabeculation  Retroflexion shows ***   Post-Procedure: - Patient tolerated the procedure well  Assessment/ Plan:   No follow-ups on file.  [name], MD

## 2022-07-09 NOTE — Patient Instructions (Signed)

## 2022-07-16 ENCOUNTER — Encounter: Payer: Self-pay | Admitting: Family Medicine

## 2022-07-16 ENCOUNTER — Ambulatory Visit (INDEPENDENT_AMBULATORY_CARE_PROVIDER_SITE_OTHER): Payer: Medicare Other | Admitting: Family Medicine

## 2022-07-16 ENCOUNTER — Ambulatory Visit (INDEPENDENT_AMBULATORY_CARE_PROVIDER_SITE_OTHER): Payer: Medicare Other

## 2022-07-16 VITALS — BP 122/73 | HR 98 | Temp 98.0°F | Ht 72.0 in | Wt 194.2 lb

## 2022-07-16 DIAGNOSIS — W19XXXA Unspecified fall, initial encounter: Secondary | ICD-10-CM | POA: Diagnosis not present

## 2022-07-16 DIAGNOSIS — M79644 Pain in right finger(s): Secondary | ICD-10-CM | POA: Diagnosis not present

## 2022-07-16 NOTE — Patient Instructions (Signed)
Finger Sprain, Adult A finger sprain is a tear or stretch in a ligament in a finger. Ligaments are tissues that connect bones to each other. What are the causes? Finger sprains happen when something makes the bones in the hand move in an abnormal way. They are often caused by a fall or an accident. What increases the risk? This condition is more likely to develop in people who: Participate in sports in which it is easy to fall, such as skiing. Play sports that involve catching an object, such as basketball. Have poor strength and flexibility. What are the signs or symptoms? Symptoms of this condition include: Pain or tenderness in the finger. Swelling in the finger. A bluish appearance to the finger. Bruising. Difficulty bending and flexing the finger. How is this diagnosed? This condition is diagnosed with an exam of your finger. Your health care provider may take an X-ray to see if any bones are broken or dislocated. How is this treated? Treatment for this condition depends on how severe the sprain is. It may involve: Preventing the finger from moving for a period of time. Your finger may be wrapped in a bandage (dressing) or splint, or your finger may be taped to the fingers beside it (buddy taping). Medicines for pain. Exercises to strengthen the finger. These may be recommended when the finger has healed. Surgery to reconnect the ligament to a bone. This may be done if the ligament was completely torn. Follow these instructions at home: If you have a removable splint: Wear the splint as told by your health care provider. Remove it only as told by your health care provider. Check the skin around the splint every day. Tell your health care provider about any concerns. Loosen the splint if your fingers tingle, become numb, or turn cold and blue. Keep the splint clean. If the splint is not waterproof: Do not let it get wet. Cover it with a watertight covering when you take a bath or  shower. Managing pain, stiffness, and swelling If directed, put ice on the injured area. To do this: If you have a removable splint, remove it as told by your health care provider. Put ice in a plastic bag. Place a towel between your skin and the bag. Leave the ice on for 20 minutes, 2-3 times a day. Remove the ice if your skin turns bright red. This is very important. If you cannot feel pain, heat, or cold, you have a greater risk of damage to the area. Move your fingers often to reduce stiffness and swelling. Raise (elevate) the injured area above the level of your heart while you are sitting or lying down. Medicines Take over-the-counter and prescription medicines only as told by your health care provider. Ask your health care provider if the medicine prescribed to you requires you to avoid driving or using machinery. General instructions Keep any dressings dry until your health care provider says they can be removed. If your fingers are buddy taped, replace your buddy taping as told by your health care provider. Do exercises as told by your health care provider or physical therapist. Do not wear rings on your injured finger. Keep all follow-up visits. This is important. Contact a health care provider if: Your pain is not controlled with medicine. Your bruising or swelling gets worse. Your splint is damaged. You develop a fever. Get help right away if: Your finger is numb or blue. Your finger feels colder to the touch than normal. Summary A finger sprain  is a tear or stretch in a ligament in a finger. Ligaments are tissues that connect bones to each other. Finger sprains happen when something makes the bones in the hand move in an abnormal way. They are often caused by a fall or accident. This condition is diagnosed with an exam of your finger. Your health care provider may do an X-ray to see if any bones are broken or dislocated. Treatment for this condition depends on how severe  the sprain is. Treatment may involve buddy taping or wearing a splint. Surgery to reconnect the ligament to a bone may be needed if the ligament was torn all the way. This information is not intended to replace advice given to you by your health care provider. Make sure you discuss any questions you have with your health care provider. Document Revised: 04/06/2020 Document Reviewed: 04/06/2020 Elsevier Patient Education  [place].

## 2022-07-16 NOTE — Progress Notes (Signed)
   Acute Office Visit  Subjective:     Patient ID: Dustin Graham, male    DOB: 11/11/1950, 72 y.o.   MRN: [id number]  Chief Complaint  Patient presents with   Hand Pain    Hand Pain  The incident occurred 12 to 24 hours ago. The incident occurred at home. The injury mechanism was a fall. Pain location: right little finger. The quality of the pain is described as aching. The pain does not radiate. The pain has been Constant since the incident. Pertinent negatives include no chest pain, muscle weakness, numbness or tingling. The symptoms are aggravated by movement and palpation. He has tried acetaminophen, immobilization and rest for the symptoms. The treatment provided no relief.     ROS As per HPI.       Objective:    BP 122/73   Pulse 98   Temp 98 F (36.7 C) (Temporal)   Ht 6' (1.829 m)   Wt 194 lb 4 oz (88.1 kg)   SpO2 96%   BMI 26.35 kg/m    Physical Exam Vitals and nursing note reviewed.  Pulmonary:     Effort: Pulmonary effort is normal. No respiratory distress.  Musculoskeletal:     Comments: Right little finger: mild swelling to DIP joint with tenderness and mild erythema. Decreased ROM of DIP joint. Brisk cap refill. Sensation intact.   Skin:    General: Skin is warm and dry.  Neurological:     General: No focal deficit present.     Mental Status: He is oriented to person, place, and time.  Psychiatric:        Mood and Affect: Mood normal.        Behavior: Behavior normal.     No results found for any visits on 07/16/22.      Assessment & Plan:   [name] was seen today for hand pain.  Diagnoses and all orders for this visit:  Fall, initial encounter Pain of finger of right hand Xray with small bone fragment on xray- likely old fracture per radiology report. Discussed with patient. Finger buddy taped today. Discussed RICE therapy. Declined referral today.  -     DG Finger Little Right; Future  Return if symptoms worsen or fail to  improve.  The patient indicates understanding of these issues and agrees with the plan.   Gabriel Earing, FNP

## 2022-07-17 ENCOUNTER — Ambulatory Visit: Payer: Medicare Other

## 2022-07-27 ENCOUNTER — Other Ambulatory Visit: Payer: Self-pay | Admitting: Family Medicine

## 2022-07-27 DIAGNOSIS — K219 Gastro-esophageal reflux disease without esophagitis: Secondary | ICD-10-CM

## 2022-08-10 ENCOUNTER — Encounter: Payer: Self-pay | Admitting: Urology

## 2022-08-11 ENCOUNTER — Other Ambulatory Visit: Payer: Self-pay | Admitting: Gastroenterology

## 2022-08-11 ENCOUNTER — Other Ambulatory Visit: Payer: Self-pay | Admitting: Family Medicine

## 2022-08-11 DIAGNOSIS — E1159 Type 2 diabetes mellitus with other circulatory complications: Secondary | ICD-10-CM

## 2022-08-13 NOTE — Telephone Encounter (Signed)
Appt scheduled for 09/06/22, pt needs refill please send to pharmacy

## 2022-08-13 NOTE — Telephone Encounter (Signed)
Needs appointment before next refill.

## 2022-08-16 ENCOUNTER — Encounter: Payer: Self-pay | Admitting: Family Medicine

## 2022-08-16 ENCOUNTER — Ambulatory Visit (INDEPENDENT_AMBULATORY_CARE_PROVIDER_SITE_OTHER): Payer: Medicare Other | Admitting: Family Medicine

## 2022-08-16 VITALS — BP 119/70 | HR 88 | Temp 97.8°F | Ht 72.0 in | Wt 194.4 lb

## 2022-08-16 DIAGNOSIS — I152 Hypertension secondary to endocrine disorders: Secondary | ICD-10-CM

## 2022-08-16 DIAGNOSIS — E1065 Type 1 diabetes mellitus with hyperglycemia: Secondary | ICD-10-CM

## 2022-08-16 DIAGNOSIS — K219 Gastro-esophageal reflux disease without esophagitis: Secondary | ICD-10-CM | POA: Diagnosis not present

## 2022-08-16 DIAGNOSIS — M47816 Spondylosis without myelopathy or radiculopathy, lumbar region: Secondary | ICD-10-CM

## 2022-08-16 DIAGNOSIS — M47812 Spondylosis without myelopathy or radiculopathy, cervical region: Secondary | ICD-10-CM

## 2022-08-16 DIAGNOSIS — E1159 Type 2 diabetes mellitus with other circulatory complications: Secondary | ICD-10-CM

## 2022-08-16 DIAGNOSIS — E1165 Type 2 diabetes mellitus with hyperglycemia: Secondary | ICD-10-CM

## 2022-08-16 DIAGNOSIS — Z79899 Other long term (current) drug therapy: Secondary | ICD-10-CM | POA: Insufficient documentation

## 2022-08-16 LAB — BAYER DCA HB A1C WAIVED: HB A1C (BAYER DCA - WAIVED): 6.3 % — ABNORMAL HIGH (ref 4.8–5.6)

## 2022-08-16 MED ORDER — TRAMADOL HCL 50 MG PO TABS: 100.0000 mg | ORAL_TABLET | Freq: Two times a day (BID) | ORAL | 2 refills | Status: DC | PRN

## 2022-08-16 MED ORDER — INSULIN DEGLUDEC FLEXTOUCH 200 UNIT/ML ~~LOC~~ SOPN: PEN_INJECTOR | SUBCUTANEOUS | 1 refills | Status: DC

## 2022-08-16 MED ORDER — BLOOD GLUCOSE TEST VI STRP: 1.0000 | ORAL_STRIP | Freq: Three times a day (TID) | 0 refills | Status: DC

## 2022-08-16 MED ORDER — LANCET DEVICE MISC: 1.0000 | Freq: Three times a day (TID) | 0 refills | Status: AC

## 2022-08-16 MED ORDER — INSULIN ASPART FLEXPEN 100 UNIT/ML ~~LOC~~ SOPN: 30.0000 [IU] | PEN_INJECTOR | Freq: Three times a day (TID) | SUBCUTANEOUS | 1 refills | Status: DC

## 2022-08-16 MED ORDER — BLOOD GLUCOSE MONITORING SUPPL DEVI: 1.0000 | Freq: Three times a day (TID) | 0 refills | Status: AC

## 2022-08-16 MED ORDER — LANCETS MISC. MISC: 1.0000 | Freq: Three times a day (TID) | 0 refills | Status: AC

## 2022-08-16 MED ORDER — OMEPRAZOLE 20 MG PO CPDR: 20.0000 mg | DELAYED_RELEASE_CAPSULE | Freq: Every day | ORAL | 3 refills | Status: DC

## 2022-08-16 NOTE — Progress Notes (Signed)
Established Patient Office Visit  Subjective   Patient ID: Dustin Graham, male    DOB: 11/26/1950  Age: 72 y.o. MRN: [id number]  Chief Complaint  Patient presents with   Diabetes   Medical Management of Chronic Issues    HPI DM He was diagnosed by endocrinology as Type 1 DM in 20-30 years ago in [facility].  He uses a CGM. He is currently taking 24 units of basal insulin in the morning and 20 units at bedtime. He typically uses 20-30 units of novolog with meals. Denies lows. Average has been 159. Denies nausea, vomiting, foot paresthesias, changes in vision, fever, abdominal pain, HA, weight loss. He has not yet scheduled with endocrinology for management.   2. HTN Complaint with meds - Yes Current Medications - enalapril 20 mg daily Pertinent ROS:  Visual Disturbances - No Chest pain - No Dyspnea - No Palpitations - No LE edema - No   3. GERD Compliant with medications - Yes Current medications - prilosec 20 mg  Cough - No Sore throat - No Voice change - No Hemoptysis - No Dysphagia or dyspepsia - No Water brash - No Red Flags (weight loss, hematochezia, melena, weight loss, early satiety, fevers, odynophagia, or persistent vomiting) - No  4. Pain Cause of pain- history of a kyphoplasty at T12 due to a compression fracture.    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 Mild bilateral bony foraminal narrowing at those levels.     Pain location- back, neck pain Pain on scale of 1-10- 7/10 without medication and 4-5/10 with medication Frequency- daily. He does not take tramadol daily however.  What increases pain- pain is constant What makes pain better- medication Effects on ADL- makes it slower Any change in general medical condition- no Current opioids rx-  Tramadol # meds rx- 60 Effectiveness of current meds- very good Adverse reactions from pain meds- none Morphine equivalent- 12 MME/day   Pill count performed-No Last drug screen - 10/19/21 ( high risk q22m, moderate risk q25m, low risk yearly ) Urine drug screen today- Yes Was the [place] reviewed- Yes             If yes were their any concerning findings? - No   Overdose risk: 190       10/19/2021    3:49 PM  Opioid Risk   Alcohol 3  Illegal Drugs 0  Rx Drugs 0  Alcohol 0  Illegal Drugs 0  Rx Drugs 0  Age between 16-45 years  0  History of Preadolescent Sexual Abuse 0  Psychological Disease 0  Depression 0  Opioid Risk Tool Scoring 3  Opioid Risk Interpretation Low Risk     Past Medical History:  Diagnosis Date   Arthritis    Bladder cancer ([place]) 02/16/2022   GERD (gastroesophageal reflux disease)    HLD (hyperlipidemia)    Hypertension associated with diabetes ([place]) 02/29/2016   Rosacea 01/28/2019   Type 1 diabetes ([facility])       ROS As per HPI.   Objective:     BP 119/70   Pulse 88   Temp 97.8 F (36.6 C) (Temporal)   Ht 6' (1.829 m)   Wt 194 lb 6 oz (88.2 kg)   SpO2 96%   BMI 26.36 kg/m  Wt Readings from Last  3 Encounters:  08/16/22 194 lb 6 oz (88.2 kg)  07/16/22 194 lb 4 oz (88.1 kg)  05/02/22 198 lb 2 oz (89.9 kg)    Physical Exam Vitals and nursing note reviewed.  Constitutional:      General: He is not in acute distress.    Appearance: He is not ill-appearing, toxic-appearing or diaphoretic.  HENT:     Head: Normocephalic and atraumatic.  Eyes:     General: No scleral icterus.       Right eye: No discharge.        Left eye: No discharge.     Extraocular Movements: Extraocular movements intact.     Pupils: Pupils are equal, round, and reactive to light.  Neck:     Thyroid: No thyroid mass, thyromegaly or thyroid tenderness.     Vascular: No carotid bruit.  Cardiovascular:     Rate and Rhythm: Normal rate and regular rhythm.     Heart  sounds: Normal heart sounds. No murmur heard. Pulmonary:     Effort: Pulmonary effort is normal.     Breath sounds: Normal breath sounds.  Abdominal:     General: Bowel sounds are normal. There is no distension.     Tenderness: There is no abdominal tenderness. There is no guarding or rebound.  Musculoskeletal:     Cervical back: No rigidity.     Right lower leg: No edema.     Left lower leg: No edema.  Skin:    General: Skin is warm and dry.     Coloration: Skin is not jaundiced.  Neurological:     General: No focal deficit present.     Mental Status: He is alert and oriented to person, place, and time.  Psychiatric:        Mood and Affect: Mood normal.        Behavior: Behavior normal.        Thought Content: Thought content normal.        Judgment: Judgment normal.      No results found for any visits on 08/16/22.    The 10-year ASCVD risk score (Arnett DK, et al., 2019) is: 27.9%    Assessment & Plan:   Kairan was seen today for diabetes and medical management of chronic issues.  Diagnoses and all orders for this visit:  Type 1 diabetes mellitus with hyperglycemia ([place]) New referral placed to endo from management of T1DM. Discussed again with patient today that I do not management T1DM and this is the reason for the referral. Glucometer ordered today as he does not have one. Continue CGM. Continue current insulin regimen as A1c is well controlled today.  -     Bayer DCA Hb A1c Waived -     Blood Glucose Monitoring Suppl DEVI; 1 each by Does not apply route in the morning, at noon, and at bedtime. May substitute to any manufacturer covered by patient's insurance. -     Glucose Blood (BLOOD GLUCOSE TEST STRIPS) STRP; 1 each by In Vitro route in the morning, at noon, and at bedtime. May substitute to any manufacturer covered by patient's insurance. -     Lancet Device MISC; 1 each by Does not apply route in the morning, at noon, and at bedtime. May substitute to any  manufacturer covered by patient's insurance. -     Lancets Misc. MISC; 1 each by Does not apply route in the morning, at noon, and at bedtime. May substitute to any manufacturer covered by  patient's insurance. -     Insulin Aspart FlexPen (NOVOLOG) 100 UNIT/ML; Inject 30 Units into the skin 3 (three) times daily with meals. -     Insulin Degludec FlexTouch 200 UNIT/ML SOPN; INJECT 24 UNITS INTO THE SKIN IN THE MORNING AND 20 AT BEDTIME. -     Ambulatory referral to Endocrinology  Hypertension associated with diabetes ([place]) Well controlled on current regimen.   Gastroesophageal reflux disease, unspecified whether esophagitis present Well controlled on current regimen.  -     omeprazole (PRILOSEC) 20 MG capsule; Take 1 capsule (20 mg total) by mouth daily.  Cervical spondylosis Spondylosis of lumbar spine Controlled substance agreement signed PDMP reviewed, no red flags. UDS is UTD. Signed CSA today. Refills provided. Well controlled on current regimen.  -     traMADol (ULTRAM) 50 MG tablet; Take 2 tablets (100 mg total) by mouth every 12 (twelve) hours as needed.   Return in about 3 months (around 11/16/2022) for medication follow up.   The patient indicates understanding of these issues and agrees with the plan.  Gwenlyn Perking, FNP

## 2022-09-03 ENCOUNTER — Encounter: Payer: Self-pay | Admitting: "Endocrinology

## 2022-09-06 ENCOUNTER — Ambulatory Visit: Payer: Medicare Other | Admitting: Family Medicine

## 2022-09-07 ENCOUNTER — Ambulatory Visit (INDEPENDENT_AMBULATORY_CARE_PROVIDER_SITE_OTHER): Payer: Medicare Other

## 2022-09-07 VITALS — Ht 72.0 in | Wt 204.0 lb

## 2022-09-07 DIAGNOSIS — Z0000 Encounter for general adult medical examination without abnormal findings: Secondary | ICD-10-CM

## 2022-09-07 NOTE — Patient Instructions (Signed)
Mr. Dustin Graham , Thank you for taking time to come for your Medicare Wellness Visit. I appreciate your ongoing commitment to your health goals. Please review the following plan we discussed and let me know if I can assist you in the future.   These are the goals we discussed:  Goals      DIET - EAT MORE FRUITS AND VEGETABLES     Exercise 150 min/wk Moderate Activity     LIFESTYLE - DECREASE FALLS RISK     Since back surgery, having some mobility issues - plans to get shower chair, elevated toilet seat and grab bars in bathroom.        This is a list of the screening recommended for you and due dates:  Health Maintenance  Topic Date Due   DTaP/Tdap/Td vaccine (1 - Tdap) Never done   Eye exam for diabetics  06/09/2020   Yearly kidney health urinalysis for diabetes  09/22/2022   Cologuard (Stool DNA test)  05/03/2023*   COVID-19 Vaccine (6 - 2023-24 season) 05/19/2023*   Zoster (Shingles) Vaccine (1 of 2) 11/16/2023*   Complete foot exam   09/22/2022   Flu Shot  12/27/2022   Hemoglobin A1C  02/16/2023   Yearly kidney function blood test for diabetes  05/03/2023   Medicare Annual Wellness Visit  09/07/2023   Pneumonia Vaccine  Completed   Hepatitis C Screening: USPSTF Recommendation to screen - Ages 18-79 yo.  Completed   HPV Vaccine  Aged Out  *Topic was postponed. The date shown is not the original due date.    Advanced directives: Advance directive discussed with you today. I have provided a copy for you to complete at home and have notarized. Once this is complete please bring a copy in to our office so we can scan it into your chart.   Conditions/risks identified: Aim for 30 minutes of exercise or brisk walking, 6-8 glasses of water, and 5 servings of fruits and vegetables each day.   Next appointment: Follow up in one year for your annual wellness visit.   Preventive Care 72 Years and Older, Male  Preventive care refers to lifestyle choices and visits with your health care  provider that can promote health and wellness. What does preventive care include? A yearly physical exam. This is also called an annual well check. Dental exams once or twice a year. Routine eye exams. Ask your health care provider how often you should have your eyes checked. Personal lifestyle choices, including: Daily care of your teeth and gums. Regular physical activity. Eating a healthy diet. Avoiding tobacco and drug use. Limiting alcohol use. Practicing safe sex. Taking low doses of aspirin every day. Taking vitamin and mineral supplements as recommended by your health care provider. What happens during an annual well check? The services and screenings done by your health care provider during your annual well check will depend on your age, overall health, lifestyle risk factors, and family history of disease. Counseling  Your health care provider may ask you questions about your: Alcohol use. Tobacco use. Drug use. Emotional well-being. Home and relationship well-being. Sexual activity. Eating habits. History of falls. Memory and ability to understand (cognition). Work and work Astronomer. Screening  You may have the following tests or measurements: Height, weight, and BMI. Blood pressure. Lipid and cholesterol levels. These may be checked every 5 years, or more frequently if you are over 70 years old. Skin check. Lung cancer screening. You may have this screening every year starting at  age 72 if you have a 30-pack-year history of smoking and currently smoke or have quit within the past 15 years. Fecal occult blood test (FOBT) of the stool. You may have this test every year starting at age 72. Flexible sigmoidoscopy or colonoscopy. You may have a sigmoidoscopy every 5 years or a colonoscopy every 10 years starting at age 72. Prostate cancer screening. Recommendations will vary depending on your family history and other risks. Hepatitis C blood test. Hepatitis B blood  test. Sexually transmitted disease (STD) testing. Diabetes screening. This is done by checking your blood sugar (glucose) after you have not eaten for a while (fasting). You may have this done every 1-3 years. Abdominal aortic aneurysm (AAA) screening. You may need this if you are a current or former smoker. Osteoporosis. You may be screened starting at age 32 if you are at high risk. Talk with your health care provider about your test results, treatment options, and if necessary, the need for more tests. Vaccines  Your health care provider may recommend certain vaccines, such as: Influenza vaccine. This is recommended every year. Tetanus, diphtheria, and acellular pertussis (Tdap, Td) vaccine. You may need a Td booster every 10 years. Zoster vaccine. You may need this after age 107. Pneumococcal 13-valent conjugate (PCV13) vaccine. One dose is recommended after age 28. Pneumococcal polysaccharide (PPSV23) vaccine. One dose is recommended after age 30. Talk to your health care provider about which screenings and vaccines you need and how often you need them. This information is not intended to replace advice given to you by your health care provider. Make sure you discuss any questions you have with your health care provider. Document Released: 06/10/2015 Document Revised: 02/01/2016 Document Reviewed: 03/15/2015 Elsevier Interactive Patient Education  2017 [employer].  Fall Prevention in the Home Falls can cause injuries. They can happen to people of all ages. There are many things you can do to make your home safe and to help prevent falls. What can I do on the outside of my home? Regularly fix the edges of walkways and driveways and fix any cracks. Remove anything that might make you trip as you walk through a door, such as a raised step or threshold. Trim any bushes or trees on the path to your home. Use bright outdoor lighting. Clear any walking paths of anything that might make  someone trip, such as rocks or tools. Regularly check to see if handrails are loose or broken. Make sure that both sides of any steps have handrails. Any raised decks and porches should have guardrails on the edges. Have any leaves, snow, or ice cleared regularly. Use sand or salt on walking paths during winter. Clean up any spills in your garage right away. This includes oil or grease spills. What can I do in the bathroom? Use night lights. Install grab bars by the toilet and in the tub and shower. Do not use towel bars as grab bars. Use non-skid mats or decals in the tub or shower. If you need to sit down in the shower, use a plastic, non-slip stool. Keep the floor dry. Clean up any water that spills on the floor as soon as it happens. Remove soap buildup in the tub or shower regularly. Attach bath mats securely with double-sided non-slip rug tape. Do not have throw rugs and other things on the floor that can make you trip. What can I do in the bedroom? Use night lights. Make sure that you have a light by your  bed that is easy to reach. Do not use any sheets or blankets that are too big for your bed. They should not hang down onto the floor. Have a firm chair that has side arms. You can use this for support while you get dressed. Do not have throw rugs and other things on the floor that can make you trip. What can I do in the kitchen? Clean up any spills right away. Avoid walking on wet floors. Keep items that you use a lot in easy-to-reach places. If you need to reach something above you, use a strong step stool that has a grab bar. Keep electrical cords out of the way. Do not use floor polish or wax that makes floors slippery. If you must use wax, use non-skid floor wax. Do not have throw rugs and other things on the floor that can make you trip. What can I do with my stairs? Do not leave any items on the stairs. Make sure that there are handrails on both sides of the stairs and  use them. Fix handrails that are broken or loose. Make sure that handrails are as long as the stairways. Check any carpeting to make sure that it is firmly attached to the stairs. Fix any carpet that is loose or worn. Avoid having throw rugs at the top or bottom of the stairs. If you do have throw rugs, attach them to the floor with carpet tape. Make sure that you have a light switch at the top of the stairs and the bottom of the stairs. If you do not have them, ask someone to add them for you. What else can I do to help prevent falls? Wear shoes that: Do not have high heels. Have rubber bottoms. Are comfortable and fit you well. Are closed at the toe. Do not wear sandals. If you use a stepladder: Make sure that it is fully opened. Do not climb a closed stepladder. Make sure that both sides of the stepladder are locked into place. Ask someone to hold it for you, if possible. Clearly mark and make sure that you can see: Any grab bars or handrails. First and last steps. Where the edge of each step is. Use tools that help you move around (mobility aids) if they are needed. These include: Canes. Walkers. Scooters. Crutches. Turn on the lights when you go into a dark area. Replace any light bulbs as soon as they burn out. Set up your furniture so you have a clear path. Avoid moving your furniture around. If any of your floors are uneven, fix them. If there are any pets around you, be aware of where they are. Review your medicines with your doctor. Some medicines can make you feel dizzy. This can increase your chance of falling. Ask your doctor what other things that you can do to help prevent falls. This information is not intended to replace advice given to you by your health care provider. Make sure you discuss any questions you have with your health care provider. Document Released: 03/10/2009 Document Revised: 10/20/2015 Document Reviewed: 06/18/2014 Elsevier Interactive Patient  Education  2017 [employer].

## 2022-09-07 NOTE — Progress Notes (Signed)
Subjective:   Dustin Graham is a 72 y.o. male who presents for Medicare Annual/Subsequent preventive examination. I connected with  Robet Leu on 09/07/22 by a audio enabled telemedicine application and verified that I am speaking with the correct person using two identifiers.  Patient Location: Home  Provider Location: Home Office  I discussed the limitations of evaluation and management by telemedicine. The patient expressed understanding and agreed to proceed.  Review of Systems     Cardiac Risk Factors include: advanced age (>32men, >106 women);diabetes mellitus;dyslipidemia;male gender;hypertension     Objective:    Today's Vitals   09/07/22 1356  Weight: 204 lb (92.5 kg)  Height: 6' (1.829 m)   Body mass index is 27.67 kg/m.     09/07/2022    2:00 PM 03/22/2022    6:52 AM 03/19/2022    1:51 PM 02/15/2022    8:39 AM 02/12/2022    1:04 PM 09/05/2021    2:57 PM 09/02/2020    1:58 PM  Advanced Directives  Does Patient Have a Medical Advance Directive? No No No No No No Yes  Type of Tax inspector;Living will  Copy of Healthcare Power of Attorney in Chart?       No - copy requested  Would patient like information on creating a medical advance directive? No - Patient declined No - Patient declined No - Patient declined No - Patient declined No - Patient declined No - Patient declined     Current Medications (verified) Outpatient Encounter Medications as of 09/07/2022  Medication Sig   Aspirin-Salicylamide-Caffeine (BC HEADACHE POWDER PO) Take 1 packet by mouth daily as needed (Back pain).   atorvastatin (LIPITOR) 40 MG tablet TAKE 1 TABLET BY MOUTH EVERY DAY   Blood Glucose Monitoring Suppl DEVI 1 each by Does not apply route in the morning, at noon, and at bedtime. May substitute to any manufacturer covered by patient's insurance.   Continuous Blood Gluc Receiver (FREESTYLE LIBRE 2 READER) DEVI Use to test blood sugar up to 6 times  daily. DX; E11.9   Continuous Blood Gluc Sensor (FREESTYLE LIBRE 2 SENSOR) MISC Use to test blood sugar up to 6 times daily. DX: E11.9   enalapril (VASOTEC) 20 MG tablet TAKE 1 TABLET BY MOUTH EVERY DAY   Glucose Blood (BLOOD GLUCOSE TEST STRIPS) STRP 1 each by In Vitro route in the morning, at noon, and at bedtime. May substitute to any manufacturer covered by patient's insurance.   Insulin Aspart FlexPen (NOVOLOG) 100 UNIT/ML Inject 30 Units into the skin 3 (three) times daily with meals.   Insulin Degludec FlexTouch 200 UNIT/ML SOPN INJECT 24 UNITS INTO THE SKIN IN THE MORNING AND 20 AT BEDTIME.   Insulin Pen Needle (BD PEN NEEDLE MICRO U/F) 32G X 6 MM MISC Use with injection 6 times daily Dx E10.9   Lancet Device MISC 1 each by Does not apply route in the morning, at noon, and at bedtime. May substitute to any manufacturer covered by patient's insurance.   Lancets Misc. MISC 1 each by Does not apply route in the morning, at noon, and at bedtime. May substitute to any manufacturer covered by patient's insurance.   omeprazole (PRILOSEC) 20 MG capsule Take 1 capsule (20 mg total) by mouth daily.   OneTouch Delica Lancets 33G MISC    traMADol (ULTRAM) 50 MG tablet Take 2 tablets (100 mg total) by mouth every 12 (twelve) hours as needed.   No  facility-administered encounter medications on file as of 09/07/2022.    Allergies (verified) Codeine   History: Past Medical History:  Diagnosis Date   Arthritis    Bladder cancer 02/16/2022   GERD (gastroesophageal reflux disease)    HLD (hyperlipidemia)    Hypertension associated with diabetes 02/29/2016   Rosacea 01/28/2019   Type 1 diabetes    Past Surgical History:  Procedure Laterality Date   broken nose     CHOLECYSTECTOMY     CYSTOSCOPY W/ RETROGRADES Bilateral 02/15/2022   Procedure: CYSTOSCOPY WITH RETROGRADE PYELOGRAM;  Surgeon: Malen Gauze, MD;  Location: [facility];  Service: Urology;  Laterality: Bilateral;  pt knows to arrive  at 8:00   GALLBLADDER SURGERY     LUMBAR DISC SURGERY     fracture   TRANSURETHRAL RESECTION OF BLADDER TUMOR N/A 02/15/2022   Procedure: TRANSURETHRAL RESECTION OF BLADDER TUMOR (TURBT);  Surgeon: Malen Gauze, MD;  Location: [facility];  Service: Urology;  Laterality: N/A;   TRANSURETHRAL RESECTION OF BLADDER TUMOR N/A 03/22/2022   Procedure: TRANSURETHRAL RESECTION OF BLADDER TUMOR (TURBT);  Surgeon: Malen Gauze, MD;  Location: [facility];  Service: Urology;  Laterality: N/A;   Family History  Problem Relation Age of Onset   Peptic Ulcer Father    Diabetes Paternal Aunt    Colon cancer Neg Hx    Esophageal cancer Neg Hx    Stomach cancer Neg Hx    Colon polyps Neg Hx    Social History   Socioeconomic History   Marital status: Divorced    Spouse name: Not on file   Number of children: 1   Years of education: 12   Highest education level: Some college, no degree  Occupational History    Employer: [employer]   Occupation: Estate manager/land agent    Comment: [employer]  Tobacco Use   Smoking status: Never   Smokeless tobacco: Never  Vaping Use   Vaping Use: Never used  Substance and Sexual Activity   Alcohol use: Yes    Alcohol/week: 3.0 - 4.0 standard drinks of alcohol    Types: 3 - 4 Standard drinks or equivalent per week    Comment: several beers and a couple of shots per day   Drug use: Not Currently    Types: Marijuana   Sexual activity: Not on file  Other Topics Concern   Not on file  Social History Narrative   Lives alone   Still working full time at [employer]   Daughter lives 20 miles away   Social Determinants of Health   Financial Resource Strain: Low Risk  (09/07/2022)   Overall Financial Resource Strain (CARDIA)    Difficulty of Paying Living Expenses: Not hard at all  Food Insecurity: No Food Insecurity (09/07/2022)   Hunger Vital Sign    Worried About Running Out of Food in the Last Year: Never true    Ran Out of Food in the  Last Year: Never true  Transportation Needs: No Transportation Needs (09/07/2022)   PRAPARE - Administrator, Civil Service (Medical): No    Lack of Transportation (Non-Medical): No  Physical Activity: Insufficiently Active (09/07/2022)   Exercise Vital Sign    Days of Exercise per Week: 3 days    Minutes of Exercise per Session: 30 min  Stress: No Stress Concern Present (09/07/2022)   [employer] of Occupational Health - Occupational Stress Questionnaire    Feeling of Stress : Not at all  Social  Connections: Socially Isolated (09/07/2022)   Social Connection and Isolation Panel [NHANES]    Frequency of Communication with Friends and Family: More than three times a week    Frequency of Social Gatherings with Friends and Family: More than three times a week    Attends Religious Services: Never    Database administrator or Organizations: No    Attends Engineer, structural: Never    Marital Status: Divorced    Tobacco Counseling Counseling given: Not Answered   Clinical Intake:  Pre-visit preparation completed: Yes  Pain : No/denies pain     Nutritional Risks: None Diabetes: Yes CBG done?: No  How often do you need to have someone help you when you read instructions, pamphlets, or other written materials from your doctor or pharmacy?: 1 - Never  Diabetic?yes  Nutrition Risk Assessment:  Has the patient had any N/V/D within the last 2 months?  No  Does the patient have any non-healing wounds?  No  Has the patient had any unintentional weight loss or weight gain?  No   Diabetes:  Is the patient diabetic?  Yes  If diabetic, was a CBG obtained today?  No  Did the patient bring in their glucometer from home?  No  How often do you monitor your CBG's? Libre.   Financial Strains and Diabetes Management:  Are you having any financial strains with the device, your supplies or your medication? No .  Does the patient want to be seen by Chronic Care  Management for management of their diabetes?  No  Would the patient like to be referred to a Nutritionist or for Diabetic Management?  No   Diabetic Exams:  Diabetic Eye Exam: Completed 08/2022 Diabetic Foot Exam: Overdue, Pt has been advised about the importance in completing this exam. Pt is scheduled for diabetic foot exam on next office visit .   Interpreter Needed?: No  Information entered by :: Renie Ora, LPN   Activities of Daily Living    09/07/2022    2:00 PM 03/22/2022    6:41 AM  In your present state of health, do you have any difficulty performing the following activities:  Hearing? 0 0  Vision? 0 0  Difficulty concentrating or making decisions? 0 0  Walking or climbing stairs? 0 0  Dressing or bathing? 0 0  Doing errands, shopping? 0   Preparing Food and eating ? N   Using the Toilet? N   In the past six months, have you accidently leaked urine? N   Do you have problems with loss of bowel control? N   Managing your Medications? N   Managing your Finances? N   Housekeeping or managing your Housekeeping? N     Patient Care Team: Gabriel Earing, FNP as PCP - General (Family Medicine) Danella Maiers, [facility] (Pharmacist) Smitty Cords, OD (Optometry) Jenel Lucks, MD as Consulting Physician (Gastroenterology)  Indicate any recent Medical Services you may have received from other than [facility] providers in the past year (date may be approximate).     Assessment:   This is a routine wellness examination for Demetreus.  Hearing/Vision screen Vision Screening - Comments:: Wears rx glasses - up to date with routine eye exams with  Dr.Johnson   Dietary issues and exercise activities discussed: Current Exercise Habits: Home exercise routine, Type of exercise: walking, Time (Minutes): 30, Frequency (Times/Week): 3, Weekly Exercise (Minutes/Week): 90, Intensity: Mild, Exercise limited by: orthopedic condition(s)   Goals Addressed  This Visit's  Progress    DIET - EAT MORE FRUITS AND VEGETABLES   On track      Depression Screen    09/07/2022    1:59 PM 08/16/2022    9:28 AM 05/02/2022    2:08 PM 01/30/2022   11:20 AM 12/11/2021    2:05 PM 10/19/2021    3:23 PM 09/21/2021    1:00 PM  PHQ 2/9 Scores  PHQ - 2 Score 0 0 2 2  PHQ- 9 Score 0 Fall Risk    09/07/2022    1:58 PM 08/16/2022    9:27 AM 05/02/2022    2:08 PM 01/30/2022   11:20 AM 12/11/2021    2:05 PM  Fall Risk   Falls in the past year? 0 0 0 0 0  Number falls in past yr: 0      Injury with Fall? 0      Risk for fall due to : No Fall Risks      Follow up Falls prevention discussed        FALL RISK PREVENTION PERTAINING TO THE HOME:  Any stairs in or around the home? No  If so, are there any without handrails? No  Home free of loose throw rugs in walkways, pet beds, electrical cords, etc? Yes  Adequate lighting in your home to reduce risk of falls? Yes   ASSISTIVE DEVICES UTILIZED TO PREVENT FALLS:  Life alert? No  Use of a cane, walker or w/c? Yes  Grab bars in the bathroom? Yes  Shower chair or bench in shower? Yes  Elevated toilet seat or a handicapped toilet? Yes          09/07/2022    2:01 PM 09/05/2021    2:58 PM 09/02/2019    2:37 PM  6CIT Screen  What Year? 0 points 0 points 0 points  What month? 0 points 0 points 0 points  What time? 0 points 0 points 0 points  Count back from 20 0 points 0 points 0 points  Months in reverse 0 points 0 points 0 points  Repeat phrase 0 points 2 points 0 points  Total Score 0 points 2 points 0 points    Immunizations Immunization History  Administered Date(s) Administered   Fluad Quad(high Dose 65+) 07/27/2020, 03/22/2021   Influenza Split 03/03/2014   Influenza, High Dose Seasonal PF 01/21/2019   Influenza-Unspecified 01/23/2019   Moderna Sars-Covid-2 Vaccination 07/02/2019, 07/22/2019, 07/31/2019, 02/15/2020, 09/18/2020   Pneumococcal Conjugate-13 09/26/2016   Pneumococcal  Polysaccharide-23 07/23/2018    TDAP status: Due, Education has been provided regarding the importance of this vaccine. Advised may receive this vaccine at local pharmacy or Health Dept. Aware to provide a copy of the vaccination record if obtained from local pharmacy or Health Dept. Verbalized acceptance and understanding.  Flu Vaccine status: Up to date  Pneumococcal vaccine status: Up to date  Covid-19 vaccine status: Completed vaccines  Qualifies for Shingles Vaccine? Yes   Zostavax completed No   Shingrix Completed?: No.    Education has been provided regarding the importance of this vaccine. Patient has been advised to call insurance company to determine out of pocket expense if they have not yet received this vaccine. Advised may also receive vaccine at local pharmacy or Health Dept. Verbalized acceptance and understanding.  Screening Tests Health Maintenance  Topic Date Due   DTaP/Tdap/Td (1 - Tdap) Never done   OPHTHALMOLOGY EXAM  06/09/2020   Diabetic kidney evaluation - Urine ACR  09/22/2022   Fecal DNA (Cologuard)  05/03/2023 (Originally 05/20/1996)   COVID-19 Vaccine (6 - 2023-24 season) 05/19/2023 (Originally 01/26/2022)   Zoster Vaccines- Shingrix (1 of 2) 11/16/2023 (Originally [birth year]/20/1970)   FOOT EXAM  09/22/2022   INFLUENZA VACCINE  12/27/2022   HEMOGLOBIN A1C  02/16/2023   Diabetic kidney evaluation - eGFR measurement  05/03/2023   Medicare Annual Wellness (AWV)  09/07/2023   Pneumonia Vaccine 54+ Years old  Completed   Hepatitis C Screening  Completed   HPV VACCINES  Aged Out    Health Maintenance  Health Maintenance Due  Topic Date Due   DTaP/Tdap/Td (1 - Tdap) Never done   OPHTHALMOLOGY EXAM  06/09/2020   Diabetic kidney evaluation - Urine ACR  09/22/2022    Colorectal cancer screening: Referral to GI placed patient has Cologuard kit . Pt aware the office will call re: appt.  Lung Cancer Screening: (Low Dose CT Chest recommended if Age 68-80 years, 30  pack-year currently smoking OR have quit w/in 15years.) does not qualify.   Lung Cancer Screening Referral: n/a  Additional Screening:  Hepatitis C Screening: does not qualify; Completed 04/05/2021  Vision Screening: Recommended annual ophthalmology exams for early detection of glaucoma and other disorders of the eye. Is the patient up to date with their annual eye exam?  Yes  Who is the provider or what is the name of the office in which the patient attends annual eye exams? Dr.Johnson  If pt is not established with a provider, would they like to be referred to a provider to establish care? No .   Dental Screening: Recommended annual dental exams for proper oral hygiene  Community Resource Referral / Chronic Care Management: CRR required this visit?  No   CCM required this visit?  No      Plan:     I have personally reviewed and noted the following in the patient's chart:   Medical and social history Use of alcohol, tobacco or illicit drugs  Current medications and supplements including opioid prescriptions. Patient is not currently taking opioid prescriptions. Functional ability and status Nutritional status Physical activity Advanced directives List of other physicians Hospitalizations, surgeries, and ER visits in previous 12 months Vitals Screenings to include cognitive, depression, and falls Referrals and appointments  In addition, I have reviewed and discussed with patient certain preventive protocols, quality metrics, and best practice recommendations. A written personalized care plan for preventive services as well as general preventive health recommendations were provided to patient.     Lorrene Reid, LPN   [id number]/[id number]   Nurse Notes: Due Tdap Vaccine

## 2022-09-19 ENCOUNTER — Other Ambulatory Visit: Payer: Self-pay | Admitting: Family Medicine

## 2022-09-19 DIAGNOSIS — E1065 Type 1 diabetes mellitus with hyperglycemia: Secondary | ICD-10-CM

## 2022-09-24 ENCOUNTER — Other Ambulatory Visit: Payer: Self-pay | Admitting: Family Medicine

## 2022-09-24 DIAGNOSIS — E1065 Type 1 diabetes mellitus with hyperglycemia: Secondary | ICD-10-CM

## 2022-09-25 ENCOUNTER — Other Ambulatory Visit: Payer: Self-pay | Admitting: Family Medicine

## 2022-09-25 DIAGNOSIS — E1065 Type 1 diabetes mellitus with hyperglycemia: Secondary | ICD-10-CM

## 2022-09-30 ENCOUNTER — Other Ambulatory Visit: Payer: Self-pay | Admitting: Family Medicine

## 2022-09-30 DIAGNOSIS — I152 Hypertension secondary to endocrine disorders: Secondary | ICD-10-CM

## 2022-09-30 DIAGNOSIS — E1159 Type 2 diabetes mellitus with other circulatory complications: Secondary | ICD-10-CM

## 2022-10-02 NOTE — Patient Instructions (Signed)

## 2022-10-03 ENCOUNTER — Other Ambulatory Visit: Payer: Self-pay | Admitting: Family Medicine

## 2022-10-03 DIAGNOSIS — E1159 Type 2 diabetes mellitus with other circulatory complications: Secondary | ICD-10-CM

## 2022-10-03 DIAGNOSIS — E782 Mixed hyperlipidemia: Secondary | ICD-10-CM

## 2022-10-04 ENCOUNTER — Ambulatory Visit (INDEPENDENT_AMBULATORY_CARE_PROVIDER_SITE_OTHER): Payer: Medicare Other | Admitting: Nurse Practitioner

## 2022-10-04 ENCOUNTER — Encounter: Payer: Self-pay | Admitting: Nurse Practitioner

## 2022-10-04 VITALS — BP 129/83 | HR 83 | Ht 72.0 in | Wt 199.0 lb

## 2022-10-04 DIAGNOSIS — E109 Type 1 diabetes mellitus without complications: Secondary | ICD-10-CM

## 2022-10-04 DIAGNOSIS — E1065 Type 1 diabetes mellitus with hyperglycemia: Secondary | ICD-10-CM | POA: Diagnosis not present

## 2022-10-04 MED ORDER — INSULIN DEGLUDEC FLEXTOUCH 200 UNIT/ML ~~LOC~~ SOPN
20.0000 [IU] | PEN_INJECTOR | Freq: Every evening | SUBCUTANEOUS | 1 refills | Status: DC
Start: 2022-10-04 — End: 2022-12-10

## 2022-10-04 MED ORDER — INSULIN ASPART FLEXPEN 100 UNIT/ML ~~LOC~~ SOPN
6.0000 [IU] | PEN_INJECTOR | Freq: Three times a day (TID) | SUBCUTANEOUS | 1 refills | Status: DC
Start: 2022-10-04 — End: 2022-12-10

## 2022-10-04 NOTE — Progress Notes (Signed)
Endocrinology Consult Note       10/05/2022, 7:01 AM   Subjective:    Patient ID: Dustin Graham, male    DOB: Aug 07, 1950.  Dustin Graham is being seen in consultation for management of currently uncontrolled symptomatic diabetes requested by  Gabriel Earing, FNP.   Past Medical History:  Diagnosis Date   Arthritis    Bladder cancer (HCC) 02/16/2022   GERD (gastroesophageal reflux disease)    HLD (hyperlipidemia)    Hypertension associated with diabetes (HCC) 02/29/2016   Rosacea 01/28/2019   Type 1 diabetes (HCC)     Past Surgical History:  Procedure Laterality Date   broken nose     CHOLECYSTECTOMY     CYSTOSCOPY W/ RETROGRADES Bilateral 02/15/2022   Procedure: CYSTOSCOPY WITH RETROGRADE PYELOGRAM;  Surgeon: Malen Gauze, MD;  Location: [facility];  Service: Urology;  Laterality: Bilateral;  pt knows to arrive at 8:00   GALLBLADDER SURGERY     LUMBAR DISC SURGERY     fracture   TRANSURETHRAL RESECTION OF BLADDER TUMOR N/A 02/15/2022   Procedure: TRANSURETHRAL RESECTION OF BLADDER TUMOR (TURBT);  Surgeon: Malen Gauze, MD;  Location: [facility];  Service: Urology;  Laterality: N/A;   TRANSURETHRAL RESECTION OF BLADDER TUMOR N/A 03/22/2022   Procedure: TRANSURETHRAL RESECTION OF BLADDER TUMOR (TURBT);  Surgeon: Malen Gauze, MD;  Location: [facility];  Service: Urology;  Laterality: N/A;    Social History   Socioeconomic History   Marital status: Divorced    Spouse name: Not on file   Number of children: 1   Years of education: 12   Highest education level: Some college, no degree  Occupational History    Employer: [employer]   Occupation: Estate manager/land agent    Comment: [employer]  Tobacco Use   Smoking status: Never   Smokeless tobacco: Never  Vaping Use   Vaping Use: Never used  Substance and Sexual Activity   Alcohol use: Yes    Alcohol/week: 3.0 - 4.0  standard drinks of alcohol    Types: 3 - 4 Standard drinks or equivalent per week    Comment: several beers and a couple of shots per day   Drug use: Not Currently    Types: Marijuana   Sexual activity: Not on file  Other Topics Concern   Not on file  Social History Narrative   Lives alone   Still working full time at [employer]   Daughter lives 20 miles away   Social Determinants of Health   Financial Resource Strain: Low Risk  (09/07/2022)   Overall Financial Resource Strain (CARDIA)    Difficulty of Paying Living Expenses: Not hard at all  Food Insecurity: No Food Insecurity (09/07/2022)   Hunger Vital Sign    Worried About Running Out of Food in the Last Year: Never true    Ran Out of Food in the Last Year: Never true  Transportation Needs: No Transportation Needs (09/07/2022)   PRAPARE - Administrator, Civil Service (Medical): No    Lack of Transportation (Non-Medical): No  Physical Activity: Insufficiently Active (09/07/2022)   Exercise Vital Sign    Days of Exercise per Week:  3 days    Minutes of Exercise per Session: 30 min  Stress: No Stress Concern Present (09/07/2022)   [employer] of Occupational Health - Occupational Stress Questionnaire    Feeling of Stress : Not at all  Social Connections: Socially Isolated (09/07/2022)   Social Connection and Isolation Panel [NHANES]    Frequency of Communication with Friends and Family: More than three times a week    Frequency of Social Gatherings with Friends and Family: More than three times a week    Attends Religious Services: Never    Database administrator or Organizations: No    Attends Engineer, structural: Never    Marital Status: Divorced    Family History  Problem Relation Age of Onset   Peptic Ulcer Father    Diabetes Paternal Aunt    Colon cancer Neg Hx    Esophageal cancer Neg Hx    Stomach cancer Neg Hx    Colon polyps Neg Hx     Outpatient Encounter Medications as of  10/04/2022  Medication Sig   Accu-Chek Softclix Lancets lancets Check BS 3 times a day Morning, Noon and Bedtime Dx E11.9   Aspirin-Salicylamide-Caffeine (BC HEADACHE POWDER PO) Take 1 packet by mouth daily as needed (Back pain).   atorvastatin (LIPITOR) 40 MG tablet TAKE 1 TABLET BY MOUTH EVERY DAY   Blood Glucose Monitoring Suppl DEVI 1 each by Does not apply route in the morning, at noon, and at bedtime. May substitute to any manufacturer covered by patient's insurance.   Continuous Blood Gluc Receiver (FREESTYLE LIBRE 2 READER) DEVI Use to test blood sugar up to 6 times daily. DX; E11.9   Continuous Blood Gluc Sensor (FREESTYLE LIBRE 2 SENSOR) MISC Use to test blood sugar up to 6 times daily. DX: E11.9   enalapril (VASOTEC) 20 MG tablet TAKE 1 TABLET BY MOUTH EVERY DAY   glucose blood (ACCU-CHEK GUIDE) test strip Check BS 3 times a day Morning, Noon and Bedtime Dx E11.9   Insulin Pen Needle (BD PEN NEEDLE MICRO U/F) 32G X 6 MM MISC Use with injection 6 times daily Dx E10.9   omeprazole (PRILOSEC) 20 MG capsule Take 1 capsule (20 mg total) by mouth daily.   traMADol (ULTRAM) 50 MG tablet Take 2 tablets (100 mg total) by mouth every 12 (twelve) hours as needed.   [DISCONTINUED] Insulin Aspart FlexPen (NOVOLOG) 100 UNIT/ML Inject 30 Units into the skin 3 (three) times daily with meals.   [DISCONTINUED] Insulin Degludec FlexTouch 200 UNIT/ML SOPN INJECT 24 UNITS INTO THE SKIN IN THE MORNING AND 20 AT BEDTIME.   Insulin Aspart FlexPen (NOVOLOG) 100 UNIT/ML Inject 6-12 Units into the skin 3 (three) times daily with meals.   Insulin Degludec FlexTouch 200 UNIT/ML SOPN Inject 20 Units into the skin at bedtime.   [DISCONTINUED] atorvastatin (LIPITOR) 40 MG tablet TAKE 1 TABLET BY MOUTH EVERY DAY   No facility-administered encounter medications on file as of 10/04/2022.    ALLERGIES: Allergies  Allergen Reactions   Codeine Nausea And Vomiting    VACCINATION STATUS: Immunization History   Administered Date(s) Administered   Fluad Quad(high Dose 65+) 07/27/2020, 03/22/2021   Influenza Split 03/03/2014   Influenza, High Dose Seasonal PF 01/21/2019   Influenza-Unspecified 01/23/2019   Moderna Sars-Covid-2 Vaccination 07/02/2019, 07/22/2019, 07/31/2019, 02/15/2020, 09/18/2020   Pneumococcal Conjugate-13 09/26/2016   Pneumococcal Polysaccharide-23 07/23/2018    Diabetes He presents for his initial diabetic visit. He has type 1 diabetes mellitus. Onset time: Diagnosed at  approx age of 68. His disease course has been stable. Hypoglycemia symptoms include nervousness/anxiousness, sweats and tremors. There are no diabetic associated symptoms. There are no hypoglycemic complications. Symptoms are stable. Diabetic complications include heart disease (MI). Risk factors for coronary artery disease include diabetes mellitus, dyslipidemia, family history, male sex, sedentary lifestyle and hypertension. Current diabetic treatment includes intensive insulin program. He is compliant with treatment most of the time (currently on Tresiba 24 units in am and 20 units in pm; Novolog 24-30 units TID). His weight is fluctuating minimally. He is following a generally unhealthy diet. When asked about meal planning, he reported none. He has not had a previous visit with a dietitian. He rarely participates in exercise. His overall blood glucose range is 140-180 mg/dl. (He presents today for his consultation with his CGM showing mostly at goal glycemic profile overall.  His most recent A1c on 08/16/22 was 6.3%. He was followed by endocrinology when he lived in [place].  He drinks mostly diet mt dew, some water, 4-5 beers per night and occasionally some whiskey.  He admits he does not eat on any routine pattern, sometimes will only eat 1 meal per day.  He does not engage in routine exercise, has found himself more unsteady on his feet recently and is afraid of falling.  He is due for eye exam, has never been seen by  podiatry in the past.  Analysis of his CGM shows TIR 71%, TAR 29%, TBR 0% with a GMI of 7.1%.) An ACE inhibitor/angiotensin II receptor blocker is being taken. He does not see a podiatrist.Eye exam is not current.     Review of systems  Constitutional: + Minimally fluctuating body weight, current Body mass index is 26.99 kg/m., no fatigue, no subjective hyperthermia, no subjective hypothermia Eyes: no blurry vision, no xerophthalmia ENT: no sore throat, no nodules palpated in throat, no dysphagia/odynophagia, no hoarseness Cardiovascular: no chest pain, no shortness of breath, no palpitations, no leg swelling Respiratory: no cough, no shortness of breath Gastrointestinal: no nausea/vomiting/diarrhea Musculoskeletal: no muscle/joint aches, walks with cane due to disequilibrium Skin: no rashes, no hyperemia Neurological: no tremors, no numbness, no tingling, no dizziness Psychiatric: no depression, no anxiety  Objective:     BP 129/83 (BP Location: Left Arm, Patient Position: Sitting, Cuff Size: Large)   Pulse 83   Ht 6' (1.829 m)   Wt 199 lb (90.3 kg)   BMI 26.99 kg/m   Wt Readings from Last 3 Encounters:  10/04/22 199 lb (90.3 kg)  09/07/22 204 lb (92.5 kg)  08/16/22 194 lb 6 oz (88.2 kg)     BP Readings from Last 3 Encounters:  10/04/22 129/83  08/16/22 119/70  07/16/22 122/73     Physical Exam- Limited  Constitutional:  Body mass index is 26.99 kg/m. , not in acute distress, normal state of mind Eyes:  EOMI, no exophthalmos Neck: Supple Cardiovascular: RRR, no murmurs, rubs, or gallops, no edema Respiratory: Adequate breathing efforts, no crackles, rales, rhonchi, or wheezing Musculoskeletal: no gross deformities, strength intact in all four extremities, no gross restriction of joint movements, walks with cane due to disequilibrium Skin:  no rashes, no hyperemia Neurological: no tremor with outstretched hands   Diabetic Foot Exam - Simple   No data filed      CMP ( most recent) CMP     Component Value Date/Time   NA 135 05/02/2022 1413   K 4.8 05/02/2022 1413   CL 97 05/02/2022 1413   CO2 25 05/02/2022  1413   GLUCOSE 175 (H) 05/02/2022 1413   GLUCOSE 180 (H) 08/16/2021 1234   BUN 6 (L) 05/02/2022 1413   CREATININE 0.71 (L) 05/02/2022 1413   CALCIUM 9.5 05/02/2022 1413   PROT 6.6 05/02/2022 1413   ALBUMIN 4.2 05/02/2022 1413   AST 29 05/02/2022 1413   ALT 21 05/02/2022 1413   ALKPHOS 87 05/02/2022 1413   BILITOT 1.0 05/02/2022 1413   GFRNONAA 102 07/22/2020 0916   GFRAA 118 07/22/2020 0916     Diabetic Labs (most recent): Lab Results  Component Value Date   HGBA1C 6.3 (H) 08/16/2022   HGBA1C 7.1 (H) 05/02/2022   HGBA1C 6.7 (H) 01/30/2022     Lipid Panel ( most recent) Lipid Panel     Component Value Date/Time   CHOL 177 09/21/2021 1309   TRIG 126 09/21/2021 1309   HDL 79 09/21/2021 1309   CHOLHDL 2.2 09/21/2021 1309   LDLCALC 76 09/21/2021 1309   LABVLDL 22 09/21/2021 1309      Lab Results  Component Value Date   TSH 1.330 09/21/2021           Assessment & Plan:   1) Type 1 diabetes mellitus without complication (HCC)  He presents today for his consultation with his CGM showing mostly at goal glycemic profile overall.  His most recent A1c on 08/16/22 was 6.3%. He was followed by endocrinology when he lived in [place].  He drinks mostly diet mt dew, some water, 4-5 beers per night and occasionally some whiskey.  He admits he does not eat on any routine pattern, sometimes will only eat 1 meal per day.  He does not engage in routine exercise, has found himself more unsteady on his feet recently and is afraid of falling.  He is due for eye exam, has never been seen by podiatry in the past.  Analysis of his CGM shows TIR 71%, TAR 29%, TBR 0% with a GMI of 7.1%.  - Dustin Graham has currently uncontrolled symptomatic type 1 DM since 72 years of age, with most recent A1c of 6.3 %.   -Recent labs reviewed.  - I  had a long discussion with him about the progressive nature of diabetes and the pathology behind its complications. 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 These are all discussed in detail with him.  The following Lifestyle Medicine recommendations according to American College of Lifestyle Medicine [facility]) were discussed and offered to patient and he agrees to start the journey:  A. Whole Foods, Plant-based plate comprising of fruits and vegetables, plant-based proteins, whole-grain carbohydrates was discussed in detail with the patient.   A list for source of those nutrients were also provided to the patient.  Patient will use only water or unsweetened tea for hydration. B.  The need to stay away from risky substances including alcohol, smoking; obtaining 7 to 9 hours of restorative sleep, at least 150 minutes of moderate intensity exercise weekly, the importance of healthy social connections,  and stress reduction techniques were discussed. C.  A full color page of  Calorie density of various food groups per pound showing examples of each food groups was provided to the patient.  - I have counseled him on diet and weight management by adopting a carbohydrate restricted/protein rich diet. Patient is encouraged to switch to unprocessed or minimally processed complex starch and increased protein  intake (animal or plant source), fruits, and vegetables. -  he is advised to stick to a routine mealtimes to eat 3 meals a day and avoid unnecessary snacks (to snack only to correct hypoglycemia).   - he acknowledges that there is a room for improvement in his food and drink choices. - Suggestion is made for him to avoid simple carbohydrates from his diet including Cakes, Sweet Desserts, Ice Cream, Soda (diet and regular), Sweet Tea, Candies, Chips, Cookies, Store Bought Juices,  Alcohol in Excess of 1-2 drinks a day, Artificial Sweeteners, Coffee Creamer, and "Sugar-free" Products. This will help patient to have more stable blood glucose profile and potentially avoid unintended weight gain.  - I have approached him with the following individualized plan to manage his diabetes and patient agrees:   He is advised to lower his [place] to 20 units SQ nightly (not to take it BID anymore) and adjust his Novolog to 6-12 units TID with meals if glucose is above 70 and he is eating (Specific instructions on how to titrate insulin dosage based on glucose readings given to patient in writing).  He has never used a SSI chart before so we went over it in detail today and he demonstrated his ability to properly use it to dose his mealtime insulin with me today.  This is a big adjustment to his previous insulin plan but should be safer for him.  I anticipate needing to increase in the future, but wanted to start fresh with the dosage being closer to his weight based insulin needs.   I did encourage him to work on healthy diet which would mean he would need less insulin.  -he is encouraged to continue monitoring glucose 4 times daily (using his CGM), before meals and before bed, to log their readings on the clinic sheets provided, and bring them to review at follow up appointment in 8 weeks.  - he is warned not to take insulin without proper monitoring per orders. - Adjustment parameters are given to him for hypo and hyperglycemia in writing. - he is encouraged to call clinic for blood glucose levels less than 70 or above 300 mg /dl.  - he is not an ideal candidate of non-insulin therapies given his type 1 diagnosis.  - Specific targets for  A1c; LDL, HDL, and Triglycerides were discussed with the patient.  2) Blood Pressure /Hypertension:  his blood pressure is controlled to target.   he is advised to continue his current medications including Enalapril 20 mg p.o. daily with  breakfast.  3) Lipids/Hyperlipidemia:    Review of his recent lipid panel from 09/21/21 showed controlled LDL at 76 .  he is advised to continue Lipitor 40 mg daily at bedtime.  Side effects and precautions discussed with him.  4)  Weight/Diet:  his Body mass index is 26.99 kg/m.  -  clearly complicating his diabetes care.   he is a candidate for weight loss. I discussed with him the fact that loss of 5 - 10% of his  current body weight will have the most impact on his diabetes management.  Exercise, and detailed carbohydrates information provided  -  detailed on discharge instructions.  5) Chronic Care/Health Maintenance: -he is on ACEI/ARB and Statin medications and is encouraged to initiate and continue to follow up with Ophthalmology, Dentist, Podiatrist at least yearly or according to recommendations, and advised to stay away from smoking. I have recommended yearly flu vaccine and pneumonia vaccine at least every 5 years;  moderate intensity exercise for up to 150 minutes weekly; and sleep for at least 7 hours a day.  - he is advised to maintain close follow up with Gabriel Earing, FNP for primary care needs, as well as his other providers for optimal and coordinated care.   - Time spent in this patient care: 60 min, of which > 50% was spent in counseling him about his diabetes and the rest reviewing his blood glucose logs, discussing his hypoglycemia and hyperglycemia episodes, reviewing his current and previous labs/studies (including abstraction from other facilities) and medications doses and developing a long term treatment plan based on the latest standards of care/guidelines; and documenting his care.    Please refer to Patient Instructions for Blood Glucose Monitoring and Insulin/Medications Dosing Guide" in media tab for additional information. Please also refer to "Patient Self Inventory" in the Media tab for reviewed elements of pertinent patient history.  Dustin Graham  participated in the discussions, expressed understanding, and voiced agreement with the above plans.  All questions were answered to his satisfaction. he is encouraged to contact clinic should he have any questions or concerns prior to his return visit.     Follow up plan: - Return in about 8 weeks (around 11/29/2022) for Bring meter and logs, Diabetes F/U with A1c in office.    Ronny Bacon, [facility] [address] [place], [place] [address] Phone: [phone] Fax: [phone]  10/05/2022, 7:01 AM

## 2022-10-08 ENCOUNTER — Ambulatory Visit (INDEPENDENT_AMBULATORY_CARE_PROVIDER_SITE_OTHER): Payer: Medicare Other | Admitting: Urology

## 2022-10-08 VITALS — BP 130/77 | HR 111

## 2022-10-08 DIAGNOSIS — C671 Malignant neoplasm of dome of bladder: Secondary | ICD-10-CM

## 2022-10-08 DIAGNOSIS — Z8551 Personal history of malignant neoplasm of bladder: Secondary | ICD-10-CM

## 2022-10-08 LAB — URINALYSIS, ROUTINE W REFLEX MICROSCOPIC
Bilirubin, UA: NEGATIVE
Leukocytes,UA: NEGATIVE
Nitrite, UA: NEGATIVE
RBC, UA: NEGATIVE
Specific Gravity, UA: 1.03 (ref 1.005–1.030)
Urobilinogen, Ur: 2 mg/dL — ABNORMAL HIGH (ref 0.2–1.0)
pH, UA: 5.5 (ref 5.0–7.5)

## 2022-10-08 MED ORDER — CIPROFLOXACIN HCL 500 MG PO TABS
500.0000 mg | ORAL_TABLET | Freq: Once | ORAL | Status: AC
Start: 2022-10-08 — End: 2022-10-08
  Administered 2022-10-08: 500 mg via ORAL

## 2022-10-08 NOTE — Progress Notes (Unsigned)
   10/08/22  CC: followup bladder cancer   HPI: Mr Dodge is a 72yo here for followup for bladder cancer Blood pressure 130/77, pulse (!) 111. NED. A&Ox3.   No respiratory distress   Abd soft, NT, ND Normal phallus with bilateral descended testicles  Cystoscopy Procedure Note  Patient identification was confirmed, informed consent was obtained, and patient was prepped using Betadine solution.  Lidocaine jelly was administered per urethral meatus.     Pre-Procedure: - Inspection reveals a normal caliber ureteral meatus.  Procedure: The flexible cystoscope was introduced without difficulty - No urethral strictures/lesions are present. - Enlarged prostate  - Normal bladder neck - Bilateral ureteral orifices identified - Bladder mucosa  reveals no ulcers, tumors, or lesions - No bladder stones - No trabeculation   Post-Procedure: - Patient tolerated the procedure well  Assessment/ Plan: Followup in 3 months for cystoscopy  No follow-ups on file.  Wilkie Aye, MD

## 2022-10-09 ENCOUNTER — Encounter: Payer: Self-pay | Admitting: Urology

## 2022-10-09 NOTE — Patient Instructions (Signed)

## 2022-10-21 ENCOUNTER — Other Ambulatory Visit: Payer: Self-pay | Admitting: Family Medicine

## 2022-10-21 DIAGNOSIS — K219 Gastro-esophageal reflux disease without esophagitis: Secondary | ICD-10-CM

## 2022-11-19 ENCOUNTER — Ambulatory Visit (INDEPENDENT_AMBULATORY_CARE_PROVIDER_SITE_OTHER): Payer: Medicare Other | Admitting: Family Medicine

## 2022-11-19 ENCOUNTER — Other Ambulatory Visit: Payer: Self-pay | Admitting: Family Medicine

## 2022-11-19 ENCOUNTER — Encounter: Payer: Self-pay | Admitting: Family Medicine

## 2022-11-19 VITALS — BP 123/78 | HR 80 | Temp 98.7°F | Ht 72.0 in | Wt 197.0 lb

## 2022-11-19 DIAGNOSIS — K219 Gastro-esophageal reflux disease without esophagitis: Secondary | ICD-10-CM | POA: Diagnosis not present

## 2022-11-19 DIAGNOSIS — L989 Disorder of the skin and subcutaneous tissue, unspecified: Secondary | ICD-10-CM

## 2022-11-19 DIAGNOSIS — Z79899 Other long term (current) drug therapy: Secondary | ICD-10-CM

## 2022-11-19 DIAGNOSIS — E1065 Type 1 diabetes mellitus with hyperglycemia: Secondary | ICD-10-CM

## 2022-11-19 DIAGNOSIS — M47816 Spondylosis without myelopathy or radiculopathy, lumbar region: Secondary | ICD-10-CM

## 2022-11-19 DIAGNOSIS — M47812 Spondylosis without myelopathy or radiculopathy, cervical region: Secondary | ICD-10-CM

## 2022-11-19 MED ORDER — TRAMADOL HCL 50 MG PO TABS
100.0000 mg | ORAL_TABLET | Freq: Two times a day (BID) | ORAL | 2 refills | Status: DC | PRN
Start: 2022-11-19 — End: 2023-02-20

## 2022-11-19 NOTE — Progress Notes (Unsigned)
Established Patient Office Visit  Subjective   Patient ID: Dustin Graham, male    DOB: 1950-11-07  Age: 72 y.o. MRN: [id number]  Chief Complaint  Patient presents with   Medical Management of Chronic Issues    Skin lesion/bump below belly and above pubis    HPI DM Now established with endo for management. Last A1c was 6.3. He is symptomatic with neuropathy and his insulin regimen was adjusted quite a bit. His blood sugars then shot up, so he went back to his old regimen. He has another follow up in a few weeks.   2. HTN Complaint with meds - Yes Current Medications - enalapril 20 mg daily Pertinent ROS:  Visual Disturbances - No Chest pain - No Dyspnea - No Palpitations - No LE edema - No   3. GERD Compliant with medications - Yes Current medications - prilosec 20 mg  Cough - No Sore throat - No Voice change - No Hemoptysis - No Dysphagia or dyspepsia - No Water brash - No Red Flags (weight loss, hematochezia, melena, weight loss, early satiety, fevers, odynophagia, or persistent vomiting) - No  4. Skin lesion To lower abdomen. 1 month ago. Has gotten larger. Denies itching, pain, drainage, warmth, fever. He has tried nystatin without improvement.   5. Pain Cause of pain- history of a kyphoplasty at T12 due to a compression fracture.    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 Mild bilateral bony foraminal narrowing at those levels.     Pain location- back, neck pain Pain on scale of 1-10- 7/10 without medication and 4-5/10 with medication Frequency- daily. He does not take tramadol daily however.  What increases pain- pain is constant What makes pain better- medication Effects on ADL- makes it slower Any change in general medical condition- no Current  opioids rx- Tramadol # meds rx- 60 Effectiveness of current meds- very good Adverse reactions from pain meds- none Morphine equivalent- 20 MME/day   Pill count performed-No Last drug screen - 10/19/21 ( high risk q19m, moderate risk q74m, low risk yearly ) Urine drug screen today- Yes Was the NCCSR reviewed- Yes             If yes were their any concerning findings? - No   Overdose risk: 190       10/19/2021    3:49 PM  Opioid Risk   Alcohol 3  Illegal Drugs 0  Rx Drugs 0  Alcohol 0  Illegal Drugs 0  Rx Drugs 0  Age between 16-45 years  0  History of Preadolescent Sexual Abuse 0  Psychological Disease 0  Depression 0  Opioid Risk Tool Scoring 3  Opioid Risk Interpretation Low Risk     Past Medical History:  Diagnosis Date   Arthritis    Bladder cancer (HCC) 02/16/2022   GERD (gastroesophageal reflux disease)    HLD (hyperlipidemia)    Hypertension associated with diabetes (HCC) 02/29/2016   Rosacea 01/28/2019   Type 1 diabetes (HCC)       ROS As per HPI.   Objective:     BP 123/78   Pulse 80   Temp 98.7 F (37.1 C)   Ht 6' (1.829 m)   Wt 197 lb (89.4 kg)   SpO2 95%   BMI 26.72 kg/m  Wt Readings  from Last 3 Encounters:  11/19/22 197 lb (89.4 kg)  10/04/22 199 lb (90.3 kg)  09/07/22 204 lb (92.5 kg)    Physical Exam Vitals and nursing note reviewed.  Constitutional:      General: He is not in acute distress.    Appearance: He is not ill-appearing, toxic-appearing or diaphoretic.  HENT:     Head: Normocephalic and atraumatic.  Eyes:     General: No scleral icterus.       Right eye: No discharge.        Left eye: No discharge.     Extraocular Movements: Extraocular movements intact.     Pupils: Pupils are equal, round, and reactive to light.  Neck:     Thyroid: No thyroid mass, thyromegaly or thyroid tenderness.     Vascular: No carotid bruit.  Cardiovascular:     Rate and Rhythm: Normal rate and regular rhythm.     Heart sounds: Normal  heart sounds. No murmur heard. Pulmonary:     Effort: Pulmonary effort is normal.     Breath sounds: Normal breath sounds.  Abdominal:     General: Bowel sounds are normal. There is no distension.     Tenderness: There is no abdominal tenderness. There is no guarding or rebound.  Musculoskeletal:     Cervical back: No rigidity.     Right lower leg: No edema.     Left lower leg: No edema.  Skin:    General: Skin is warm and dry.     Coloration: Skin is not jaundiced.     Findings: Lesion (1 cm raised pink skin lesion to left lower abdomen with central ulceration. No signs of infection) present.  Neurological:     General: No focal deficit present.     Mental Status: He is alert and oriented to person, place, and time.  Psychiatric:        Mood and Affect: Mood normal.        Behavior: Behavior normal.        Thought Content: Thought content normal.        Judgment: Judgment normal.      No results found for any visits on 11/19/22.    The 10-year ASCVD risk score (Arnett DK, et al., 2019) is: 29.3%    Assessment & Plan:   Rylan was seen today for medical management of chronic issues.  Diagnoses and all orders for this visit:  Type 1 diabetes mellitus with hyperglycemia (HCC) Managed by endocrinology. Last A1c at goal. Has follow up in a few weeks.   Gastroesophageal reflux disease, unspecified whether esophagitis present Well controlled on current regimen.   Cervical spondylosis Spondylosis of lumbar spine Controlled substance agreement signed Pain is manageable with current regimen. CSA is UTD. UDS today. PDMP reviewed, no red flags. Refill provided.  -     ToxASSURE Select 13 (MW), Urine -     traMADol (ULTRAM) 50 MG tablet; Take 2 tablets (100 mg total) by mouth every 12 (twelve) hours as needed.  Skin lesion Referral as below for further evaluation.  -     Ambulatory referral to Dermatology   Return in about 3 months (around 02/19/2023) for chronic follow  up.   The patient indicates understanding of these issues and agrees with the plan.  Gabriel Earing, FNP

## 2022-11-22 LAB — TOXASSURE SELECT 13 (MW), URINE

## 2022-12-07 ENCOUNTER — Other Ambulatory Visit: Payer: Self-pay | Admitting: Family Medicine

## 2022-12-07 DIAGNOSIS — E1159 Type 2 diabetes mellitus with other circulatory complications: Secondary | ICD-10-CM

## 2022-12-07 DIAGNOSIS — E782 Mixed hyperlipidemia: Secondary | ICD-10-CM

## 2022-12-10 ENCOUNTER — Ambulatory Visit: Payer: Medicare Other | Admitting: Nurse Practitioner

## 2022-12-10 ENCOUNTER — Encounter: Payer: Self-pay | Admitting: Nurse Practitioner

## 2022-12-10 VITALS — BP 119/78 | HR 77 | Ht 72.0 in | Wt 197.2 lb

## 2022-12-10 DIAGNOSIS — E1065 Type 1 diabetes mellitus with hyperglycemia: Secondary | ICD-10-CM | POA: Diagnosis not present

## 2022-12-10 DIAGNOSIS — E785 Hyperlipidemia, unspecified: Secondary | ICD-10-CM

## 2022-12-10 DIAGNOSIS — E1159 Type 2 diabetes mellitus with other circulatory complications: Secondary | ICD-10-CM

## 2022-12-10 DIAGNOSIS — I10 Essential (primary) hypertension: Secondary | ICD-10-CM | POA: Diagnosis not present

## 2022-12-10 LAB — POCT GLYCOSYLATED HEMOGLOBIN (HGB A1C): Hemoglobin A1C: 7 % — AB (ref 4.0–5.6)

## 2022-12-10 MED ORDER — INSULIN ASPART FLEXPEN 100 UNIT/ML ~~LOC~~ SOPN
10.0000 [IU] | PEN_INJECTOR | Freq: Three times a day (TID) | SUBCUTANEOUS | 3 refills | Status: DC
Start: 2022-12-10 — End: 2023-03-14

## 2022-12-10 MED ORDER — INSULIN DEGLUDEC FLEXTOUCH 200 UNIT/ML ~~LOC~~ SOPN
36.0000 [IU] | PEN_INJECTOR | Freq: Every evening | SUBCUTANEOUS | 3 refills | Status: DC
Start: 2022-12-10 — End: 2023-03-14

## 2022-12-10 NOTE — Progress Notes (Signed)
Endocrinology Follow Up Note       12/10/2022, 2:50 PM   Subjective:    Patient ID: Dustin Graham, male    DOB: 09/03/1950.  Dustin Graham is being seen in follow up after being seen in consultation for management of currently uncontrolled symptomatic diabetes requested by  Gabriel Earing, FNP.   Past Medical History:  Diagnosis Date   Arthritis    Bladder cancer (HCC) 02/16/2022   GERD (gastroesophageal reflux disease)    HLD (hyperlipidemia)    Hypertension associated with diabetes (HCC) 02/29/2016   Rosacea 01/28/2019   Type 1 diabetes (HCC)     Past Surgical History:  Procedure Laterality Date   broken nose     CHOLECYSTECTOMY     CYSTOSCOPY W/ RETROGRADES Bilateral 02/15/2022   Procedure: CYSTOSCOPY WITH RETROGRADE PYELOGRAM;  Surgeon: Malen Gauze, MD;  Location: [facility];  Service: Urology;  Laterality: Bilateral;  pt knows to arrive at 8:00   GALLBLADDER SURGERY     LUMBAR DISC SURGERY     fracture   TRANSURETHRAL RESECTION OF BLADDER TUMOR N/A 02/15/2022   Procedure: TRANSURETHRAL RESECTION OF BLADDER TUMOR (TURBT);  Surgeon: Malen Gauze, MD;  Location: [facility];  Service: Urology;  Laterality: N/A;   TRANSURETHRAL RESECTION OF BLADDER TUMOR N/A 03/22/2022   Procedure: TRANSURETHRAL RESECTION OF BLADDER TUMOR (TURBT);  Surgeon: Malen Gauze, MD;  Location: [facility];  Service: Urology;  Laterality: N/A;    Social History   Socioeconomic History   Marital status: Divorced    Spouse name: Not on file   Number of children: 1   Years of education: 12   Highest education level: Some college, no degree  Occupational History    Employer: [employer]   Occupation: Estate manager/land agent    Comment: [employer]  Tobacco Use   Smoking status: Never   Smokeless tobacco: Never  Vaping Use   Vaping status: Never Used  Substance and Sexual Activity   Alcohol use: Yes     Alcohol/week: 3.0 - 4.0 standard drinks of alcohol    Types: 3 - 4 Standard drinks or equivalent per week    Comment: several beers and a couple of shots per day   Drug use: Not Currently    Types: Marijuana   Sexual activity: Not on file  Other Topics Concern   Not on file  Social History Narrative   Lives alone   Still working full time at [employer]   Daughter lives 20 miles away   Social Determinants of Health   Financial Resource Strain: Low Risk  (11/15/2022)   Overall Financial Resource Strain (CARDIA)    Difficulty of Paying Living Expenses: Not very hard  Food Insecurity: No Food Insecurity (11/15/2022)   Hunger Vital Sign    Worried About Running Out of Food in the Last Year: Never true    Ran Out of Food in the Last Year: Never true  Transportation Needs: No Transportation Needs (11/15/2022)   PRAPARE - Administrator, Civil Service (Medical): No    Lack of Transportation (Non-Medical): No  Physical Activity: Inactive (11/15/2022)   Exercise Vital Sign  Days of Exercise per Week: 0 days    Minutes of Exercise per Session: 30 min  Stress: Stress Concern Present (11/15/2022)   [employer] of Occupational Health - Occupational Stress Questionnaire    Feeling of Stress : To some extent  Social Connections: Socially Isolated (11/15/2022)   Social Connection and Isolation Panel [NHANES]    Frequency of Communication with Friends and Family: Twice a week    Frequency of Social Gatherings with Friends and Family: Once a week    Attends Religious Services: Never    Database administrator or Organizations: No    Attends Engineer, structural: Never    Marital Status: Divorced    Family History  Problem Relation Age of Onset   Peptic Ulcer Father    Diabetes Paternal Aunt    Colon cancer Neg Hx    Esophageal cancer Neg Hx    Stomach cancer Neg Hx    Colon polyps Neg Hx     Outpatient Encounter Medications as of 12/10/2022  Medication  Sig   Accu-Chek Softclix Lancets lancets Check BS 3 times a day Morning, Noon and Bedtime Dx E11.9   Aspirin-Salicylamide-Caffeine (BC HEADACHE POWDER PO) Take 1 packet by mouth daily as needed (Back pain).   atorvastatin (LIPITOR) 40 MG tablet TAKE 1 TABLET BY MOUTH EVERY DAY   Blood Glucose Monitoring Suppl DEVI 1 each by Does not apply route in the morning, at noon, and at bedtime. May substitute to any manufacturer covered by patient's insurance.   Continuous Blood Gluc Receiver (FREESTYLE LIBRE 2 READER) DEVI Use to test blood sugar up to 6 times daily. DX; E11.9   Continuous Blood Gluc Sensor (FREESTYLE LIBRE 2 SENSOR) MISC Use to test blood sugar up to 6 times daily. DX: E11.9   enalapril (VASOTEC) 20 MG tablet TAKE 1 TABLET BY MOUTH EVERY DAY   glucose blood (ACCU-CHEK GUIDE) test strip Check BS 3 times a day Morning, Noon and Bedtime Dx E11.9   Insulin Pen Needle (BD PEN NEEDLE MICRO U/F) 32G X 6 MM MISC Use with injection 6 times daily Dx E10.9   omeprazole (PRILOSEC) 20 MG capsule Take 1 capsule (20 mg total) by mouth daily.   traMADol (ULTRAM) 50 MG tablet Take 2 tablets (100 mg total) by mouth every 12 (twelve) hours as needed.   [DISCONTINUED] Insulin Aspart FlexPen (NOVOLOG) 100 UNIT/ML Inject 6-12 Units into the skin 3 (three) times daily with meals.   [DISCONTINUED] Insulin Degludec FlexTouch 200 UNIT/ML SOPN Inject 20 Units into the skin at bedtime.   Insulin Aspart FlexPen (NOVOLOG) 100 UNIT/ML Inject 10-16 Units into the skin 3 (three) times daily with meals.   Insulin Degludec FlexTouch 200 UNIT/ML SOPN Inject 36 Units into the skin at bedtime.   [DISCONTINUED] atorvastatin (LIPITOR) 40 MG tablet TAKE 1 TABLET BY MOUTH EVERY DAY   No facility-administered encounter medications on file as of 12/10/2022.    ALLERGIES: Allergies  Allergen Reactions   Codeine Nausea And Vomiting    VACCINATION STATUS: Immunization History  Administered Date(s) Administered   Fluad  Quad(high Dose 65+) 07/27/2020, 03/22/2021   Influenza Split 03/03/2014   Influenza, High Dose Seasonal PF 01/21/2019   Influenza-Unspecified 01/23/2019   Moderna Sars-Covid-2 Vaccination 07/02/2019, 07/22/2019, 07/31/2019, 02/15/2020, 09/18/2020   Pneumococcal Conjugate-13 09/26/2016   Pneumococcal Polysaccharide-23 07/23/2018    Diabetes He presents for his follow-up diabetic visit. He has type 1 diabetes mellitus. Onset time: Diagnosed at approx age of 30. His disease course  has been stable. There are no hypoglycemic associated symptoms. There are no diabetic associated symptoms. There are no hypoglycemic complications. Symptoms are stable. Diabetic complications include heart disease (MI). Risk factors for coronary artery disease include diabetes mellitus, dyslipidemia, family history, male sex, sedentary lifestyle and hypertension. Current diabetic treatment includes intensive insulin program. He is compliant with treatment most of the time (currently on Tresiba 24 units in am and 20 units in pm; Novolog 24-30 units TID). His weight is fluctuating minimally. He is following a generally unhealthy diet. When asked about meal planning, he reported none. He has not had a previous visit with a dietitian. He rarely participates in exercise. His home blood glucose trend is fluctuating minimally. His overall blood glucose range is 140-180 mg/dl. (He presents today with his CGM showing mostly at goal glycemic profile.  His POCT A1c today is 7%, increasing slightly from last visit of 6.3%.  He notes he gave his new insulin regimen about 2 weeks before reverting back to his old regimen due to hyperglycemia.  He notes his diet is still not where it needs to be either.  Analysis of his CGM shows TIR 67%, TAR 33%, TBR 0% with a GMI of 7.2%.) An ACE inhibitor/angiotensin II receptor blocker is being taken. He does not see a podiatrist.Eye exam is not current.     Review of systems  Constitutional: + Minimally  fluctuating body weight, current Body mass index is 26.75 kg/m., no fatigue, no subjective hyperthermia, no subjective hypothermia Eyes: no blurry vision, no xerophthalmia ENT: no sore throat, no nodules palpated in throat, no dysphagia/odynophagia, no hoarseness Cardiovascular: no chest pain, no shortness of breath, no palpitations, no leg swelling Respiratory: no cough, no shortness of breath Gastrointestinal: no nausea/vomiting/diarrhea Musculoskeletal: no muscle/joint aches, walks with cane due to disequilibrium Skin: no rashes, no hyperemia Neurological: no tremors, no numbness, no tingling, no dizziness Psychiatric: no depression, no anxiety  Objective:     BP 119/78 (BP Location: Left Arm, Patient Position: Sitting, Cuff Size: Large)   Pulse 77   Ht 6' (1.829 m)   Wt 197 lb 3.2 oz (89.4 kg)   BMI 26.75 kg/m   Wt Readings from Last 3 Encounters:  12/10/22 197 lb 3.2 oz (89.4 kg)  11/19/22 197 lb (89.4 kg)  10/04/22 199 lb (90.3 kg)     BP Readings from Last 3 Encounters:  12/10/22 119/78  11/19/22 123/78  10/08/22 130/77     Physical Exam- Limited  Constitutional:  Body mass index is 26.75 kg/m. , not in acute distress, normal state of mind Eyes:  EOMI, no exophthalmos Musculoskeletal: no gross deformities, strength intact in all four extremities, no gross restriction of joint movements, walks with cane due to disequilibrium Skin:  no rashes, no hyperemia Neurological: no tremor with outstretched hands   Diabetic Foot Exam - Simple   Simple Foot Form Diabetic Foot exam was performed with the following findings: Yes 12/10/2022  2:42 PM  Visual Inspection See comments: Yes Sensation Testing Intact to touch and monofilament testing bilaterally: Yes Pulse Check Posterior Tibialis and Dorsalis pulse intact bilaterally: Yes Comments Mild onychomycosis bilaterally; decreased cap refill bilaterally     CMP ( most recent) CMP     Component Value Date/Time   NA  135 05/02/2022 1413   K 4.8 05/02/2022 1413   CL 97 05/02/2022 1413   CO2 25 05/02/2022 1413   GLUCOSE 175 (H) 05/02/2022 1413   GLUCOSE 180 (H) 08/16/2021 1234   BUN  6 (L) 05/02/2022 1413   CREATININE 0.71 (L) 05/02/2022 1413   CALCIUM 9.5 05/02/2022 1413   PROT 6.6 05/02/2022 1413   ALBUMIN 4.2 05/02/2022 1413   AST 29 05/02/2022 1413   ALT 21 05/02/2022 1413   ALKPHOS 87 05/02/2022 1413   BILITOT 1.0 05/02/2022 1413   GFRNONAA 102 07/22/2020 0916   GFRAA 118 07/22/2020 0916     Diabetic Labs (most recent): Lab Results  Component Value Date   HGBA1C 7.0 (A) 12/10/2022   HGBA1C 6.3 (H) 08/16/2022   HGBA1C 7.1 (H) 05/02/2022     Lipid Panel ( most recent) Lipid Panel     Component Value Date/Time   CHOL 177 09/21/2021 1309   TRIG 126 09/21/2021 1309   HDL 79 09/21/2021 1309   CHOLHDL 2.2 09/21/2021 1309   LDLCALC 76 09/21/2021 1309   LABVLDL 22 09/21/2021 1309      Lab Results  Component Value Date   TSH 1.330 09/21/2021           Assessment & Plan:   1) Type 1 diabetes mellitus without complication (HCC)  He presents today with his CGM showing mostly at goal glycemic profile.  His POCT A1c today is 7%, increasing slightly from last visit of 6.3%.  He notes he gave his new insulin regimen about 2 weeks before reverting back to his old regimen due to hyperglycemia.  He notes his diet is still not where it needs to be either.  Analysis of his CGM shows TIR 67%, TAR 33%, TBR 0% with a GMI of 7.2%.  - Dustin Graham has currently uncontrolled symptomatic type 1 DM since 72 years of age.   -Recent labs reviewed.  - I had a long discussion with him about the progressive nature of diabetes and the pathology behind its complications. 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 These are all discussed in detail with him.  The following  Lifestyle Medicine recommendations according to American College of Lifestyle Medicine [facility]) were discussed and offered to patient and he agrees to start the journey:  A. Whole Foods, Plant-based plate comprising of fruits and vegetables, plant-based proteins, whole-grain carbohydrates was discussed in detail with the patient.   A list for source of those nutrients were also provided to the patient.  Patient will use only water or unsweetened tea for hydration. B.  The need to stay away from risky substances including alcohol, smoking; obtaining 7 to 9 hours of restorative sleep, at least 150 minutes of moderate intensity exercise weekly, the importance of healthy social connections,  and stress reduction techniques were discussed. C.  A full color page of  Calorie density of various food groups per pound showing examples of each food groups was provided to the patient.  - Nutritional counseling repeated at each appointment due to patients tendency to fall back in to old habits.  - The patient admits there is a room for improvement in their diet and drink choices. -  Suggestion is made for the patient to avoid simple carbohydrates from their diet including Cakes, Sweet Desserts / Pastries, Ice Cream, Soda (diet and regular), Sweet Tea, Candies, Chips, Cookies, Sweet Pastries, Store Bought Juices, Alcohol in Excess of 1-2 drinks a day, Artificial Sweeteners, Coffee Creamer, and "Sugar-free" Products. This will help patient to have stable blood glucose profile and potentially avoid unintended weight gain.   -  I encouraged the patient to switch to unprocessed or minimally processed complex starch and increased protein intake (animal or plant source), fruits, and vegetables.   - Patient is advised to stick to a routine mealtimes to eat 3 meals a day and avoid unnecessary snacks (to snack only to correct hypoglycemia).  - I have approached him with the following individualized plan to manage his diabetes  and patient agrees:   He is advised to adjust his Evaristo Bury to 36 units SQ nightly, and Novolog 10-16 units TID with meals if glucose is above 90 and he is eating (Specific instructions on how to titrate insulin dosage based on glucose readings given to patient in writing).  -he is encouraged to continue monitoring glucose 4 times daily (using his CGM), before meals and before bed, and to call the clinic if he has readings less than 70 or above 300 for 3 tests in a row.  - he is warned not to take insulin without proper monitoring per orders. - Adjustment parameters are given to him for hypo and hyperglycemia in writing.  - he is not an ideal candidate of non-insulin therapies given his type 1 diagnosis.  - Specific targets for  A1c; LDL, HDL, and Triglycerides were discussed with the patient.  2) Blood Pressure /Hypertension:  his blood pressure is controlled to target.   he is advised to continue his current medications including Enalapril 20 mg p.o. daily with breakfast.  3) Lipids/Hyperlipidemia:    Review of his recent lipid panel from 09/21/21 showed controlled LDL at 76 .  he is advised to continue Lipitor 40 mg daily at bedtime.  Side effects and precautions discussed with him.  4)  Weight/Diet:  his Body mass index is 26.75 kg/m.  -  clearly complicating his diabetes care.   he is a candidate for weight loss. I discussed with him the fact that loss of 5 - 10% of his  current body weight will have the most impact on his diabetes management.  Exercise, and detailed carbohydrates information provided  -  detailed on discharge instructions.  5) Chronic Care/Health Maintenance: -he is on ACEI/ARB and Statin medications and is encouraged to initiate and continue to follow up with Ophthalmology, Dentist, Podiatrist at least yearly or according to recommendations, and advised to stay away from smoking. I have recommended yearly flu vaccine and pneumonia vaccine at least every 5 years; moderate  intensity exercise for up to 150 minutes weekly; and sleep for at least 7 hours a day.  - he is advised to maintain close follow up with Gabriel Earing, FNP for primary care needs, as well as his other providers for optimal and coordinated care.    I spent  41  minutes in the care of the patient today including review of labs from CMP, Lipids, Thyroid Function, Hematology (current and previous including abstractions from other facilities); face-to-face time discussing  his blood glucose readings/logs, discussing hypoglycemia and hyperglycemia episodes and symptoms, medications doses, his options of short and long term treatment based on the latest standards of care / guidelines;  discussion about incorporating lifestyle medicine;  and documenting the encounter. Risk reduction counseling performed per USPSTF guidelines to reduce obesity and cardiovascular risk factors.     Please refer to Patient Instructions for Blood Glucose Monitoring and Insulin/Medications Dosing Guide"  in media tab for additional information. Please  also refer to " Patient Self Inventory" in the Media  tab for reviewed elements of pertinent patient history.  Dustin Graham participated in the discussions, expressed understanding, and voiced agreement with the above plans.  All questions were answered to his satisfaction. he is encouraged to contact clinic should he have any questions or concerns prior to his return visit.     Follow up plan: - Return in about 3 months (around 03/12/2023) for Diabetes F/U with A1c in office, No previsit labs, Bring meter and logs.    Ronny Bacon, [facility] [address] [place], [place] [address] Phone: [phone] Fax: [phone]  12/10/2022, 2:50 PM

## 2022-12-31 ENCOUNTER — Other Ambulatory Visit: Payer: Self-pay | Admitting: Family Medicine

## 2022-12-31 DIAGNOSIS — E1159 Type 2 diabetes mellitus with other circulatory complications: Secondary | ICD-10-CM

## 2022-12-31 DIAGNOSIS — I152 Hypertension secondary to endocrine disorders: Secondary | ICD-10-CM

## 2023-01-21 ENCOUNTER — Other Ambulatory Visit: Payer: Medicare Other | Admitting: Urology

## 2023-02-04 ENCOUNTER — Other Ambulatory Visit: Payer: Medicare Other | Admitting: Urology

## 2023-02-14 ENCOUNTER — Ambulatory Visit: Payer: Medicare Other

## 2023-02-20 ENCOUNTER — Encounter: Payer: Self-pay | Admitting: Family Medicine

## 2023-02-20 ENCOUNTER — Ambulatory Visit: Payer: Medicare Other

## 2023-02-20 ENCOUNTER — Ambulatory Visit: Payer: Medicare Other | Admitting: Family Medicine

## 2023-02-20 VITALS — BP 117/68 | HR 91 | Temp 98.2°F | Ht 72.0 in | Wt 196.4 lb

## 2023-02-20 DIAGNOSIS — B070 Plantar wart: Secondary | ICD-10-CM

## 2023-02-20 DIAGNOSIS — E782 Mixed hyperlipidemia: Secondary | ICD-10-CM

## 2023-02-20 DIAGNOSIS — Z79899 Other long term (current) drug therapy: Secondary | ICD-10-CM

## 2023-02-20 DIAGNOSIS — M47812 Spondylosis without myelopathy or radiculopathy, cervical region: Secondary | ICD-10-CM | POA: Diagnosis not present

## 2023-02-20 DIAGNOSIS — K219 Gastro-esophageal reflux disease without esophagitis: Secondary | ICD-10-CM | POA: Diagnosis not present

## 2023-02-20 DIAGNOSIS — Z23 Encounter for immunization: Secondary | ICD-10-CM | POA: Diagnosis not present

## 2023-02-20 DIAGNOSIS — E1069 Type 1 diabetes mellitus with other specified complication: Secondary | ICD-10-CM

## 2023-02-20 DIAGNOSIS — M47816 Spondylosis without myelopathy or radiculopathy, lumbar region: Secondary | ICD-10-CM | POA: Diagnosis not present

## 2023-02-20 DIAGNOSIS — E1159 Type 2 diabetes mellitus with other circulatory complications: Secondary | ICD-10-CM

## 2023-02-20 DIAGNOSIS — E1065 Type 1 diabetes mellitus with hyperglycemia: Secondary | ICD-10-CM

## 2023-02-20 DIAGNOSIS — I152 Hypertension secondary to endocrine disorders: Secondary | ICD-10-CM

## 2023-02-20 MED ORDER — TRAMADOL HCL 50 MG PO TABS
100.0000 mg | ORAL_TABLET | Freq: Two times a day (BID) | ORAL | 2 refills | Status: DC | PRN
Start: 2023-02-20 — End: 2023-06-07

## 2023-02-20 NOTE — Progress Notes (Signed)
Established Patient Office Visit  Subjective   Patient ID: Dustin Graham, male    DOB: May 13, 1951  Age: 72 y.o. MRN: [id number]  Chief Complaint  Patient presents with   Medical Management of Chronic Issues    HPI DM Managed by endo. Last A1c 7.0. Reports stable.   2. HTN Complaint with meds - Yes Current Medications - enalapril 20 mg daily Pertinent ROS:  Visual Disturbances - No Chest pain - No Dyspnea - No Palpitations - No LE edema - No   3. GERD Compliant with medications - Yes Current medications - prilosec 20 mg  Cough - No Sore throat - No Voice change - No Hemoptysis - No Dysphagia or dyspepsia - No Water brash - No Red Flags (weight loss, hematochezia, melena, weight loss, early satiety, fevers, odynophagia, or persistent vomiting) - No  4.  Foot pain He has felt a hard spot on the lateral side of his left foot. Painful when standing barefoot. No exudate, fever, or chills.   5. Pain Cause of pain- history of a kyphoplasty at T12 due to a compression fracture.    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 Mild bilateral bony foraminal narrowing at those levels.     Pain location- back, neck pain Pain on scale of 1-10- 7/10 without medication and 4-5/10 with medication Frequency- daily. He does not take tramadol daily however.  What increases pain- pain is constant What makes pain better- medication Effects on ADL- makes it slower Any change in general medical condition- no Current opioids rx- Tramadol # meds rx- 60 Effectiveness of current meds- very good Adverse reactions from pain meds- none Morphine equivalent- 20 MME/day   Pill count performed-No Last drug screen - 11/19/22 ( high risk q42m, moderate risk q66m, low risk yearly ) Urine drug  screen today- Yes Was the [facility] reviewed- Yes             If yes were their any concerning findings? - No   Overdose risk: 190       10/19/2021    3:49 PM  Opioid Risk   Alcohol 3  Illegal Drugs 0  Rx Drugs 0  Alcohol 0  Illegal Drugs 0  Rx Drugs 0  Age between 16-45 years  0  History of Preadolescent Sexual Abuse 0  Psychological Disease 0  Depression 0  Opioid Risk Tool Scoring 3  Opioid Risk Interpretation Low Risk     Past Medical History:  Diagnosis Date   Arthritis    Bladder cancer (HCC) 02/16/2022   GERD (gastroesophageal reflux disease)    HLD (hyperlipidemia)    Hypertension associated with diabetes (HCC) 02/29/2016   Rosacea 01/28/2019   Type 1 diabetes (HCC)       ROS As per HPI.   Objective:     Pulse 91   Temp 98.2 F (36.8 C) (Temporal)   Ht 6' (1.829 m)   Wt 196 lb 6 oz (89.1 kg)   BMI 26.63 kg/m  Wt Readings from Last 3 Encounters:  02/20/23 196 lb 6 oz (89.1 kg)  12/10/22 197 lb 3.2 oz (89.4 kg)  11/19/22 197 lb (89.4 kg)    Physical Exam Vitals and nursing note reviewed.  Constitutional:      General: He is not in acute distress.  Appearance: He is not ill-appearing, toxic-appearing or diaphoretic.  HENT:     Head: Normocephalic and atraumatic.  Eyes:     General: No scleral icterus.       Right eye: No discharge.        Left eye: No discharge.     Extraocular Movements: Extraocular movements intact.     Pupils: Pupils are equal, round, and reactive to light.  Neck:     Thyroid: No thyroid mass, thyromegaly or thyroid tenderness.     Vascular: No carotid bruit.  Cardiovascular:     Rate and Rhythm: Normal rate and regular rhythm.     Heart sounds: Normal heart sounds. No murmur heard. Pulmonary:     Effort: Pulmonary effort is normal.     Breath sounds: Normal breath sounds.  Abdominal:     General: Bowel sounds are normal. There is no distension.     Tenderness: There is no abdominal tenderness. There is no guarding  or rebound.  Musculoskeletal:     Cervical back: No rigidity.     Right lower leg: No edema.     Left lower leg: No edema.  Feet:     Comments: Plantars wart present to lateral left foot. No signs of infection.  Skin:    General: Skin is warm and dry.     Coloration: Skin is not jaundiced.  Neurological:     General: No focal deficit present.     Mental Status: He is alert and oriented to person, place, and time.  Psychiatric:        Mood and Affect: Mood normal.        Behavior: Behavior normal.        Thought Content: Thought content normal.        Judgment: Judgment normal.      No results found for any visits on 02/20/23.    The 10-year ASCVD risk score (Arnett DK, et al., 2019) is: 37.6%    Assessment & Plan:    Dustin Graham" was seen today for medical management of chronic issues.  Diagnoses and all orders for this visit:  Type 1 diabetes mellitus with hyperglycemia (HCC) Managed by endocrinology. Last A1c 7.0. He will return for fasting labs.  -     CBC with Differential/Platelet; Future -     CMP14+EGFR; Future  Hypertension associated with diabetes (HCC) Well controlled on current regimen.  -     CBC with Differential/Platelet; Future -     CMP14+EGFR; Future -     TSH; Future  Gastroesophageal reflux disease, unspecified whether esophagitis present Well controlled on current regimen.   Cervical spondylosis Spondylosis of lumbar spine Controlled substance agreement signed Manageable with tramadol. PDMP reviewed, no red flags. UDS and CSA are UTD. Refills provided.  -     traMADol (ULTRAM) 50 MG tablet; Take 2 tablets (100 mg total) by mouth every 12 (twelve) hours as needed.  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 On statin.  -     Lipid panel; Future  Plantar wart of left foot Discussed OTC treatments. Discussed referral to podiatry if no improvement.    Return in about 3  months (around 05/22/2023) for chronic follow up.   The patient indicates understanding of these issues and agrees with the plan.  Gabriel Earing, FNP

## 2023-02-20 NOTE — Patient Instructions (Signed)
Vertigo Vertigo is the feeling that you or the things around you are moving or spinning when they're not. It's different than feeling dizzy. It can also cause: Loss of balance. Trouble standing or walking. Nausea and vomiting. This feeling can come and go at any time. It can last from a few seconds to minutes or even hours. It may go away on its own or be treated with medicine. What are the types of vertigo? There are two types of vertigo: Peripheral vertigo happens when parts of your inner ear don't work like they should. This is the more common type. Central vertigo happens when your brain and spinal cord don't work like they should. Your health care provider will do tests to find out what kind of vertigo you have. This will help them decide on the right treatment for you. Follow these instructions at home: Eating and drinking Drink enough fluid to keep your pee (urine) pale yellow. Do not drink alcohol. Activity When you get up in the morning, first sit up on the side of the bed. When you feel okay, stand slowly while holding onto something. Move slowly. Avoid sudden body or head movements. Avoid certain positions, as told by your provider. Use a cane if you have trouble standing or walking. Sit down right away if you feel unsteady. Place items in your home so they're easy for you to reach without bending or leaning over. Return to normal activities when you're told. Ask what things are safe for you to do. General instructions Take your medicines only as told by your provider. Contact a health care provider if: Your medicines don't help or make your vertigo worse. You get new symptoms. You have a fever. You have nausea or vomiting. Your family or friends spot any changes in how you're acting. A part of your body goes numb. You feel tingling and prickling in a part of your body. You get very bad headaches. Get help right away if: You're always dizzy or you faint. You have a  stiff neck. You have trouble moving or speaking. Your hands, arms, or legs feel weak. Your hearing or eyesight changes. These symptoms may be an emergency. Call 911 right away. Do not wait to see if the symptoms will go away. Do not drive yourself to the hospital. This information is not intended to replace advice given to you by your health care provider. Make sure you discuss any questions you have with your health care provider. Document Revised: 08/17/2022 Document Reviewed: 08/17/2022 Elsevier Patient Education  2024 [employer].

## 2023-02-27 ENCOUNTER — Other Ambulatory Visit: Payer: Medicare Other

## 2023-02-27 DIAGNOSIS — I152 Hypertension secondary to endocrine disorders: Secondary | ICD-10-CM

## 2023-02-27 DIAGNOSIS — E1069 Type 1 diabetes mellitus with other specified complication: Secondary | ICD-10-CM

## 2023-02-27 DIAGNOSIS — E1065 Type 1 diabetes mellitus with hyperglycemia: Secondary | ICD-10-CM

## 2023-02-28 ENCOUNTER — Other Ambulatory Visit: Payer: Self-pay

## 2023-02-28 DIAGNOSIS — E1159 Type 2 diabetes mellitus with other circulatory complications: Secondary | ICD-10-CM

## 2023-02-28 DIAGNOSIS — E782 Mixed hyperlipidemia: Secondary | ICD-10-CM

## 2023-02-28 LAB — CBC WITH DIFFERENTIAL/PLATELET
Basophils Absolute: 0.1 10*3/uL (ref 0.0–0.2)
Basos: 1 %
EOS (ABSOLUTE): 0.3 10*3/uL (ref 0.0–0.4)
Eos: 6 %
Hematocrit: 41.5 % (ref 37.5–51.0)
Hemoglobin: 13.9 g/dL (ref 13.0–17.7)
Immature Grans (Abs): 0 10*3/uL (ref 0.0–0.1)
Immature Granulocytes: 0 %
Lymphocytes Absolute: 2.7 10*3/uL (ref 0.7–3.1)
Lymphs: 50 %
MCH: 33 pg (ref 26.6–33.0)
MCHC: 33.5 g/dL (ref 31.5–35.7)
MCV: 99 fL — ABNORMAL HIGH (ref 79–97)
Monocytes Absolute: 0.4 10*3/uL (ref 0.1–0.9)
Monocytes: 8 %
Neutrophils Absolute: 1.9 10*3/uL (ref 1.4–7.0)
Neutrophils: 35 %
Platelets: 166 10*3/uL (ref 150–450)
RBC: 4.21 x10E6/uL (ref 4.14–5.80)
RDW: 11.9 % (ref 11.6–15.4)
WBC: 5.4 10*3/uL (ref 3.4–10.8)

## 2023-02-28 LAB — CMP14+EGFR
ALT: 31 IU/L (ref 0–44)
AST: 32 IU/L (ref 0–40)
Albumin: 3.9 g/dL (ref 3.8–4.8)
Alkaline Phosphatase: 78 IU/L (ref 44–121)
BUN/Creatinine Ratio: 6 — ABNORMAL LOW (ref 10–24)
BUN: 5 mg/dL — ABNORMAL LOW (ref 8–27)
Bilirubin Total: 0.8 mg/dL (ref 0.0–1.2)
CO2: 24 mmol/L (ref 20–29)
Calcium: 9.4 mg/dL (ref 8.6–10.2)
Chloride: 103 mmol/L (ref 96–106)
Creatinine, Ser: 0.77 mg/dL (ref 0.76–1.27)
Globulin, Total: 2.6 g/dL (ref 1.5–4.5)
Glucose: 130 mg/dL — ABNORMAL HIGH (ref 70–99)
Potassium: 4.9 mmol/L (ref 3.5–5.2)
Sodium: 143 mmol/L (ref 134–144)
Total Protein: 6.5 g/dL (ref 6.0–8.5)
eGFR: 96 mL/min/{1.73_m2} (ref >59)

## 2023-02-28 LAB — LIPID PANEL
Cholesterol, Total: 184 mg/dL (ref 100–199)
HDL: 81 mg/dL (ref >39)
LDL CALC COMMENT:: 2.3 ratio (ref 0.0–5.0)
LDL Chol Calc (NIH): 87 mg/dL (ref 0–99)
Triglycerides: 90 mg/dL (ref 0–149)
VLDL Cholesterol Cal: 16 mg/dL (ref 5–40)

## 2023-02-28 LAB — TSH: TSH: 2.01 u[IU]/mL (ref 0.450–4.500)

## 2023-02-28 MED ORDER — ATORVASTATIN CALCIUM 80 MG PO TABS
80.0000 mg | ORAL_TABLET | Freq: Every day | ORAL | 0 refills | Status: DC
Start: 2023-02-28 — End: 2023-04-11

## 2023-03-01 ENCOUNTER — Other Ambulatory Visit: Payer: Self-pay | Admitting: Family Medicine

## 2023-03-14 ENCOUNTER — Encounter: Payer: Self-pay | Admitting: Nurse Practitioner

## 2023-03-14 ENCOUNTER — Ambulatory Visit (INDEPENDENT_AMBULATORY_CARE_PROVIDER_SITE_OTHER): Payer: Medicare Other | Admitting: Nurse Practitioner

## 2023-03-14 VITALS — BP 126/70 | HR 80 | Ht 72.0 in | Wt 200.8 lb

## 2023-03-14 DIAGNOSIS — E109 Type 1 diabetes mellitus without complications: Secondary | ICD-10-CM

## 2023-03-14 DIAGNOSIS — Z794 Long term (current) use of insulin: Secondary | ICD-10-CM

## 2023-03-14 DIAGNOSIS — E1065 Type 1 diabetes mellitus with hyperglycemia: Secondary | ICD-10-CM

## 2023-03-14 LAB — POCT UA - MICROALBUMIN

## 2023-03-14 LAB — POCT GLYCOSYLATED HEMOGLOBIN (HGB A1C): Hemoglobin A1C: 7.5 % — AB (ref 4.0–5.6)

## 2023-03-14 MED ORDER — INSULIN DEGLUDEC FLEXTOUCH 200 UNIT/ML ~~LOC~~ SOPN
40.0000 [IU] | PEN_INJECTOR | Freq: Every evening | SUBCUTANEOUS | 3 refills | Status: DC
Start: 2023-03-14 — End: 2023-08-29

## 2023-03-14 MED ORDER — INSULIN ASPART FLEXPEN 100 UNIT/ML ~~LOC~~ SOPN
20.0000 [IU] | PEN_INJECTOR | Freq: Three times a day (TID) | SUBCUTANEOUS | 3 refills | Status: DC
Start: 2023-03-14 — End: 2023-08-29

## 2023-03-14 NOTE — Progress Notes (Signed)
Endocrinology Follow Up Note       03/14/2023, 1:44 PM   Subjective:    Patient ID: Dustin Graham, male    DOB: 29-Oct-1950.  Dustin Graham is being seen in follow up after being seen in consultation for management of currently uncontrolled symptomatic diabetes requested by  Gabriel Earing, FNP.   Past Medical History:  Diagnosis Date   Arthritis    Bladder cancer (HCC) 02/16/2022   GERD (gastroesophageal reflux disease)    HLD (hyperlipidemia)    Hypertension associated with diabetes (HCC) 02/29/2016   Rosacea 01/28/2019   Type 1 diabetes (HCC)     Past Surgical History:  Procedure Laterality Date   broken nose     CHOLECYSTECTOMY     CYSTOSCOPY W/ RETROGRADES Bilateral 02/15/2022   Procedure: CYSTOSCOPY WITH RETROGRADE PYELOGRAM;  Surgeon: Malen Gauze, MD;  Location: [facility];  Service: Urology;  Laterality: Bilateral;  pt knows to arrive at 8:00   GALLBLADDER SURGERY     LUMBAR DISC SURGERY     fracture   TRANSURETHRAL RESECTION OF BLADDER TUMOR N/A 02/15/2022   Procedure: TRANSURETHRAL RESECTION OF BLADDER TUMOR (TURBT);  Surgeon: Malen Gauze, MD;  Location: [facility];  Service: Urology;  Laterality: N/A;   TRANSURETHRAL RESECTION OF BLADDER TUMOR N/A 03/22/2022   Procedure: TRANSURETHRAL RESECTION OF BLADDER TUMOR (TURBT);  Surgeon: Malen Gauze, MD;  Location: [facility];  Service: Urology;  Laterality: N/A;    Social History   Socioeconomic History   Marital status: Divorced    Spouse name: Not on file   Number of children: 1   Years of education: 12   Highest education level: Some college, no degree  Occupational History    Employer: [employer]   Occupation: Estate manager/land agent    Comment: [employer]  Tobacco Use   Smoking status: Never   Smokeless tobacco: Never  Vaping Use   Vaping status: Never Used  Substance and Sexual Activity   Alcohol use:  Yes    Alcohol/week: 3.0 - 4.0 standard drinks of alcohol    Types: 3 - 4 Standard drinks or equivalent per week    Comment: several beers and a couple of shots per day   Drug use: Not Currently    Types: Marijuana   Sexual activity: Not on file  Other Topics Concern   Not on file  Social History Narrative   Lives alone   Still working full time at [employer]   Daughter lives 20 miles away   Social Determinants of Health   Financial Resource Strain: Low Risk  (11/15/2022)   Overall Financial Resource Strain (CARDIA)    Difficulty of Paying Living Expenses: Not very hard  Food Insecurity: No Food Insecurity (11/15/2022)   Hunger Vital Sign    Worried About Running Out of Food in the Last Year: Never true    Ran Out of Food in the Last Year: Never true  Transportation Needs: No Transportation Needs (11/15/2022)   PRAPARE - Administrator, Civil Service (Medical): No    Lack of Transportation (Non-Medical): No  Physical Activity: Inactive (11/15/2022)   Exercise Vital Sign  Days of Exercise per Week: 0 days    Minutes of Exercise per Session: 30 min  Stress: Stress Concern Present (11/15/2022)   [employer] of Occupational Health - Occupational Stress Questionnaire    Feeling of Stress : To some extent  Social Connections: Socially Isolated (11/15/2022)   Social Connection and Isolation Panel [NHANES]    Frequency of Communication with Friends and Family: Twice a week    Frequency of Social Gatherings with Friends and Family: Once a week    Attends Religious Services: Never    Database administrator or Organizations: No    Attends Engineer, structural: Never    Marital Status: Divorced    Family History  Problem Relation Age of Onset   Peptic Ulcer Father    Diabetes Paternal Aunt    Colon cancer Neg Hx    Esophageal cancer Neg Hx    Stomach cancer Neg Hx    Colon polyps Neg Hx     Outpatient Encounter Medications as of 03/14/2023   Medication Sig   Accu-Chek Softclix Lancets lancets Check BS 3 times a day Morning, Noon and Bedtime Dx E11.9   Aspirin-Salicylamide-Caffeine (BC HEADACHE POWDER PO) Take 1 packet by mouth daily as needed (Back pain).   atorvastatin (LIPITOR) 80 MG tablet Take 1 tablet (80 mg total) by mouth daily.   Blood Glucose Monitoring Suppl DEVI 1 each by Does not apply route in the morning, at noon, and at bedtime. May substitute to any manufacturer covered by patient's insurance.   Continuous Blood Gluc Receiver (FREESTYLE LIBRE 2 READER) DEVI Use to test blood sugar up to 6 times daily. DX; E11.9   Continuous Blood Gluc Sensor (FREESTYLE LIBRE 2 SENSOR) MISC Use to test blood sugar up to 6 times daily. DX: E11.9   enalapril (VASOTEC) 20 MG tablet TAKE 1 TABLET BY MOUTH EVERY DAY   glucose blood (ACCU-CHEK GUIDE) test strip Check BS 3 times a day Morning, Noon and Bedtime Dx E11.9   Insulin Pen Needle (BD PEN NEEDLE MICRO U/F) 32G X 6 MM MISC Use with injection 6 times daily Dx E10.9   omeprazole (PRILOSEC) 20 MG capsule Take 1 capsule (20 mg total) by mouth daily.   traMADol (ULTRAM) 50 MG tablet Take 2 tablets (100 mg total) by mouth every 12 (twelve) hours as needed.   [DISCONTINUED] Insulin Aspart FlexPen (NOVOLOG) 100 UNIT/ML Inject 10-16 Units into the skin 3 (three) times daily with meals.   [DISCONTINUED] Insulin Degludec FlexTouch 200 UNIT/ML SOPN Inject 36 Units into the skin at bedtime.   Insulin Aspart FlexPen (NOVOLOG) 100 UNIT/ML Inject 20-26 Units into the skin 3 (three) times daily with meals.   Insulin Degludec FlexTouch 200 UNIT/ML SOPN Inject 40 Units into the skin at bedtime.   No facility-administered encounter medications on file as of 03/14/2023.    ALLERGIES: Allergies  Allergen Reactions   Codeine Nausea And Vomiting    VACCINATION STATUS: Immunization History  Administered Date(s) Administered   Fluad Quad(high Dose 65+) 07/27/2020, 03/22/2021   Fluad Trivalent(High  Dose 65+) 02/20/2023   Influenza Split 03/03/2014   Influenza, High Dose Seasonal PF 01/21/2019   Influenza-Unspecified 01/23/2019   Moderna Sars-Covid-2 Vaccination 07/02/2019, 07/22/2019, 07/31/2019, 02/15/2020, 09/18/2020   Pneumococcal Conjugate-13 09/26/2016   Pneumococcal Polysaccharide-23 07/23/2018    Diabetes He presents for his follow-up diabetic visit. He has type 1 diabetes mellitus. Onset time: Diagnosed at approx age of 49. His disease course has been fluctuating. There are no  hypoglycemic associated symptoms. There are no diabetic associated symptoms. There are no hypoglycemic complications. Symptoms are stable. Diabetic complications include heart disease (MI). Risk factors for coronary artery disease include diabetes mellitus, dyslipidemia, family history, male sex, sedentary lifestyle and hypertension. Current diabetic treatment includes intensive insulin program. He is compliant with treatment most of the time (currently on Tresiba 24 units in am and 20 units in pm; Novolog 24-30 units TID). His weight is fluctuating minimally. He is following a generally unhealthy diet. When asked about meal planning, he reported none. He has not had a previous visit with a dietitian. He rarely participates in exercise. His home blood glucose trend is fluctuating minimally. His overall blood glucose range is 140-180 mg/dl. (He presents today with his CGM showing mostly at goal glycemic profile.  His POCT A1c today is 7.5%, increasing slightly from last visit of 7%. He has been injecting more insulin than what is prescribed due to continued elevations in glucose on previous dosage.  He notes he still struggles with healthy diet, drinks 4-5 beers every day when he gets home.  Analysis of his CGM shows TIR 58%, TAR 42%, TBR 0% with a GMI of 7.6%.) An ACE inhibitor/angiotensin II receptor blocker is being taken. He does not see a podiatrist.Eye exam is not current.     Review of  systems  Constitutional: + increasing body weight, current Body mass index is 27.23 kg/m., no fatigue, no subjective hyperthermia, no subjective hypothermia Eyes: no blurry vision, no xerophthalmia ENT: no sore throat, no nodules palpated in throat, no dysphagia/odynophagia, no hoarseness Cardiovascular: no chest pain, no shortness of breath, no palpitations, no leg swelling Respiratory: no cough, no shortness of breath Gastrointestinal: no nausea/vomiting/diarrhea Musculoskeletal: no muscle/joint aches, walks with cane due to disequilibrium Skin: no rashes, no hyperemia Neurological: no tremors, no numbness, no tingling, no dizziness Psychiatric: no depression, no anxiety  Objective:     BP 126/70 (BP Location: Right Arm, Patient Position: Sitting, Cuff Size: Large)   Pulse 80   Ht 6' (1.829 m)   Wt 200 lb 12.8 oz (91.1 kg)   BMI 27.23 kg/m   Wt Readings from Last 3 Encounters:  03/14/23 200 lb 12.8 oz (91.1 kg)  02/20/23 196 lb 6 oz (89.1 kg)  12/10/22 197 lb 3.2 oz (89.4 kg)     BP Readings from Last 3 Encounters:  03/14/23 126/70  02/20/23 117/68  12/10/22 119/78     Physical Exam- Limited  Constitutional:  Body mass index is 27.23 kg/m. , not in acute distress, normal state of mind Eyes:  EOMI, no exophthalmos Musculoskeletal: no gross deformities, strength intact in all four extremities, no gross restriction of joint movements, walks with cane due to disequilibrium Skin:  no rashes, no hyperemia Neurological: no tremor with outstretched hands   Diabetic Foot Exam - Simple   No data filed     CMP ( most recent) CMP     Component Value Date/Time   NA 143 02/27/2023 1002   K 4.9 02/27/2023 1002   CL 103 02/27/2023 1002   CO2 24 02/27/2023 1002   GLUCOSE 130 (H) 02/27/2023 1002   GLUCOSE 180 (H) 08/16/2021 1234   BUN 5 (L) 02/27/2023 1002   CREATININE 0.77 02/27/2023 1002   CALCIUM 9.4 02/27/2023 1002   PROT 6.5 02/27/2023 1002   ALBUMIN 3.9  02/27/2023 1002   AST 32 02/27/2023 1002   ALT 31 02/27/2023 1002   ALKPHOS 78 02/27/2023 1002   BILITOT 0.8  02/27/2023 1002   GFRNONAA 102 07/22/2020 0916   GFRAA 118 07/22/2020 0916     Diabetic Labs (most recent): Lab Results  Component Value Date   HGBA1C 7.5 (A) 03/14/2023   HGBA1C 7.0 (A) 12/10/2022   HGBA1C 6.3 (H) 08/16/2022   MICROALBUR 10mg /L 03/14/2023     Lipid Panel ( most recent) Lipid Panel     Component Value Date/Time   CHOL 184 02/27/2023 1002   TRIG 90 02/27/2023 1002   HDL 81 02/27/2023 1002   CHOLHDL 2.3 02/27/2023 1002   LDLCALC 87 02/27/2023 1002   LABVLDL 16 02/27/2023 1002      Lab Results  Component Value Date   TSH 2.010 02/27/2023   TSH 1.330 09/21/2021           Assessment & Plan:   1) Type 1 diabetes mellitus without complication (HCC)  He presents today with his CGM showing mostly at goal glycemic profile.  His POCT A1c today is 7.5%, increasing slightly from last visit of 7%. He has been injecting more insulin than what is prescribed due to continued elevations in glucose on previous dosage.  He notes he still struggles with healthy diet, drinks 4-5 beers every day when he gets home.  Analysis of his CGM shows TIR 58%, TAR 42%, TBR 0% with a GMI of 7.6%.  - Dustin Graham has currently uncontrolled symptomatic type 1 DM since 72 years of age.   -Recent labs reviewed.  - I had a long discussion with him about the progressive nature of diabetes and the pathology behind its complications. 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 These are all discussed in detail with him.  The following Lifestyle Medicine recommendations according to American College of Lifestyle Medicine [facility]) were discussed and offered to patient and he agrees to start the journey:  A. Whole Foods, Plant-based plate comprising of  fruits and vegetables, plant-based proteins, whole-grain carbohydrates was discussed in detail with the patient.   A list for source of those nutrients were also provided to the patient.  Patient will use only water or unsweetened tea for hydration. B.  The need to stay away from risky substances including alcohol, smoking; obtaining 7 to 9 hours of restorative sleep, at least 150 minutes of moderate intensity exercise weekly, the importance of healthy social connections,  and stress reduction techniques were discussed. C.  A full color page of  Calorie density of various food groups per pound showing examples of each food groups was provided to the patient.  - Nutritional counseling repeated at each appointment due to patients tendency to fall back in to old habits.  - The patient admits there is a room for improvement in their diet and drink choices. -  Suggestion is made for the patient to avoid simple carbohydrates from their diet including Cakes, Sweet Desserts / Pastries, Ice Cream, Soda (diet and regular), Sweet Tea, Candies, Chips, Cookies, Sweet Pastries, Store Bought Juices, Alcohol in Excess of 1-2 drinks a day, Artificial Sweeteners, Coffee Creamer, and "Sugar-free" Products. This will help patient to have stable blood glucose profile and potentially avoid unintended weight gain.   - I encouraged the patient to switch to unprocessed or minimally processed complex starch and increased protein intake (animal or plant source), fruits, and vegetables.   - Patient is advised to stick to a routine mealtimes to eat  3 meals a day and avoid unnecessary snacks (to snack only to correct hypoglycemia).  - I have approached him with the following individualized plan to manage his diabetes and patient agrees:   He is advised to continue his Tresiba 40 units SQ nightly, and Novolog 20-26 units TID with meals (usually only eating 2 meals per day) if glucose is above 90 and he is eating (Specific  instructions on how to titrate insulin dosage based on glucose readings given to patient in writing).  -he is encouraged to continue monitoring glucose 4 times daily (using his CGM), before meals and before bed, and to call the clinic if he has readings less than 70 or above 300 for 3 tests in a row.  - he is warned not to take insulin without proper monitoring per orders. - Adjustment parameters are given to him for hypo and hyperglycemia in writing.  - he is not an ideal candidate of non-insulin therapies given his type 1 diagnosis.  - Specific targets for  A1c; LDL, HDL, and Triglycerides were discussed with the patient.  2) Blood Pressure /Hypertension:  his blood pressure is controlled to target.   he is advised to continue his current medications including Enalapril 20 mg p.o. daily with breakfast.  3) Lipids/Hyperlipidemia:    Review of his recent lipid panel from 02/27/23 showed controlled LDL at 87 .  he is advised to continue Lipitor 40 mg daily at bedtime.  Side effects and precautions discussed with him.  4)  Weight/Diet:  his Body mass index is 27.23 kg/m.  -  clearly complicating his diabetes care.   he is a candidate for weight loss. I discussed with him the fact that loss of 5 - 10% of his  current body weight will have the most impact on his diabetes management.  Exercise, and detailed carbohydrates information provided  -  detailed on discharge instructions.  5) Chronic Care/Health Maintenance: -he is on ACEI/ARB and Statin medications and is encouraged to initiate and continue to follow up with Ophthalmology, Dentist, Podiatrist at least yearly or according to recommendations, and advised to stay away from smoking. I have recommended yearly flu vaccine and pneumonia vaccine at least every 5 years; moderate intensity exercise for up to 150 minutes weekly; and sleep for at least 7 hours a day.  - he is advised to maintain close follow up with Gabriel Earing, FNP for primary  care needs, as well as his other providers for optimal and coordinated care.     I spent  30  minutes in the care of the patient today including review of labs from CMP, Lipids, Thyroid Function, Hematology (current and previous including abstractions from other facilities); face-to-face time discussing  his blood glucose readings/logs, discussing hypoglycemia and hyperglycemia episodes and symptoms, medications doses, his options of short and long term treatment based on the latest standards of care / guidelines;  discussion about incorporating lifestyle medicine;  and documenting the encounter. Risk reduction counseling performed per USPSTF guidelines to reduce obesity and cardiovascular risk factors.     Please refer to Patient Instructions for Blood Glucose Monitoring and Insulin/Medications Dosing Guide"  in media tab for additional information. Please  also refer to " Patient Self Inventory" in the Media  tab for reviewed elements of pertinent patient history.  Dustin Graham participated in the discussions, expressed understanding, and voiced agreement with the above plans.  All questions were answered to his satisfaction. he is encouraged to contact clinic should he  have any questions or concerns prior to his return visit.     Follow up plan: - Return in about 3 months (around 06/14/2023) for Diabetes F/U with A1c in office, No previsit labs, Bring meter and logs.   Ronny Bacon, [facility] [address] [place], [place] [address] Phone: [phone] Fax: [phone]  03/14/2023, 1:44 PM

## 2023-03-17 ENCOUNTER — Other Ambulatory Visit: Payer: Self-pay | Admitting: Family Medicine

## 2023-03-17 DIAGNOSIS — E1159 Type 2 diabetes mellitus with other circulatory complications: Secondary | ICD-10-CM

## 2023-03-17 DIAGNOSIS — I152 Hypertension secondary to endocrine disorders: Secondary | ICD-10-CM

## 2023-03-25 ENCOUNTER — Other Ambulatory Visit: Payer: Medicare Other | Admitting: Urology

## 2023-04-11 ENCOUNTER — Other Ambulatory Visit: Payer: Self-pay | Admitting: Family Medicine

## 2023-04-11 DIAGNOSIS — I152 Hypertension secondary to endocrine disorders: Secondary | ICD-10-CM

## 2023-04-11 DIAGNOSIS — E782 Mixed hyperlipidemia: Secondary | ICD-10-CM

## 2023-04-11 DIAGNOSIS — E1159 Type 2 diabetes mellitus with other circulatory complications: Secondary | ICD-10-CM

## 2023-04-18 ENCOUNTER — Encounter: Payer: Self-pay | Admitting: Nurse Practitioner

## 2023-04-19 ENCOUNTER — Ambulatory Visit: Payer: Medicare Other | Admitting: Urology

## 2023-04-19 VITALS — BP 130/75 | HR 97

## 2023-04-19 DIAGNOSIS — C671 Malignant neoplasm of dome of bladder: Secondary | ICD-10-CM

## 2023-04-19 DIAGNOSIS — Z8551 Personal history of malignant neoplasm of bladder: Secondary | ICD-10-CM | POA: Diagnosis not present

## 2023-04-19 LAB — URINALYSIS, ROUTINE W REFLEX MICROSCOPIC
Bilirubin, UA: NEGATIVE
Glucose, UA: NEGATIVE
Ketones, UA: NEGATIVE
Leukocytes,UA: NEGATIVE
Nitrite, UA: NEGATIVE
Protein,UA: NEGATIVE
RBC, UA: NEGATIVE
Specific Gravity, UA: 1.025 (ref 1.005–1.030)
Urobilinogen, Ur: 1 mg/dL (ref 0.2–1.0)
pH, UA: 6 (ref 5.0–7.5)

## 2023-04-19 MED ORDER — CIPROFLOXACIN HCL 500 MG PO TABS
500.0000 mg | ORAL_TABLET | Freq: Once | ORAL | Status: DC
Start: 2023-04-19 — End: 2023-06-07

## 2023-04-19 NOTE — Progress Notes (Unsigned)
   04/19/23  CC: followup bladder cancer   HPI: Dustin Graham is a 71yo here for followup for bladder cancer Blood pressure 130/75, pulse 97. NED. A&Ox3.   No respiratory distress   Abd soft, NT, ND Normal phallus with bilateral descended testicles  Cystoscopy Procedure Note  Patient identification was confirmed, informed consent was obtained, and patient was prepped using Betadine solution.  Lidocaine jelly was administered per urethral meatus.     Pre-Procedure: - Inspection reveals a normal caliber ureteral meatus.  Procedure: The flexible cystoscope was introduced without difficulty - No urethral strictures/lesions are present. - Enlarged prostate  - Normal bladder neck - Bilateral ureteral orifices identified - Bladder mucosa  reveals no ulcers, tumors, or lesions - No bladder stones - No trabeculation   Post-Procedure: - Patient tolerated the procedure well  Assessment/ Plan: Followup 3 months with cystoscopy  No follow-ups on file.  Wilkie Aye, MD

## 2023-04-22 ENCOUNTER — Telehealth: Payer: Self-pay

## 2023-04-22 NOTE — Telephone Encounter (Signed)
Additional information has been requested from [employer] in order to proceed with continuation of care. Requested information has been sent, or form has been filled out and faxed back to [phone]

## 2023-04-23 ENCOUNTER — Encounter: Payer: Self-pay | Admitting: Urology

## 2023-04-23 NOTE — Patient Instructions (Signed)

## 2023-05-12 ENCOUNTER — Other Ambulatory Visit: Payer: Self-pay | Admitting: Family Medicine

## 2023-05-12 DIAGNOSIS — I152 Hypertension secondary to endocrine disorders: Secondary | ICD-10-CM

## 2023-05-12 DIAGNOSIS — E1159 Type 2 diabetes mellitus with other circulatory complications: Secondary | ICD-10-CM

## 2023-05-13 ENCOUNTER — Telehealth: Payer: Self-pay

## 2023-05-13 DIAGNOSIS — I152 Hypertension secondary to endocrine disorders: Secondary | ICD-10-CM

## 2023-05-13 DIAGNOSIS — E1159 Type 2 diabetes mellitus with other circulatory complications: Secondary | ICD-10-CM

## 2023-05-13 MED ORDER — ENALAPRIL MALEATE 20 MG PO TABS: 20.0000 mg | ORAL_TABLET | Freq: Every day | ORAL | 0 refills | Status: DC

## 2023-05-13 NOTE — Telephone Encounter (Signed)
Copied from CRM [id number]. Topic: Clinical - Medication Refill >> May 13, 2023  9:28 AM Donita Brooks wrote: Most Recent Primary Care Visit:  Provider: [facility]  Department: [facility]  Visit Type: LAB  Date: 02/27/2023  Medication: enalapril (VASOTEC) 20 MG tablet   Has the patient contacted their pharmacy? Yes (Agent: If no, request that the patient contact the pharmacy for the refill. If patient does not wish to contact the pharmacy document the reason why and proceed with request.) (Agent: If yes, when and what did the pharmacy advise?)  Is this the correct pharmacy for this prescription? Yes If no, delete pharmacy and type the correct one.  This is the patient's preferred pharmacy:  [id number] - [place], [place] - [address] [place] [address] Phone: [phone] Fax: [phone]  [id number] - [place], [place] - [address] [place] [address] Phone: [phone] Fax: [phone]   Has the prescription been filled recently? No  Is the patient out of the medication? No  Has the patient been seen for an appointment in the last year OR does the patient have an upcoming appointment? No  Can we respond through MyChart? Yes  Agent: Please be advised that Rx refills may take up to 3 business days. We ask that you follow-up with your pharmacy.

## 2023-05-13 NOTE — Telephone Encounter (Signed)
Patient informed medication has been sent to pharmacy.

## 2023-05-13 NOTE — Addendum Note (Signed)
Addended by: Quay Burow on: 05/13/2023 11:48 AM   Modules accepted: Orders

## 2023-05-30 ENCOUNTER — Ambulatory Visit: Payer: Medicare Other | Admitting: Family Medicine

## 2023-06-07 ENCOUNTER — Ambulatory Visit: Payer: Medicare Other | Admitting: Family Medicine

## 2023-06-07 VITALS — BP 136/73 | HR 93 | Temp 97.6°F | Ht 72.0 in | Wt 200.4 lb

## 2023-06-07 DIAGNOSIS — M47812 Spondylosis without myelopathy or radiculopathy, cervical region: Secondary | ICD-10-CM

## 2023-06-07 DIAGNOSIS — M47816 Spondylosis without myelopathy or radiculopathy, lumbar region: Secondary | ICD-10-CM

## 2023-06-07 DIAGNOSIS — K219 Gastro-esophageal reflux disease without esophagitis: Secondary | ICD-10-CM

## 2023-06-07 DIAGNOSIS — C671 Malignant neoplasm of dome of bladder: Secondary | ICD-10-CM

## 2023-06-07 DIAGNOSIS — E1065 Type 1 diabetes mellitus with hyperglycemia: Secondary | ICD-10-CM

## 2023-06-07 DIAGNOSIS — E782 Mixed hyperlipidemia: Secondary | ICD-10-CM

## 2023-06-07 DIAGNOSIS — E1069 Type 1 diabetes mellitus with other specified complication: Secondary | ICD-10-CM | POA: Insufficient documentation

## 2023-06-07 DIAGNOSIS — Z79899 Other long term (current) drug therapy: Secondary | ICD-10-CM

## 2023-06-07 DIAGNOSIS — E1159 Type 2 diabetes mellitus with other circulatory complications: Secondary | ICD-10-CM

## 2023-06-07 DIAGNOSIS — J0110 Acute frontal sinusitis, unspecified: Secondary | ICD-10-CM

## 2023-06-07 DIAGNOSIS — K709 Alcoholic liver disease, unspecified: Secondary | ICD-10-CM

## 2023-06-07 MED ORDER — ATORVASTATIN CALCIUM 80 MG PO TABS: 80.0000 mg | ORAL_TABLET | Freq: Every day | ORAL | 3 refills | Status: DC

## 2023-06-07 MED ORDER — AMOXICILLIN-POT CLAVULANATE 875-125 MG PO TABS: 1.0000 | ORAL_TABLET | Freq: Two times a day (BID) | ORAL | 0 refills | Status: AC

## 2023-06-07 MED ORDER — LEVOCETIRIZINE DIHYDROCHLORIDE 5 MG PO TABS: 5.0000 mg | ORAL_TABLET | Freq: Every evening | ORAL | 0 refills | Status: DC

## 2023-06-07 MED ORDER — TRAMADOL HCL 50 MG PO TABS: 100.0000 mg | ORAL_TABLET | Freq: Two times a day (BID) | ORAL | 2 refills | Status: DC | PRN

## 2023-06-07 NOTE — Assessment & Plan Note (Signed)
 Manageable with tramadol. PDMP reviewed, no red flags. UDS and CSA are UTD. Refills provided.

## 2023-06-07 NOTE — Assessment & Plan Note (Signed)
 Well controlled on current regimen. Continue PPI.

## 2023-06-07 NOTE — Assessment & Plan Note (Signed)
 On statin. Last LDL 87. Diet, exercise, weight loss

## 2023-06-07 NOTE — Progress Notes (Signed)
 Established Patient Office Visit  Subjective   Patient ID: Dustin Graham, male    DOB: [birth year]  Age: 73 y.o. MRN: [id number]  Chief Complaint  Patient presents with   Medical Management of Chronic Issues    HPI DM Managed by endo. Last A1c 7.5. Working on adjusting insulins.   2. HTN Complaint with meds - Yes Current Medications - enalapril  20 mg daily Pertinent ROS:  Visual Disturbances - No Chest pain - No Dyspnea - No Palpitations - No LE edema - No   3. GERD Compliant with medications - Yes Current medications - prilosec 20 mg  Cough - No Sore throat - No Voice change - No Hemoptysis - No Dysphagia or dyspepsia - No Water  brash - No Red Flags (weight loss, hematochezia, melena, weight loss, early satiety, fevers, odynophagia, or persistent vomiting) - No  4.  Sinus issues Congestion for 2 weeks, no improvement. Sinus pressure around nose. Denies cough, sore throat, ear pain, shortness of breath, wheezing. Has tried nyquil without improvement.   5. Pain Cause of pain- history of a kyphoplasty at T12 due to a compression fracture.    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 Mild bilateral bony foraminal narrowing at those levels.     Pain location- back, neck pain Pain on scale of 1-10- 7/10 without medication and 4-5/10 with medication Frequency- daily. He does not take tramadol  daily however.  What increases pain- pain is constant What makes pain better- medication Effects on ADL- makes it slower Any change in general medical condition- no Current opioids rx- Tramadol  # meds rx- 60 Effectiveness of current meds- very good Adverse reactions from pain meds- none Morphine equivalent- 20 MME/day   Pill count performed-No Last drug screen -  11/19/22 ( high risk q30m, moderate risk q22m, low risk yearly ) Urine drug screen today- Yes Was the NCCSR reviewed- Yes             If yes were their any concerning findings? - No   Overdose risk: 190       10/19/2021    3:49 PM  Opioid Risk   Alcohol 3  Illegal Drugs 0  Rx Drugs 0  Alcohol 0  Illegal Drugs 0  Rx Drugs 0  Age between 16-45 years  0  History of Preadolescent Sexual Abuse 0  Psychological Disease 0  Depression 0  Opioid Risk Tool Scoring 3  Opioid Risk Interpretation Low Risk     Past Medical History:  Diagnosis Date   Arthritis    Bladder cancer (HCC) 02/16/2022   GERD (gastroesophageal reflux disease)    HLD (hyperlipidemia)    Hypertension associated with diabetes (HCC) 02/29/2016   Rosacea 01/28/2019   Type 1 diabetes (HCC)       ROS As per HPI.   Objective:     BP 136/73   Pulse 93   Temp 97.6 F (36.4 C) (Temporal)   Ht 6' (1.829 m)   Wt 200 lb 6.4 oz (90.9 kg)   SpO2 96%   BMI 27.18 kg/m  Wt Readings from Last 3 Encounters:  06/07/23 200 lb 6.4 oz (90.9 kg)  03/14/23 200 lb 12.8 oz (91.1 kg)  02/20/23 196 lb 6 oz (89.1 kg)    Physical Exam Vitals and nursing note reviewed.  Constitutional:      General: He is not in acute distress.    Appearance: He is not ill-appearing, toxic-appearing or diaphoretic.  HENT:     Head: Normocephalic and atraumatic.     Right Ear: Ear canal and external ear normal. A middle ear effusion is present. Tympanic membrane is not perforated, erythematous, retracted or bulging.     Left Ear: Ear canal and external ear normal. A middle ear effusion is present. Tympanic membrane is not perforated, erythematous, retracted or bulging.     Nose: Congestion present.     Right Sinus: Frontal sinus tenderness present. No maxillary sinus tenderness.     Left Sinus: Frontal sinus tenderness present. No maxillary sinus tenderness.     Mouth/Throat:     Mouth: Mucous membranes are moist.     Pharynx: Oropharynx  is clear. No oropharyngeal exudate or posterior oropharyngeal erythema.  Eyes:     General: No scleral icterus.       Right eye: No discharge.        Left eye: No discharge.     Extraocular Movements: Extraocular movements intact.     Pupils: Pupils are equal, round, and reactive to light.  Neck:     Thyroid: No thyroid mass, thyromegaly or thyroid tenderness.     Vascular: No carotid bruit.  Cardiovascular:     Rate and Rhythm: Normal rate and regular rhythm.     Heart sounds: Normal heart sounds. No murmur heard. Pulmonary:     Effort: Pulmonary effort is normal.     Breath sounds: Normal breath sounds.  Abdominal:     General: Bowel sounds are normal. There is no distension.     Tenderness: There is no abdominal tenderness. There is no guarding or rebound.  Musculoskeletal:     Cervical back: No rigidity.     Right lower leg: No edema.     Left lower leg: No edema.  Skin:    General: Skin is warm and dry.     Coloration: Skin is not jaundiced.  Neurological:     General: No focal deficit present.     Mental Status: He is alert and oriented to person, place, and time.  Psychiatric:        Mood and Affect: Mood normal.        Behavior: Behavior normal.        Thought Content: Thought content normal.        Judgment: Judgment normal.     No results found for any visits on 06/07/23.    The 10-year ASCVD risk score (Arnett DK, et al., 2019) is: 36.7%    Assessment & Plan:   Type 1 diabetes mellitus with hyperglycemia [facility]) Assessment & Plan: Managed by endocrinology. Last A1c 7.5   Mixed diabetic hyperlipidemia associated with type 1 diabetes mellitus (HCC) Assessment & Plan: On statin. Last LDL 87. Diet, exercise, weight loss   Hypertension associated with diabetes (HCC) Assessment & Plan: Well controlled on current regimen.    Mixed hyperlipidemia -     Atorvastatin  Calcium ; Take 1 tablet (80 mg total) by mouth daily.  Dispense: 90 tablet; Refill:  3  Cervical spondylosis Assessment & Plan: Manageable with tramadol . PDMP reviewed, no red flags. UDS and CSA are UTD. Refills provided.   Orders: -     traMADol  HCl; Take 2 tablets (100 mg total) by mouth every 12 (twelve) hours as needed.  Dispense: 60 tablet; Refill: 2  Spondylosis of lumbar spine Assessment &  Plan: Manageable with tramadol . PDMP reviewed, no red flags. UDS and CSA are UTD. Refills provided.   Orders: -     traMADol  HCl; Take 2 tablets (100 mg total) by mouth every 12 (twelve) hours as needed.  Dispense: 60 tablet; Refill: 2  Controlled substance agreement signed -     traMADol  HCl; Take 2 tablets (100 mg total) by mouth every 12 (twelve) hours as needed.  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 Last saw GI in 2022. LFTs have been stable.   Gastroesophageal reflux disease without esophagitis Assessment & Plan: Well controlled on current regimen. Continue PPI.   Acute non-recurrent frontal sinusitis Discussed symptomatic care and return precautions.  -     Levocetirizine Dihydrochloride ; Take 1 tablet (5 mg total) by mouth every evening.  Dispense: 30 tablet; Refill: 0 -     Amoxicillin -Pot Clavulanate; Take 1 tablet by mouth 2 (two) times daily for 7 days.  Dispense: 14 tablet; Refill: 0    Return in about 3 months (around 09/05/2023) for chronic follow up.   The patient indicates understanding of these issues and agrees with the plan.  Annabella CHRISTELLA Search, FNP

## 2023-06-07 NOTE — Assessment & Plan Note (Signed)
 Managed by endocrinology. Last A1c 7.5

## 2023-06-07 NOTE — Assessment & Plan Note (Addendum)
 With fibrosis. Last saw GI in 2022. LFTs have been stable.

## 2023-06-07 NOTE — Assessment & Plan Note (Signed)
 Well-controlled on current regimen. ?

## 2023-06-07 NOTE — Assessment & Plan Note (Deleted)
 On statin. Last LDL 87. Diet, exercise, weight loss

## 2023-06-07 NOTE — Assessment & Plan Note (Signed)
 Managed by urology

## 2023-06-20 ENCOUNTER — Ambulatory Visit: Payer: Medicare Other | Admitting: Nurse Practitioner

## 2023-06-30 ENCOUNTER — Other Ambulatory Visit: Payer: Self-pay | Admitting: Family Medicine

## 2023-06-30 DIAGNOSIS — J0110 Acute frontal sinusitis, unspecified: Secondary | ICD-10-CM

## 2023-07-22 ENCOUNTER — Ambulatory Visit: Payer: Medicare Other | Admitting: Urology

## 2023-07-22 VITALS — BP 169/82 | HR 105

## 2023-07-22 DIAGNOSIS — Z8551 Personal history of malignant neoplasm of bladder: Secondary | ICD-10-CM | POA: Diagnosis not present

## 2023-07-22 DIAGNOSIS — C671 Malignant neoplasm of dome of bladder: Secondary | ICD-10-CM

## 2023-07-22 LAB — URINALYSIS, ROUTINE W REFLEX MICROSCOPIC
Bilirubin, UA: NEGATIVE
Leukocytes,UA: NEGATIVE
Nitrite, UA: NEGATIVE
Protein,UA: NEGATIVE
RBC, UA: NEGATIVE
Specific Gravity, UA: 1.02 (ref 1.005–1.030)
Urobilinogen, Ur: 0.2 mg/dL (ref 0.2–1.0)
pH, UA: 5.5 (ref 5.0–7.5)

## 2023-07-22 MED ORDER — CIPROFLOXACIN HCL 500 MG PO TABS
500.0000 mg | ORAL_TABLET | Freq: Once | ORAL | Status: AC
  Administered 2023-07-22: 500 mg via ORAL

## 2023-07-22 NOTE — Progress Notes (Unsigned)
   07/22/23  CC: bladder cancer   HPI: Dustin Graham is a 72yo here for followup for bladder cancer Blood pressure (!) 169/82, pulse (!) 105. NED. A&Ox3.   No respiratory distress   Abd soft, NT, ND Normal phallus with bilateral descended testicles  Cystoscopy Procedure Note  Patient identification was confirmed, informed consent was obtained, and patient was prepped using Betadine solution.  Lidocaine jelly was administered per urethral meatus.     Pre-Procedure: - Inspection reveals a normal caliber ureteral meatus.  Procedure: The flexible cystoscope was introduced without difficulty - No urethral strictures/lesions are present. - Enlarged prostate  - Normal bladder neck - Bilateral ureteral orifices identified - Bladder mucosa  reveals no ulcers, tumors, or lesions - No bladder stones - No trabeculation   Post-Procedure: - Patient tolerated the procedure well  Assessment/ Plan: Followup 3 months for cystoscopy  No follow-ups on file.  Wilkie Aye, MD

## 2023-07-25 ENCOUNTER — Encounter: Payer: Self-pay | Admitting: Urology

## 2023-07-25 NOTE — Patient Instructions (Signed)

## 2023-08-11 ENCOUNTER — Other Ambulatory Visit: Payer: Self-pay | Admitting: Family Medicine

## 2023-08-11 DIAGNOSIS — E1159 Type 2 diabetes mellitus with other circulatory complications: Secondary | ICD-10-CM

## 2023-08-11 DIAGNOSIS — I152 Hypertension secondary to endocrine disorders: Secondary | ICD-10-CM

## 2023-08-27 IMAGING — US US ABDOMEN COMPLETE W/ ELASTOGRAPHY
2 series · 12 of 25 positions shown · non-contrast
Comparison: 10/26/2020.

CLINICAL DATA: Epigastric pain.

EXAM:
ULTRASOUND ABDOMEN
ULTRASOUND HEPATIC ELASTOGRAPHY
TECHNIQUE: Sonography of the upper abdomen was performed. In addition,
ultrasound elastography evaluation of the liver was performed. A
region of interest was placed within the right lobe of the liver.
Following application of a compressive sonographic pulse, tissue
compressibility was assessed. Multiple assessments were performed at
the selected site. Median tissue compressibility was determined.
Previously, hepatic stiffness was assessed by shear wave velocity.
Based on recently published Society of Radiologists in Ultrasound
consensus article, reporting is now recommended to be performed in
the SI units of pressure (kiloPascals) representing hepatic
stiffness/elasticity. The obtained result is compared to the
published reference standards. (cACLD = compensated Advanced Chronic
Liver Disease)

[Series 1: us abdomen complete w/elastography · 10 of 73 slices shown]
[im 4/73]
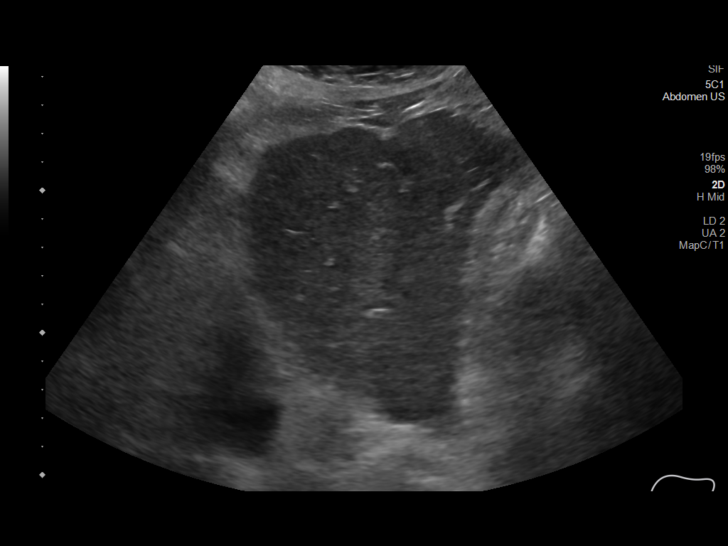
[im 11/73]
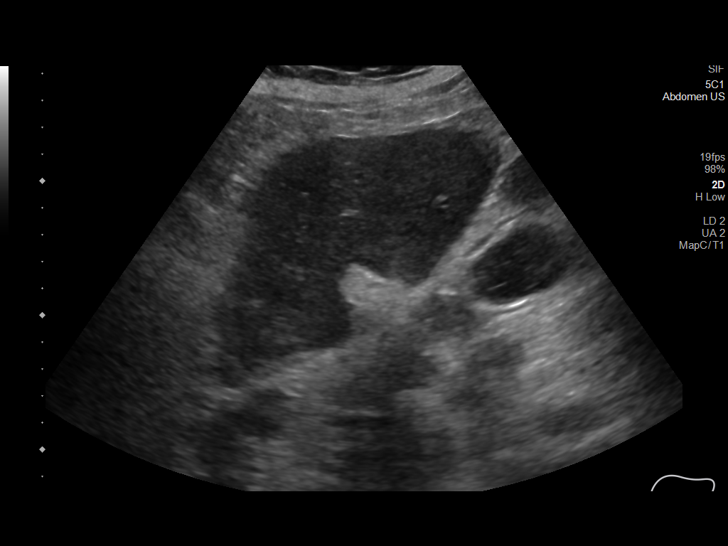
[im 19/73]
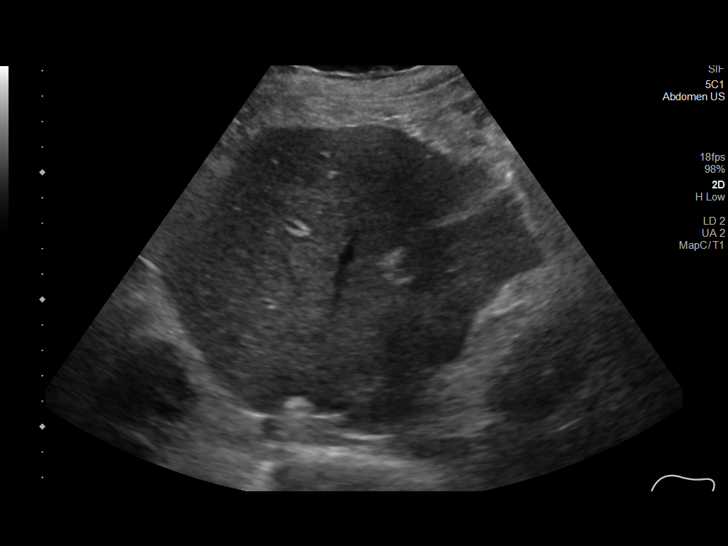
[im 26/73]
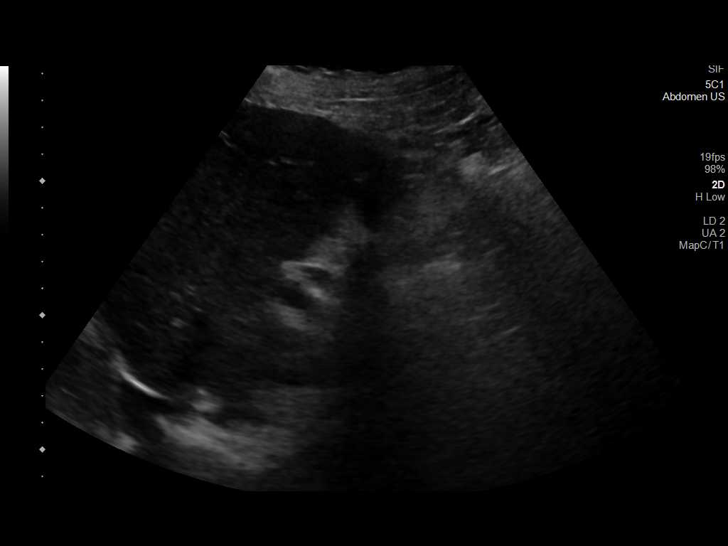
[im 33/73]
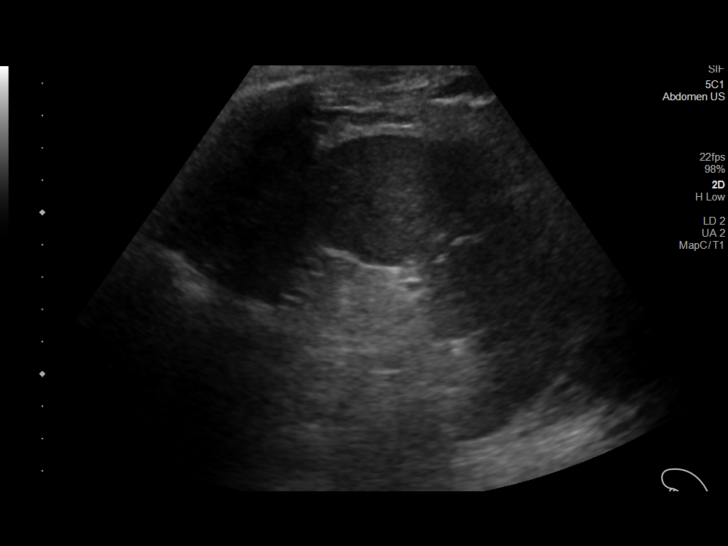
[im 40/73]
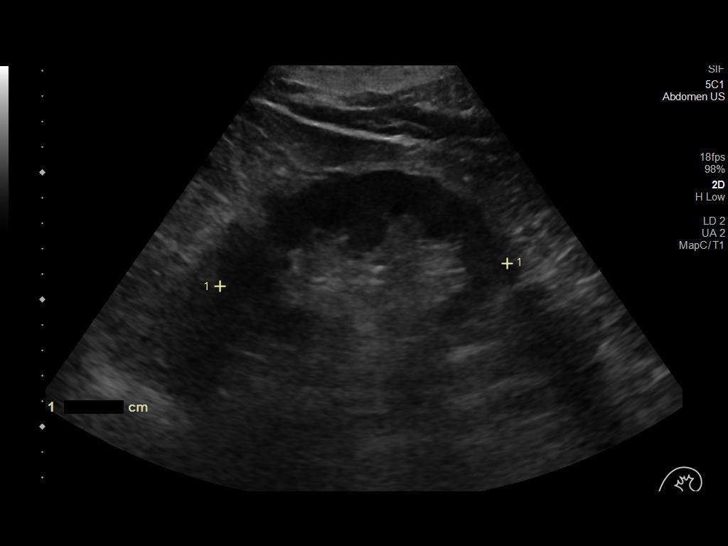
[im 47/73]
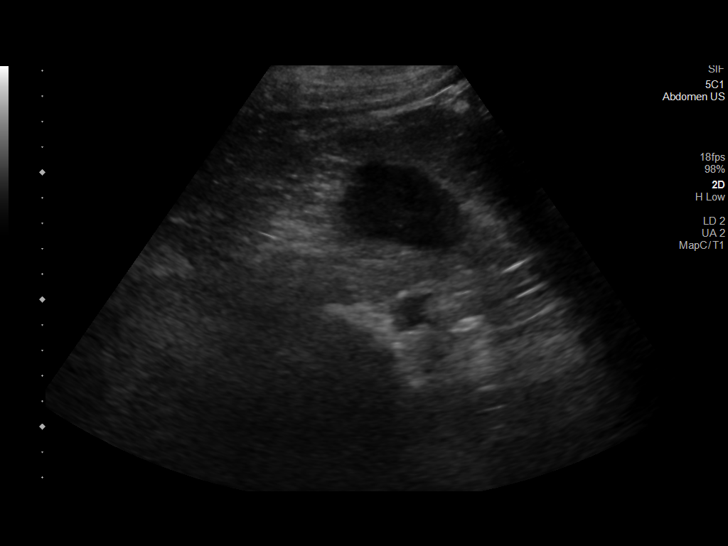
[im 55/73]
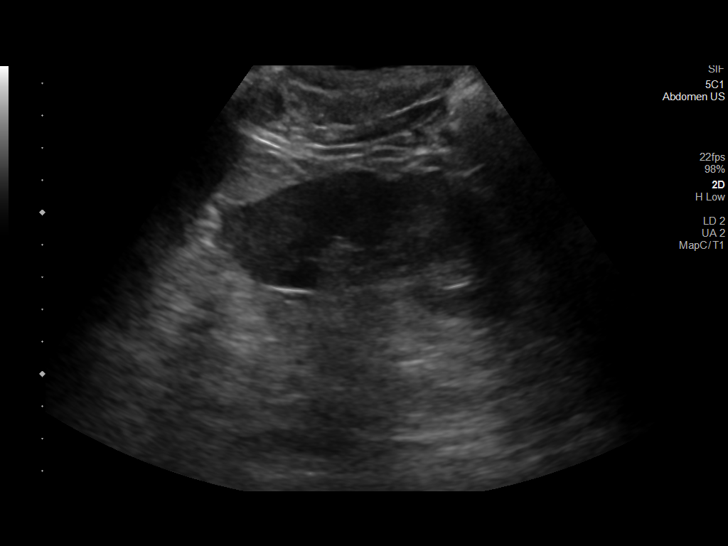
[im 62/73]
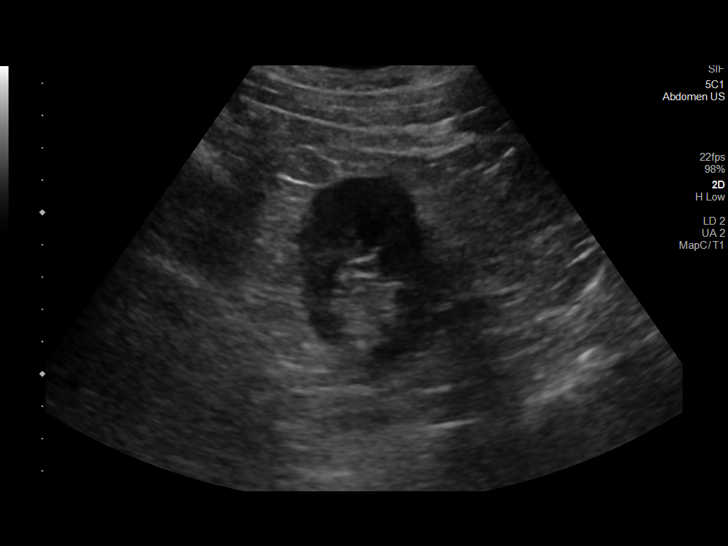
[im 69/73]
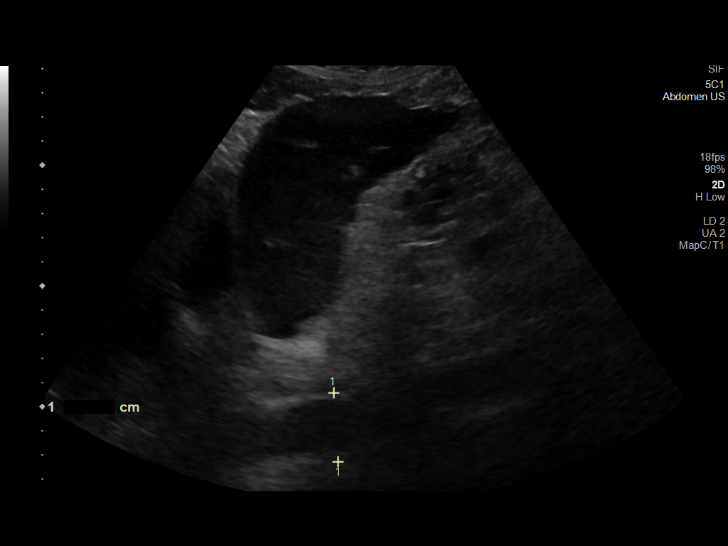

[Series 1002: abdomen us · 2 of 15 slices shown]
[im 1/15]
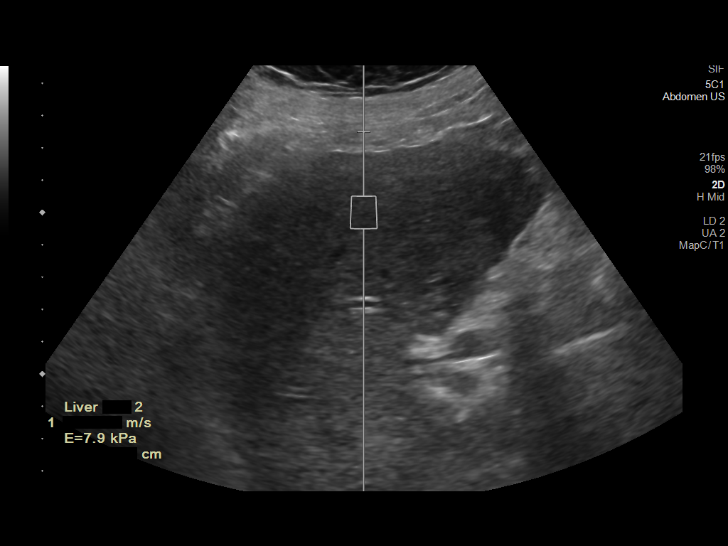
[im 10/15]
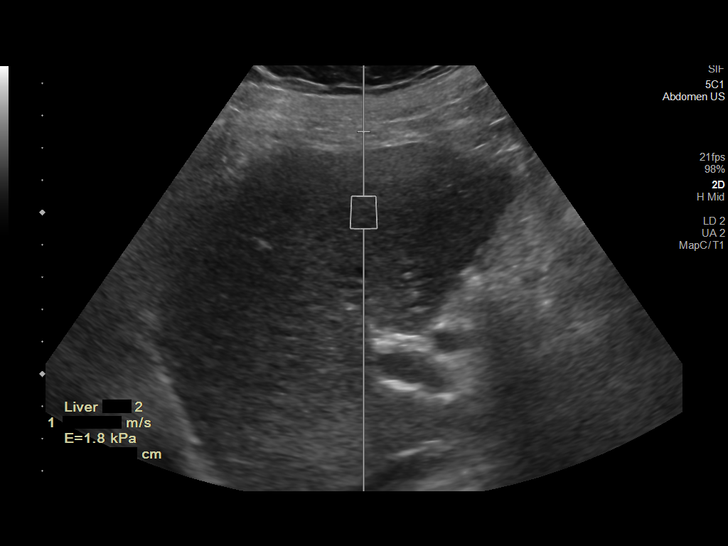

[12 of 25 positions shown; findings below may reference images not displayed]

FINDINGS: ULTRASOUND ABDOMEN

Gallbladder: Surgically absent.

Common bile duct: Diameter: 7 mm

Liver: No focal lesion identified. Diffusely increased parenchymal
echogenicity. Portal vein is patent on color Doppler imaging with
normal direction of blood flow towards the liver.

IVC: No abnormality visualized.

Pancreas: Visualized portion unremarkable.

Spleen: Size and appearance within normal limits.

Right Kidney: Length: 11.3 cm. Echogenicity within normal limits. No
mass or hydronephrosis visualized.

Left Kidney: Length: 11.5 cm. Echogenicity within normal limits. No
mass or hydronephrosis visualized.

Abdominal aorta: No aneurysm visualized.

Other findings: None.

ULTRASOUND HEPATIC ELASTOGRAPHY

Device: Siemens Helix VTQ

Patient position: Oblique

Transducer [id number]

Number of measurements: 10

Hepatic segment:  8

Median kPa:

IQR:

IQR/Median kPa ratio:

Data quality: IQR/Median kPa ratio of 0.3 or greater indicates
reduced accuracy

Diagnostic category:  < or = 5 kPa: high probability of being normal

The use of hepatic elastography is applicable to patients with viral
hepatitis and non-alcoholic fatty liver disease. At this time, there
is insufficient data for the referenced cut-off values and use in
other causes of liver disease, including alcoholic liver disease.
Patients, however, may be assessed by elastography and serve as
their own reference standard/baseline.

In patients with non-alcoholic liver disease, the values suggesting
compensated advanced chronic liver disease (cACLD) may be lower, and
patients may need additional testing with elasticity results of [DATE]
kPa.

Please note that abnormal hepatic elasticity and shear wave
velocities may also be identified in clinical settings other than
with hepatic fibrosis, such as: acute hepatitis, elevated right
heart and central venous pressures including use of beta blockers,
Alberth disease (Garg), infiltrative processes such as
mastocytosis/amyloidosis/infiltrative tumor/lymphoma, extrahepatic
cholestasis, with hyperemia in the post-prandial state, and with
liver transplantation. Correlation with patient history, laboratory
data, and clinical condition recommended.

Diagnostic Categories:

< or =5 kPa: high probability of being normal

< or =9 kPa: in the absence of other known clinical signs, rules [DATE] kPa and ?13 kPa: suggestive of cACLD, but needs further testing

>13 kPa: highly suggestive of cACLD

> or =17 kPa: highly suggestive of cACLD with an increased
probability of clinically significant portal hypertension
IMPRESSION: ULTRASOUND ABDOMEN:

Diffusely increased parenchymal echogenicity, nonspecific but most
commonly seen with hepatic steatosis or hepatocellular disease. No
suspicious hepatic lesion visualized.

ULTRASOUND HEPATIC ELASTOGRAPHY:

Median kPa:

Diagnostic category:  < or = 5 kPa: high probability of being normal

## 2023-08-29 ENCOUNTER — Ambulatory Visit (INDEPENDENT_AMBULATORY_CARE_PROVIDER_SITE_OTHER): Payer: Medicare Other | Admitting: Nurse Practitioner

## 2023-08-29 ENCOUNTER — Other Ambulatory Visit: Payer: Self-pay | Admitting: Nurse Practitioner

## 2023-08-29 ENCOUNTER — Encounter: Payer: Self-pay | Admitting: Nurse Practitioner

## 2023-08-29 VITALS — BP 132/84 | HR 80 | Ht 72.0 in | Wt 207.4 lb

## 2023-08-29 DIAGNOSIS — E1065 Type 1 diabetes mellitus with hyperglycemia: Secondary | ICD-10-CM

## 2023-08-29 DIAGNOSIS — E109 Type 1 diabetes mellitus without complications: Secondary | ICD-10-CM | POA: Diagnosis not present

## 2023-08-29 DIAGNOSIS — Z794 Long term (current) use of insulin: Secondary | ICD-10-CM | POA: Diagnosis not present

## 2023-08-29 DIAGNOSIS — E559 Vitamin D deficiency, unspecified: Secondary | ICD-10-CM | POA: Diagnosis not present

## 2023-08-29 LAB — POCT GLYCOSYLATED HEMOGLOBIN (HGB A1C): Hemoglobin A1C: 7.2 % — AB (ref 4.0–5.6)

## 2023-08-29 MED ORDER — INSULIN DEGLUDEC FLEXTOUCH 200 UNIT/ML ~~LOC~~ SOPN
44.0000 [IU] | PEN_INJECTOR | Freq: Every evening | SUBCUTANEOUS | 3 refills | Status: DC
Start: 2023-08-29 — End: 2023-08-30

## 2023-08-29 MED ORDER — INSULIN ASPART FLEXPEN 100 UNIT/ML ~~LOC~~ SOPN: 20.0000 [IU] | PEN_INJECTOR | Freq: Three times a day (TID) | SUBCUTANEOUS | 3 refills | Status: DC

## 2023-08-29 MED ORDER — BD PEN NEEDLE MICRO U/F 32G X 6 MM MISC: 3 refills | Status: DC

## 2023-08-29 NOTE — Progress Notes (Signed)
 Endocrinology Follow Up Note       08/29/2023, 1:51 PM   Subjective:    Patient ID: Manas Hickling, male    DOB: 05-16-1951.  Quamaine Webb is being seen in follow up after being seen in consultation for management of currently uncontrolled symptomatic diabetes requested by  Gabriel Earing, FNP.   Past Medical History:  Diagnosis Date   Arthritis    Bladder cancer (HCC) 02/16/2022   GERD (gastroesophageal reflux disease)    HLD (hyperlipidemia)    Hypertension associated with diabetes (HCC) 02/29/2016   Rosacea 01/28/2019   Type 1 diabetes (HCC)     Past Surgical History:  Procedure Laterality Date   broken nose     CHOLECYSTECTOMY     CYSTOSCOPY W/ RETROGRADES Bilateral 02/15/2022   Procedure: CYSTOSCOPY WITH RETROGRADE PYELOGRAM;  Surgeon: Malen Gauze, MD;  Location: [facility];  Service: Urology;  Laterality: Bilateral;  pt knows to arrive at 8:00   GALLBLADDER SURGERY     LUMBAR DISC SURGERY     fracture   TRANSURETHRAL RESECTION OF BLADDER TUMOR N/A 02/15/2022   Procedure: TRANSURETHRAL RESECTION OF BLADDER TUMOR (TURBT);  Surgeon: Malen Gauze, MD;  Location: [facility];  Service: Urology;  Laterality: N/A;   TRANSURETHRAL RESECTION OF BLADDER TUMOR N/A 03/22/2022   Procedure: TRANSURETHRAL RESECTION OF BLADDER TUMOR (TURBT);  Surgeon: Malen Gauze, MD;  Location: [facility];  Service: Urology;  Laterality: N/A;    Social History   Socioeconomic History   Marital status: Divorced    Spouse name: Not on file   Number of children: 1   Years of education: 12   Highest education level: Some college, no degree  Occupational History    Employer: [employer]   Occupation: Estate manager/land agent    Comment: [employer]  Tobacco Use   Smoking status: Never   Smokeless tobacco: Never  Vaping Use   Vaping status: Never Used  Substance and Sexual Activity   Alcohol use: Yes     Alcohol/week: 3.0 - 4.0 standard drinks of alcohol    Types: 3 - 4 Standard drinks or equivalent per week    Comment: several beers and a couple of shots per day   Drug use: Not Currently    Types: Marijuana   Sexual activity: Not on file  Other Topics Concern   Not on file  Social History Narrative   Lives alone   Still working full time at [employer]   Daughter lives 20 miles away   Social Drivers of Health   Financial Resource Strain: Low Risk  (06/07/2023)   Overall Financial Resource Strain (CARDIA)    Difficulty of Paying Living Expenses: Not very hard  Food Insecurity: No Food Insecurity (06/07/2023)   Hunger Vital Sign    Worried About Running Out of Food in the Last Year: Never true    Ran Out of Food in the Last Year: Never true  Transportation Needs: No Transportation Needs (06/07/2023)   PRAPARE - Administrator, Civil Service (Medical): No    Lack of Transportation (Non-Medical): No  Physical Activity: Inactive (06/07/2023)   Exercise Vital Sign  Days of Exercise per Week: 0 days    Minutes of Exercise per Session: 30 min  Stress: No Stress Concern Present (06/07/2023)   [employer] of Occupational Health - Occupational Stress Questionnaire    Feeling of Stress : Only a little  Social Connections: Socially Isolated (06/07/2023)   Social Connection and Isolation Panel [NHANES]    Frequency of Communication with Friends and Family: Once a week    Frequency of Social Gatherings with Friends and Family: Once a week    Attends Religious Services: Never    Database administrator or Organizations: No    Attends Engineer, structural: Never    Marital Status: Divorced    Family History  Problem Relation Age of Onset   Peptic Ulcer Father    Diabetes Paternal Aunt    Colon cancer Neg Hx    Esophageal cancer Neg Hx    Stomach cancer Neg Hx    Colon polyps Neg Hx     Outpatient Encounter Medications as of 08/29/2023  Medication Sig    Accu-Chek Softclix Lancets lancets Check BS 3 times a day Morning, Noon and Bedtime Dx E11.9   Aspirin-Salicylamide-Caffeine (BC HEADACHE POWDER PO) Take 1 packet by mouth daily as needed (Back pain).   atorvastatin (LIPITOR) 80 MG tablet Take 1 tablet (80 mg total) by mouth daily.   Blood Glucose Monitoring Suppl DEVI 1 each by Does not apply route in the morning, at noon, and at bedtime. May substitute to any manufacturer covered by patient's insurance.   Continuous Blood Gluc Receiver (FREESTYLE LIBRE 2 READER) DEVI Use to test blood sugar up to 6 times daily. DX; E11.9   Continuous Blood Gluc Sensor (FREESTYLE LIBRE 2 SENSOR) MISC Use to test blood sugar up to 6 times daily. DX: E11.9   enalapril (VASOTEC) 20 MG tablet TAKE 1 TABLET BY MOUTH EVERY DAY   glucose blood (ACCU-CHEK GUIDE) test strip Check BS 3 times a day Morning, Noon and Bedtime Dx E11.9   levocetirizine (XYZAL) 5 MG tablet TAKE 1 TABLET BY MOUTH EVERY DAY IN THE EVENING   omeprazole (PRILOSEC) 20 MG capsule Take 1 capsule (20 mg total) by mouth daily.   traMADol (ULTRAM) 50 MG tablet Take 2 tablets (100 mg total) by mouth every 12 (twelve) hours as needed.   [DISCONTINUED] Insulin Aspart FlexPen (NOVOLOG) 100 UNIT/ML Inject 20-26 Units into the skin 3 (three) times daily with meals.   [DISCONTINUED] Insulin Degludec FlexTouch 200 UNIT/ML SOPN Inject 40 Units into the skin at bedtime.   [DISCONTINUED] Insulin Pen Needle (BD PEN NEEDLE MICRO U/F) 32G X 6 MM MISC Use with injection 6 times daily Dx E10.9   Insulin Aspart FlexPen (NOVOLOG) 100 UNIT/ML Inject 20-26 Units into the skin 3 (three) times daily with meals.   Insulin Degludec FlexTouch 200 UNIT/ML SOPN Inject 44 Units into the skin at bedtime.   Insulin Pen Needle (BD PEN NEEDLE MICRO U/F) 32G X 6 MM MISC Use with injection 6 times daily Dx E10.9   No facility-administered encounter medications on file as of 08/29/2023.    ALLERGIES: Allergies  Allergen Reactions    Codeine Nausea And Vomiting    VACCINATION STATUS: Immunization History  Administered Date(s) Administered   Fluad Quad(high Dose 65+) 07/27/2020, 03/22/2021   Fluad Trivalent(High Dose 65+) 02/20/2023   Influenza Split 03/03/2014   Influenza, High Dose Seasonal PF 01/21/2019   Influenza-Unspecified 01/23/2019   Moderna Sars-Covid-2 Vaccination 07/02/2019, 07/22/2019, 07/31/2019, 02/15/2020, 09/18/2020  Pneumococcal Conjugate-13 09/26/2016   Pneumococcal Polysaccharide-23 07/23/2018   Rsv, Bivalent, Protein Subunit Rsvpref,pf Verdis Frederickson) 07/29/2023   Zoster Recombinant(Shingrix) 07/29/2023    Diabetes He presents for his follow-up diabetic visit. He has type 1 diabetes mellitus. Onset time: Diagnosed at approx age of 41. His disease course has been improving. There are no hypoglycemic associated symptoms. There are no diabetic associated symptoms. There are no hypoglycemic complications. Symptoms are stable. Diabetic complications include heart disease (MI). Risk factors for coronary artery disease include diabetes mellitus, dyslipidemia, family history, male sex, sedentary lifestyle and hypertension. Current diabetic treatment includes intensive insulin program. He is compliant with treatment most of the time (currently on Tresiba 24 units in am and 20 units in pm; Novolog 24-30 units TID). His weight is fluctuating minimally. He is following a generally unhealthy diet. When asked about meal planning, he reported none. He has not had a previous visit with a dietitian. He rarely participates in exercise. His home blood glucose trend is fluctuating minimally. His overall blood glucose range is 140-180 mg/dl. (He presents today with his CGM showing mostly at goal glycemic profile.  His POCT A1c today is 7.2%, improving from last visit of 7.5%.  He is shocked by this as his glucose has been running somewhat higher lately.   Analysis of his CGM shows TIR 52%, TAR 48%, TBR 0% with a GMI of 7.7%.) An ACE  inhibitor/angiotensin II receptor blocker is being taken. He does not see a podiatrist.Eye exam is not current.     Review of systems  Constitutional: + increasing body weight, current Body mass index is 28.13 kg/m., no fatigue, no subjective hyperthermia, no subjective hypothermia Eyes: no blurry vision, no xerophthalmia ENT: no sore throat, no nodules palpated in throat, no dysphagia/odynophagia, no hoarseness Cardiovascular: no chest pain, no shortness of breath, no palpitations, no leg swelling Respiratory: no cough, no shortness of breath Gastrointestinal: no nausea/vomiting/diarrhea Musculoskeletal: no muscle/joint aches, walks with cane due to disequilibrium Skin: no rashes, no hyperemia Neurological: no tremors, no numbness, no tingling, no dizziness Psychiatric: no depression, no anxiety  Objective:     BP 132/84 (BP Location: Left Arm, Patient Position: Sitting, Cuff Size: Large)   Pulse 80   Ht 6' (1.829 m)   Wt 207 lb 6.4 oz (94.1 kg)   BMI 28.13 kg/m   Wt Readings from Last 3 Encounters:  08/29/23 207 lb 6.4 oz (94.1 kg)  06/07/23 200 lb 6.4 oz (90.9 kg)  03/14/23 200 lb 12.8 oz (91.1 kg)     BP Readings from Last 3 Encounters:  08/29/23 132/84  07/22/23 (!) 169/82  06/07/23 136/73     Physical Exam- Limited  Constitutional:  Body mass index is 28.13 kg/m. , not in acute distress, normal state of mind Eyes:  EOMI, no exophthalmos Musculoskeletal: no gross deformities, strength intact in all four extremities, no gross restriction of joint movements, walks with cane due to disequilibrium Skin:  no rashes, no hyperemia Neurological: no tremor with outstretched hands   Diabetic Foot Exam - Simple   No data filed     CMP ( most recent) CMP     Component Value Date/Time   NA 143 02/27/2023 1002   K 4.9 02/27/2023 1002   CL 103 02/27/2023 1002   CO2 24 02/27/2023 1002   GLUCOSE 130 (H) 02/27/2023 1002   GLUCOSE 180 (H) 08/16/2021 1234   BUN 5 (L)  02/27/2023 1002   CREATININE 0.77 02/27/2023 1002   CALCIUM 9.4 02/27/2023  1002   PROT 6.5 02/27/2023 1002   ALBUMIN 3.9 02/27/2023 1002   AST 32 02/27/2023 1002   ALT 31 02/27/2023 1002   ALKPHOS 78 02/27/2023 1002   BILITOT 0.8 02/27/2023 1002   GFRNONAA 102 07/22/2020 0916   GFRAA 118 07/22/2020 0916     Diabetic Labs (most recent): Lab Results  Component Value Date   HGBA1C 7.2 (A) 08/29/2023   HGBA1C 7.5 (A) 03/14/2023   HGBA1C 7.0 (A) 12/10/2022   MICROALBUR 10mg /L 03/14/2023     Lipid Panel ( most recent) Lipid Panel     Component Value Date/Time   CHOL 184 02/27/2023 1002   TRIG 90 02/27/2023 1002   HDL 81 02/27/2023 1002   CHOLHDL 2.3 02/27/2023 1002   LDLCALC 87 02/27/2023 1002   LABVLDL 16 02/27/2023 1002      Lab Results  Component Value Date   TSH 2.010 02/27/2023   TSH 1.330 09/21/2021           Assessment & Plan:   1) Type 1 diabetes mellitus without complication (HCC)  He presents today with his CGM showing mostly at goal glycemic profile.  His POCT A1c today is 7.2%, improving from last visit of 7.5%.  He is shocked by this as his glucose has been running somewhat higher lately.   Analysis of his CGM shows TIR 52%, TAR 48%, TBR 0% with a GMI of 7.7%.  - Lional Icenogle has currently uncontrolled symptomatic type 1 DM since 73 years of age.   -Recent labs reviewed.  - I had a long discussion with him about the progressive nature of diabetes and the pathology behind its complications. 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 These are all discussed in detail with him.  The following Lifestyle Medicine recommendations according to American College of Lifestyle Medicine [facility]) were discussed and offered to patient and he agrees to start the journey:  A. Whole Foods, Plant-based plate comprising of fruits and  vegetables, plant-based proteins, whole-grain carbohydrates was discussed in detail with the patient.   A list for source of those nutrients were also provided to the patient.  Patient will use only water or unsweetened tea for hydration. B.  The need to stay away from risky substances including alcohol, smoking; obtaining 7 to 9 hours of restorative sleep, at least 150 minutes of moderate intensity exercise weekly, the importance of healthy social connections,  and stress reduction techniques were discussed. C.  A full color page of  Calorie density of various food groups per pound showing examples of each food groups was provided to the patient.  - Nutritional counseling repeated at each appointment due to patients tendency to fall back in to old habits.  - The patient admits there is a room for improvement in their diet and drink choices. -  Suggestion is made for the patient to avoid simple carbohydrates from their diet including Cakes, Sweet Desserts / Pastries, Ice Cream, Soda (diet and regular), Sweet Tea, Candies, Chips, Cookies, Sweet Pastries, Store Bought Juices, Alcohol in Excess of 1-2 drinks a day, Artificial Sweeteners, Coffee Creamer, and "Sugar-free" Products. This will help patient to have stable blood glucose profile and potentially avoid unintended weight gain.   - I encouraged the patient to switch to unprocessed or minimally processed complex starch and increased protein intake (animal or plant source), fruits, and vegetables.   -  Patient is advised to stick to a routine mealtimes to eat 3 meals a day and avoid unnecessary snacks (to snack only to correct hypoglycemia).  - I have approached him with the following individualized plan to manage his diabetes and patient agrees:   He is advised to continue his Tresiba 44 units SQ nightly, and Novolog 20-26 units TID with meals (usually only eating 2 meals per day) if glucose is above 90 and he is eating (Specific instructions on how  to titrate insulin dosage based on glucose readings given to patient in writing).  I did encourage him to reach out if he has low readings 3 days in a week as we may need to lower his insulin dose given the warmer season and increased activity that typically comes with it.  -he is encouraged to continue monitoring glucose 4 times daily (using his CGM), before meals and before bed, and to call the clinic if he has readings less than 70 or above 300 for 3 tests in a row.  - he is warned not to take insulin without proper monitoring per orders. - Adjustment parameters are given to him for hypo and hyperglycemia in writing.  - he is not an ideal candidate of non-insulin therapies given his type 1 diagnosis.  - Specific targets for  A1c; LDL, HDL, and Triglycerides were discussed with the patient.  2) Blood Pressure /Hypertension:  his blood pressure is controlled to target.   he is advised to continue his current medications including Enalapril 20 mg p.o. daily with breakfast.  Will recheck CMP prior to next visit.  3) Lipids/Hyperlipidemia:    Review of his recent lipid panel from 02/27/23 showed controlled LDL at 87 .  he is advised to continue Lipitor 40 mg daily at bedtime.  Side effects and precautions discussed with him.  4)  Weight/Diet:  his Body mass index is 28.13 kg/m.  -  clearly complicating his diabetes care.   he is a candidate for weight loss. I discussed with him the fact that loss of 5 - 10% of his  current body weight will have the most impact on his diabetes management.  Exercise, and detailed carbohydrates information provided  -  detailed on discharge instructions.  5) Chronic Care/Health Maintenance: -he is on ACEI/ARB and Statin medications and is encouraged to initiate and continue to follow up with Ophthalmology, Dentist, Podiatrist at least yearly or according to recommendations, and advised to stay away from smoking. I have recommended yearly flu vaccine and pneumonia  vaccine at least every 5 years; moderate intensity exercise for up to 150 minutes weekly; and sleep for at least 7 hours a day.  - he is advised to maintain close follow up with Gabriel Earing, FNP for primary care needs, as well as his other providers for optimal and coordinated care.     I spent  42  minutes in the care of the patient today including review of labs from CMP, Lipids, Thyroid Function, Hematology (current and previous including abstractions from other facilities); face-to-face time discussing  his blood glucose readings/logs, discussing hypoglycemia and hyperglycemia episodes and symptoms, medications doses, his options of short and long term treatment based on the latest standards of care / guidelines;  discussion about incorporating lifestyle medicine;  and documenting the encounter. Risk reduction counseling performed per USPSTF guidelines to reduce obesity and cardiovascular risk factors.     Please refer to Patient Instructions for Blood Glucose Monitoring and Insulin/Medications Dosing Guide"  in  media tab for additional information. Please  also refer to " Patient Self Inventory" in the Media  tab for reviewed elements of pertinent patient history.  Robet Leu participated in the discussions, expressed understanding, and voiced agreement with the above plans.  All questions were answered to his satisfaction. he is encouraged to contact clinic should he have any questions or concerns prior to his return visit.     Follow up plan: - Return in about 4 months (around 12/29/2023) for Diabetes F/U with A1c in office, Previsit labs, Bring meter and logs.   Ronny Bacon, [facility] [address] [place], [place] [address] Phone: [phone] Fax: [phone]  08/29/2023, 1:51 PM

## 2023-08-30 ENCOUNTER — Encounter: Payer: Self-pay | Admitting: Nurse Practitioner

## 2023-08-30 ENCOUNTER — Other Ambulatory Visit: Payer: Self-pay | Admitting: Nurse Practitioner

## 2023-08-30 DIAGNOSIS — E1065 Type 1 diabetes mellitus with hyperglycemia: Secondary | ICD-10-CM

## 2023-09-02 MED ORDER — INSULIN ASPART FLEXPEN 100 UNIT/ML ~~LOC~~ SOPN: 26.0000 [IU] | PEN_INJECTOR | Freq: Three times a day (TID) | SUBCUTANEOUS | 3 refills | Status: DC

## 2023-09-05 ENCOUNTER — Ambulatory Visit: Payer: Medicare Other | Admitting: Family Medicine

## 2023-09-10 ENCOUNTER — Ambulatory Visit: Payer: Medicare Other

## 2023-09-10 VITALS — BP 132/84 | Ht 72.0 in | Wt 209.0 lb

## 2023-09-10 DIAGNOSIS — Z0000 Encounter for general adult medical examination without abnormal findings: Secondary | ICD-10-CM | POA: Diagnosis not present

## 2023-09-10 DIAGNOSIS — Z5320 Procedure and treatment not carried out because of patient's decision for unspecified reasons: Secondary | ICD-10-CM

## 2023-09-10 NOTE — Progress Notes (Signed)
 Because this visit was a virtual/telehealth visit,  certain criteria was not obtained, such a blood pressure, CBG if applicable, and timed get up and go. Any medications not marked as "taking" were not mentioned during the medication reconciliation part of the visit. Any vitals not documented were not able to be obtained due to this being a telehealth visit or patient was unable to self-report a recent blood pressure reading due to a lack of equipment at home via telehealth. Vitals that have been documented are verbally provided by the patient.  Subjective:   Dustin Graham is a 73 y.o. who presents for a Medicare Wellness preventive visit.  Visit Complete: Virtual I connected with  Dustin Graham on 09/10/23 by a audio enabled telemedicine application and verified that I am speaking with the correct person using two identifiers.  Patient Location: Home  Provider Location: Home Office  I discussed the limitations of evaluation and management by telemedicine. The patient expressed understanding and agreed to proceed.  Vital Signs: Because this visit was a virtual/telehealth visit, some criteria may be missing or patient reported. Any vitals not documented were not able to be obtained and vitals that have been documented are patient reported.  VideoDeclined- This patient declined Librarian, academic. Therefore the visit was completed with audio only.  Persons Participating in Visit: Patient.  AWV Questionnaire: No: Patient Medicare AWV questionnaire was not completed prior to this visit.  Cardiac Risk Factors include: advanced age (>17men, >78 women);male gender;hypertension;diabetes mellitus     Objective:    Today's Vitals   09/10/23 1343  BP: 132/84  Weight: 209 lb (94.8 kg)  Height: 6' (1.829 m)   Body mass index is 28.35 kg/m.     09/10/2023    1:48 PM 09/07/2022    2:00 PM 03/22/2022    6:52 AM 03/19/2022    1:51 PM 02/15/2022    8:39 AM 02/12/2022     1:04 PM 09/05/2021    2:57 PM  Advanced Directives  Does Patient Have a Medical Advance Directive? No No No No No No No  Would patient like information on creating a medical advance directive? No - Patient declined No - Patient declined No - Patient declined No - Patient declined No - Patient declined No - Patient declined No - Patient declined    Current Medications (verified) Outpatient Encounter Medications as of 09/10/2023  Medication Sig   Accu-Chek Softclix Lancets lancets Check BS 3 times a day Morning, Noon and Bedtime Dx E11.9   Aspirin-Salicylamide-Caffeine (BC HEADACHE POWDER PO) Take 1 packet by mouth daily as needed (Back pain).   atorvastatin (LIPITOR) 80 MG tablet Take 1 tablet (80 mg total) by mouth daily.   Blood Glucose Monitoring Suppl DEVI 1 each by Does not apply route in the morning, at noon, and at bedtime. May substitute to any manufacturer covered by patient's insurance.   Continuous Blood Gluc Receiver (FREESTYLE LIBRE 2 READER) DEVI Use to test blood sugar up to 6 times daily. DX; E11.9   Continuous Blood Gluc Sensor (FREESTYLE LIBRE 2 SENSOR) MISC Use to test blood sugar up to 6 times daily. DX: E11.9   enalapril (VASOTEC) 20 MG tablet TAKE 1 TABLET BY MOUTH EVERY DAY   glucose blood (ACCU-CHEK GUIDE) test strip Check BS 3 times a day Morning, Noon and Bedtime Dx E11.9   Insulin Aspart FlexPen (NOVOLOG) 100 UNIT/ML Inject 26-32 Units into the skin 3 (three) times daily with meals.   Insulin Degludec FlexTouch 200  UNIT/ML SOPN INJECT 44 UNITS INTO THE SKIN AT BEDTIME.   Insulin Pen Needle (BD PEN NEEDLE MICRO U/F) 32G X 6 MM MISC Use with injection 6 times daily Dx E10.9   levocetirizine (XYZAL) 5 MG tablet TAKE 1 TABLET BY MOUTH EVERY DAY IN THE EVENING   omeprazole (PRILOSEC) 20 MG capsule Take 1 capsule (20 mg total) by mouth daily.   traMADol (ULTRAM) 50 MG tablet Take 2 tablets (100 mg total) by mouth every 12 (twelve) hours as needed.   No  facility-administered encounter medications on file as of 09/10/2023.    Allergies (verified) Codeine   History: Past Medical History:  Diagnosis Date   Arthritis    Bladder cancer (HCC) 02/16/2022   GERD (gastroesophageal reflux disease)    HLD (hyperlipidemia)    Hypertension associated with diabetes (HCC) 02/29/2016   Rosacea 01/28/2019   Type 1 diabetes (HCC)    Past Surgical History:  Procedure Laterality Date   broken nose     CHOLECYSTECTOMY     CYSTOSCOPY W/ RETROGRADES Bilateral 02/15/2022   Procedure: CYSTOSCOPY WITH RETROGRADE PYELOGRAM;  Surgeon: Marco Severs, MD;  Location: [facility];  Service: Urology;  Laterality: Bilateral;  pt knows to arrive at 8:00   GALLBLADDER SURGERY     LUMBAR DISC SURGERY     fracture   TRANSURETHRAL RESECTION OF BLADDER TUMOR N/A 02/15/2022   Procedure: TRANSURETHRAL RESECTION OF BLADDER TUMOR (TURBT);  Surgeon: Marco Severs, MD;  Location: [facility];  Service: Urology;  Laterality: N/A;   TRANSURETHRAL RESECTION OF BLADDER TUMOR N/A 03/22/2022   Procedure: TRANSURETHRAL RESECTION OF BLADDER TUMOR (TURBT);  Surgeon: Marco Severs, MD;  Location: [facility];  Service: Urology;  Laterality: N/A;   Family History  Problem Relation Age of Onset   Peptic Ulcer Father    Diabetes Paternal Aunt    Colon cancer Neg Hx    Esophageal cancer Neg Hx    Stomach cancer Neg Hx    Colon polyps Neg Hx    Social History   Socioeconomic History   Marital status: Divorced    Spouse name: Not on file   Number of children: 1   Years of education: 12   Highest education level: Some college, no degree  Occupational History    Employer: [employer]   Occupation: Estate manager/land agent    Comment: [employer]  Tobacco Use   Smoking status: Never   Smokeless tobacco: Never  Vaping Use   Vaping status: Never Used  Substance and Sexual Activity   Alcohol use: Yes    Alcohol/week: 3.0 - 4.0 standard drinks of alcohol    Types:  3 - 4 Standard drinks or equivalent per week    Comment: several beers and a couple of shots per day   Drug use: Not Currently    Types: Marijuana   Sexual activity: Not on file  Other Topics Concern   Not on file  Social History Narrative   Lives alone   Still working full time at [employer]   Daughter lives 20 miles away   Social Drivers of Health   Financial Resource Strain: Low Risk  (06/07/2023)   Overall Financial Resource Strain (CARDIA)    Difficulty of Paying Living Expenses: Not very hard  Food Insecurity: No Food Insecurity (09/10/2023)   Hunger Vital Sign    Worried About Running Out of Food in the Last Year: Never true    Ran Out of Food in the Last  Year: Never true  Transportation Needs: No Transportation Needs (09/10/2023)   PRAPARE - Administrator, Civil Service (Medical): No    Lack of Transportation (Non-Medical): No  Physical Activity: Insufficiently Active (09/10/2023)   Exercise Vital Sign    Days of Exercise per Week: 3 days    Minutes of Exercise per Session: 30 min  Stress: No Stress Concern Present (09/10/2023)   [employer] of Occupational Health - Occupational Stress Questionnaire    Feeling of Stress : Only a little  Social Connections: Socially Isolated (09/10/2023)   Social Connection and Isolation Panel [NHANES]    Frequency of Communication with Friends and Family: Once a week    Frequency of Social Gatherings with Friends and Family: Once a week    Attends Religious Services: Never    Database administrator or Organizations: No    Attends Engineer, structural: Never    Marital Status: Divorced    Tobacco Counseling Counseling given: Not Answered    Clinical Intake:  Pre-visit preparation completed: Yes  Pain : No/denies pain     BMI - recorded: 28.35 Nutritional Status: BMI 25 -29 Overweight Nutritional Risks: None Diabetes: No  Lab Results  Component Value Date   HGBA1C 7.2 (A) 08/29/2023    HGBA1C 7.5 (A) 03/14/2023   HGBA1C 7.0 (A) 12/10/2022     How often do you need to have someone help you when you read instructions, pamphlets, or other written materials from your doctor or pharmacy?: 1 - Never What is the last grade level you completed in school?: HS and some college     Information entered by :: [employer]   Activities of Daily Living     09/10/2023    1:47 PM  In your present state of health, do you have any difficulty performing the following activities:  Hearing? 0  Vision? 0  Difficulty concentrating or making decisions? 0  Walking or climbing stairs? 0  Dressing or bathing? 0  Doing errands, shopping? 0  Preparing Food and eating ? N  Using the Toilet? N  In the past six months, have you accidently leaked urine? N  Do you have problems with loss of bowel control? N  Managing your Medications? N  Managing your Finances? N  Housekeeping or managing your Housekeeping? N    Patient Care Team: Albertha Huger, FNP as PCP - General (Family Medicine) Delilah Fend, [facility] (Pharmacist) Augustin Bloch, OD (Optometry) Elois Hair, MD as Consulting Physician (Gastroenterology)  Indicate any recent Medical Services you may have received from other than [facility] providers in the past year (date may be approximate).     Assessment:   This is a routine wellness examination for Dustin Graham.  Hearing/Vision screen Hearing Screening - Comments:: No hearing difficulties  Vision Screening - Comments:: Patient wears glasses   Goals Addressed             This Visit's Progress    DIET - EAT MORE FRUITS AND VEGETABLES   On track      Depression Screen     09/10/2023    1:49 PM 06/07/2023   12:55 PM 02/20/2023    2:33 PM 11/19/2022    3:06 PM 09/07/2022    1:59 PM 08/16/2022    9:28 AM 05/02/2022    2:08 PM  PHQ 2/9 Scores  PHQ - 2 Score 0 0 0 2 0 1 1  PHQ- 9 Score 0 0 0 3 0 2  5    Fall Risk     09/10/2023    1:48 PM 06/07/2023   12:55 PM  02/20/2023    2:32 PM 11/19/2022    3:08 PM 09/07/2022    1:58 PM  Fall Risk   Falls in the past year? 0 0 0 0 0  Number falls in past yr: 0   0 0  Injury with Fall? 0   0 0  Risk for fall due to : No Fall Risks   No Fall Risks No Fall Risks  Follow up Falls prevention discussed;Falls evaluation completed   Falls evaluation completed Falls prevention discussed    MEDICARE RISK AT HOME:  Medicare Risk at Home Any stairs in or around the home?: Yes If so, are there any without handrails?: No Home free of loose throw rugs in walkways, pet beds, electrical cords, etc?: Yes Adequate lighting in your home to reduce risk of falls?: Yes Life alert?: No Use of a cane, walker or w/c?: No Grab bars in the bathroom?: Yes Shower chair or bench in shower?: No Elevated toilet seat or a handicapped toilet?: No  TIMED UP AND GO:  Was the test performed?  no  Cognitive Function: 6CIT completed        09/10/2023    1:45 PM 09/07/2022    2:01 PM 09/05/2021    2:58 PM 09/02/2019    2:37 PM  6CIT Screen  What Year? 0 points 0 points 0 points 0 points  What month? 0 points 0 points 0 points 0 points  What time? 0 points 0 points 0 points 0 points  Count back from 20 0 points 0 points 0 points 0 points  Months in reverse 0 points 0 points 0 points 0 points  Repeat phrase 0 points 0 points 2 points 0 points  Total Score 0 points 0 points 2 points 0 points    Immunizations Immunization History  Administered Date(s) Administered   Fluad Quad(high Dose 65+) 07/27/2020, 03/22/2021   Fluad Trivalent(High Dose 65+) 02/20/2023   Influenza Split 03/03/2014   Influenza, High Dose Seasonal PF 01/21/2019   Influenza-Unspecified 01/23/2019   Moderna Sars-Covid-2 Vaccination 07/02/2019, 07/22/2019, 07/31/2019, 02/15/2020, 09/18/2020   Pneumococcal Conjugate-13 09/26/2016   Pneumococcal Polysaccharide-23 07/23/2018   Rsv, Bivalent, Protein Subunit Rsvpref,pf Pattricia Bores) 07/29/2023   Zoster  Recombinant(Shingrix) 07/29/2023    Screening Tests Health Maintenance  Topic Date Due   Fecal DNA (Cologuard)  Never done   OPHTHALMOLOGY EXAM  06/09/2020   COVID-19 Vaccine (6 - 2024-25 season) 03/07/2024 (Originally 01/27/2023)   DTaP/Tdap/Td (1 - Tdap) 06/06/2024 (Originally 05/20/1970)   Zoster Vaccines- Shingrix (2 of 2) 09/23/2023   FOOT EXAM  12/10/2023   INFLUENZA VACCINE  12/27/2023   Diabetic kidney evaluation - eGFR measurement  02/27/2024   HEMOGLOBIN A1C  02/28/2024   Diabetic kidney evaluation - Urine ACR  03/13/2024   Medicare Annual Wellness (AWV)  09/09/2024   Pneumonia Vaccine 52+ Years old  Completed   Hepatitis C Screening  Completed   HPV VACCINES  Aged Out   Meningococcal B Vaccine  Aged Out    Health Maintenance  Health Maintenance Due  Topic Date Due   Fecal DNA (Cologuard)  Never done   OPHTHALMOLOGY EXAM  06/09/2020   Health Maintenance Items Addressed:patient states he already has the cologuard and waiting on a new eye doctor  Additional Screening:  Vision Screening: Recommended annual ophthalmology exams for early detection of glaucoma and other disorders of the eye.  Dental Screening: Recommended annual dental exams for proper oral hygiene  Community Resource Referral / Chronic Care Management: CRR required this visit?  No   CCM required this visit?  No     Plan:     I have personally reviewed and noted the following in the patient's chart:   Medical and social history Use of alcohol, tobacco or illicit drugs  Current medications and supplements including opioid prescriptions. Patient is not currently taking opioid prescriptions. Functional ability and status Nutritional status Physical activity Advanced directives List of other physicians Hospitalizations, surgeries, and ER visits in previous 12 months Vitals Screenings to include cognitive, depression, and falls Referrals and appointments  In addition, I have reviewed and  discussed with patient certain preventive protocols, quality metrics, and best practice recommendations. A written personalized care plan for preventive services as well as general preventive health recommendations were provided to patient.     Freeda Jerry, [place]   09/10/2023   After Visit Summary: (MyChart) Due to this being a telephonic visit, the after visit summary with patients personalized plan was offered to patient via MyChart   Notes: Nothing significant to report at this time.

## 2023-09-10 NOTE — Patient Instructions (Signed)
 Mr. Dustin Graham , Thank you for taking time to come for your Medicare Wellness Visit. I appreciate your ongoing commitment to your health goals. Please review the following plan we discussed and let me know if I can assist you in the future.   Referrals/Orders/Follow-Ups/Clinician Recommendations: follow up in 1 year for next medicare annual wellness.  This is a list of the screening recommended for you and due dates:  Health Maintenance  Topic Date Due   Cologuard (Stool DNA test)  Never done   Eye exam for diabetics  06/09/2020   COVID-19 Vaccine (6 - 2024-25 season) 03/07/2024*   DTaP/Tdap/Td vaccine (1 - Tdap) 06/06/2024*   Zoster (Shingles) Vaccine (2 of 2) 09/23/2023   Complete foot exam   12/10/2023   Flu Shot  12/27/2023   Yearly kidney function blood test for diabetes  02/27/2024   Hemoglobin A1C  02/28/2024   Yearly kidney health urinalysis for diabetes  03/13/2024   Medicare Annual Wellness Visit  09/09/2024   Pneumonia Vaccine  Completed   Hepatitis C Screening  Completed   HPV Vaccine  Aged Out   Meningitis B Vaccine  Aged Out  *Topic was postponed. The date shown is not the original due date.    Advanced directives: (Declined) Advance directive discussed with you today. Even though you declined this today, please call our office should you change your mind, and we can give you the proper paperwork for you to fill out.  Next Medicare Annual Wellness Visit scheduled for next year: Yes

## 2023-09-20 ENCOUNTER — Other Ambulatory Visit: Payer: Self-pay | Admitting: Family Medicine

## 2023-09-20 DIAGNOSIS — E1159 Type 2 diabetes mellitus with other circulatory complications: Secondary | ICD-10-CM

## 2023-09-20 DIAGNOSIS — I152 Hypertension secondary to endocrine disorders: Secondary | ICD-10-CM

## 2023-09-26 ENCOUNTER — Encounter: Payer: Self-pay | Admitting: Family Medicine

## 2023-09-26 ENCOUNTER — Ambulatory Visit (INDEPENDENT_AMBULATORY_CARE_PROVIDER_SITE_OTHER): Admitting: Family Medicine

## 2023-09-26 VITALS — BP 150/69 | HR 84 | Temp 98.2°F | Ht 72.0 in | Wt 205.2 lb

## 2023-09-26 DIAGNOSIS — M47816 Spondylosis without myelopathy or radiculopathy, lumbar region: Secondary | ICD-10-CM

## 2023-09-26 DIAGNOSIS — E1065 Type 1 diabetes mellitus with hyperglycemia: Secondary | ICD-10-CM

## 2023-09-26 DIAGNOSIS — Z79899 Other long term (current) drug therapy: Secondary | ICD-10-CM

## 2023-09-26 DIAGNOSIS — E1069 Type 1 diabetes mellitus with other specified complication: Secondary | ICD-10-CM | POA: Diagnosis not present

## 2023-09-26 DIAGNOSIS — M47812 Spondylosis without myelopathy or radiculopathy, cervical region: Secondary | ICD-10-CM | POA: Diagnosis not present

## 2023-09-26 DIAGNOSIS — K219 Gastro-esophageal reflux disease without esophagitis: Secondary | ICD-10-CM | POA: Diagnosis not present

## 2023-09-26 DIAGNOSIS — E782 Mixed hyperlipidemia: Secondary | ICD-10-CM

## 2023-09-26 DIAGNOSIS — I152 Hypertension secondary to endocrine disorders: Secondary | ICD-10-CM

## 2023-09-26 DIAGNOSIS — E1159 Type 2 diabetes mellitus with other circulatory complications: Secondary | ICD-10-CM

## 2023-09-26 MED ORDER — TRAMADOL HCL 50 MG PO TABS
100.0000 mg | ORAL_TABLET | Freq: Two times a day (BID) | ORAL | 2 refills | Status: DC | PRN
Start: 2023-09-26 — End: 2023-12-30

## 2023-09-26 NOTE — Progress Notes (Signed)
 Established Patient Office Visit  Subjective   Patient ID: Dustin Graham, male    DOB: July 19, 1950  Age: 73 y.o. MRN: [id number]  Chief Complaint  Patient presents with   Medical Management of Chronic Issues    HPI DM Managed by endo. Last A1c 7.2.   2. HTN Complaint with meds - Yes Current Medications - enalapril  20 mg daily Pertinent ROS:  Visual Disturbances - No Chest pain - No Dyspnea - No Palpitations - No LE edema - No   3. GERD Compliant with medications - Yes Current medications - prilosec 20 mg  Cough - No Sore throat - No Voice change - No Hemoptysis - No Dysphagia or dyspepsia - No Water  brash - No Red Flags (weight loss, hematochezia, melena, weight loss, early satiety, fevers, odynophagia, or persistent vomiting) - No  4. Pain Cause of pain- history of a kyphoplasty at T12 due to a compression fracture.    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 Mild bilateral bony foraminal narrowing at those levels.     Pain location- back, neck pain Pain on scale of 1-10- 7-8/10 without medication and 4-5/10 with medication Frequency- daily. He does not take tramadol  daily however.  What increases pain- pain is constant What makes pain better- medication Effects on ADL- makes it slower Any change in general medical condition- no Current opioids rx- Tramadol  # meds rx- 60 Effectiveness of current meds- very good Adverse reactions from pain meds- none Morphine equivalent- 20 MME/day   Pill count performed-No Last drug screen - 11/19/22 ( high risk q69m, moderate risk q35m, low risk yearly ) Urine drug screen today- Yes Was the NCCSR reviewed- Yes             If yes were their any concerning findings? - No   Overdose risk: 190       10/19/2021     3:49 PM  Opioid Risk   Alcohol 3  Illegal Drugs 0  Rx Drugs 0  Alcohol 0  Illegal Drugs 0  Rx Drugs 0  Age between 16-45 years  0  History of Preadolescent Sexual Abuse 0  Psychological Disease 0  Depression 0  Opioid Risk Tool Scoring 3  Opioid Risk Interpretation Low Risk     Past Medical History:  Diagnosis Date   Arthritis    Bladder cancer (HCC) 02/16/2022   GERD (gastroesophageal reflux disease)    HLD (hyperlipidemia)    Hypertension associated with diabetes (HCC) 02/29/2016   Rosacea 01/28/2019   Type 1 diabetes (HCC)       ROS As per HPI.   Objective:     BP (!) 150/69   Pulse 84   Temp 98.2 F (36.8 C) (Temporal)   Ht 6' (1.829 m)   Wt 205 lb 3.2 oz (93.1 kg)   SpO2 98%   BMI 27.83 kg/m  Wt Readings from Last 3 Encounters:  09/26/23 205 lb 3.2 oz (93.1 kg)  09/10/23 209 lb (94.8 kg)  08/29/23 207 lb 6.4 oz (94.1 kg)   BP Readings from Last 3 Encounters:  09/26/23 (!) 150/69  09/10/23 132/84  08/29/23 132/84    Physical Exam Vitals and nursing note reviewed.  Constitutional:      General: He is not in acute distress.    Appearance: Normal  appearance. He is not ill-appearing, toxic-appearing or diaphoretic.  Cardiovascular:     Rate and Rhythm: Normal rate and regular rhythm.     Pulses: Normal pulses.     Heart sounds: Normal heart sounds. No murmur heard. Pulmonary:     Effort: Pulmonary effort is normal. No respiratory distress.     Breath sounds: Normal breath sounds.  Musculoskeletal:     Cervical back: Neck supple. No tenderness.     Right lower leg: No edema.     Left lower leg: No edema.  Lymphadenopathy:     Cervical: No cervical adenopathy.  Skin:    General: Skin is warm and dry.  Neurological:     General: No focal deficit present.     Mental Status: He is alert and oriented to person, place, and time.  Psychiatric:        Mood and Affect: Mood normal.        Behavior: Behavior normal.      No results found for  any visits on 09/26/23.    The 10-year ASCVD risk score (Arnett DK, et al., 2019) is: 42%    Assessment & Plan:   Dustin Graham" was seen today for medical management of chronic issues.  Diagnoses and all orders for this visit:  Type 1 diabetes mellitus with hyperglycemia (HCC) Managed by endo. Improving. A1c 7.2  Hypertension associated with diabetes (HCC) BP not at goal today. Recent readings were in range. Will have him return in 2 weeks for recheck with nurse and will adjust medication if needed.   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 On statin.   Gastroesophageal reflux disease without esophagitis Well controlled on current regimen.   Cervical spondylosis Spondylosis of lumbar spine Controlled substance agreement signed Well controlled on current regimen. Refills provided. PDMP reviewed, no red flags. CSA and UDS updated today.  -     traMADol  (ULTRAM ) 50 MG tablet; Take 2 tablets (100 mg total) by mouth every 12 (twelve) hours as needed. -     ToxASSURE Select 13 (MW), Urine    Return in about 3 months (around 12/27/2023) for chronic follow up. 2 weeks for nurse visit for BP check.   The patient indicates understanding of these issues and agrees with the plan.  Albertha Huger, FNP

## 2023-09-30 LAB — TOXASSURE SELECT 13 (MW), URINE

## 2023-10-06 ENCOUNTER — Other Ambulatory Visit: Payer: Self-pay | Admitting: Family Medicine

## 2023-10-06 DIAGNOSIS — K219 Gastro-esophageal reflux disease without esophagitis: Secondary | ICD-10-CM

## 2023-10-10 ENCOUNTER — Ambulatory Visit

## 2023-10-10 DIAGNOSIS — Z0130 Encounter for examination of blood pressure without abnormal findings: Secondary | ICD-10-CM

## 2023-10-10 NOTE — Progress Notes (Signed)
 Patient came in for a BP check and it was 122/72 HR- 86  Will send to PCP and covering provider

## 2023-10-23 ENCOUNTER — Encounter: Payer: Self-pay | Admitting: Urology

## 2023-10-23 ENCOUNTER — Ambulatory Visit (INDEPENDENT_AMBULATORY_CARE_PROVIDER_SITE_OTHER): Payer: Medicare Other | Admitting: Urology

## 2023-10-23 VITALS — BP 149/81 | HR 111

## 2023-10-23 DIAGNOSIS — C671 Malignant neoplasm of dome of bladder: Secondary | ICD-10-CM

## 2023-10-23 DIAGNOSIS — Z8551 Personal history of malignant neoplasm of bladder: Secondary | ICD-10-CM | POA: Diagnosis not present

## 2023-10-23 LAB — URINALYSIS, ROUTINE W REFLEX MICROSCOPIC
Bilirubin, UA: NEGATIVE
Glucose, UA: NEGATIVE
Ketones, UA: NEGATIVE
Leukocytes,UA: NEGATIVE
Nitrite, UA: NEGATIVE
Protein,UA: NEGATIVE
RBC, UA: NEGATIVE
Specific Gravity, UA: 1.02 (ref 1.005–1.030)
Urobilinogen, Ur: 2 mg/dL — ABNORMAL HIGH (ref 0.2–1.0)
pH, UA: 6.5 (ref 5.0–7.5)

## 2023-10-23 MED ORDER — CIPROFLOXACIN HCL 500 MG PO TABS
500.0000 mg | ORAL_TABLET | Freq: Once | ORAL | Status: AC
  Administered 2023-10-23: 500 mg via ORAL

## 2023-10-23 NOTE — Patient Instructions (Signed)

## 2023-10-23 NOTE — Progress Notes (Signed)
   10/23/23  CC: followup bladder cancer   HPI: Mr Waddington is a 72yo here for followup for bladder cancer Blood pressure (!) 149/81, pulse (!) 111. NED. A&Ox3.   No respiratory distress   Abd soft, NT, ND Normal phallus with bilateral descended testicles  Cystoscopy Procedure Note  Patient identification was confirmed, informed consent was obtained, and patient was prepped using Betadine solution.  Lidocaine  jelly was administered per urethral meatus.     Pre-Procedure: - Inspection reveals a normal caliber ureteral meatus.  Procedure: The flexible cystoscope was introduced without difficulty - No urethral strictures/lesions are present. - Enlarged prostate  - Normal bladder neck - Bilateral ureteral orifices identified - Bladder mucosa  reveals no ulcers, tumors, or lesions - No bladder stones - No trabeculation     Post-Procedure: - Patient tolerated the procedure well  Assessment/ Plan: Followup 3 months for cystoscopy  No follow-ups on file.  Johnie Nailer, MD

## 2023-11-11 LAB — HM DIABETES EYE EXAM

## 2023-11-21 ENCOUNTER — Encounter: Payer: Self-pay | Admitting: Nurse Practitioner

## 2023-11-21 MED ORDER — FREESTYLE LIBRE 3 READER DEVI: 1.0000 | Freq: Once | 0 refills | Status: AC

## 2023-12-26 ENCOUNTER — Other Ambulatory Visit: Payer: Self-pay | Admitting: Family Medicine

## 2023-12-26 ENCOUNTER — Encounter: Payer: Self-pay | Admitting: Nurse Practitioner

## 2023-12-26 ENCOUNTER — Other Ambulatory Visit

## 2023-12-26 DIAGNOSIS — E1159 Type 2 diabetes mellitus with other circulatory complications: Secondary | ICD-10-CM

## 2023-12-27 LAB — COMPREHENSIVE METABOLIC PANEL WITH GFR
ALT: 33 IU/L (ref 0–44)
AST: 37 IU/L (ref 0–40)
Albumin: 4.2 g/dL (ref 3.8–4.8)
Alkaline Phosphatase: 82 IU/L (ref 44–121)
BUN/Creatinine Ratio: 11 (ref 10–24)
BUN: 9 mg/dL (ref 8–27)
Bilirubin Total: 1.3 mg/dL — ABNORMAL HIGH (ref 0.0–1.2)
CO2: 21 mmol/L (ref 20–29)
Calcium: 9.5 mg/dL (ref 8.6–10.2)
Chloride: 99 mmol/L (ref 96–106)
Creatinine, Ser: 0.85 mg/dL (ref 0.76–1.27)
Globulin, Total: 2.8 g/dL (ref 1.5–4.5)
Glucose: 144 mg/dL — ABNORMAL HIGH (ref 70–99)
Potassium: 4.6 mmol/L (ref 3.5–5.2)
Sodium: 136 mmol/L (ref 134–144)
Total Protein: 7 g/dL (ref 6.0–8.5)
eGFR: 92 mL/min/1.73 (ref >59)

## 2023-12-27 LAB — VITAMIN D 25 HYDROXY (VIT D DEFICIENCY, FRACTURES): Vit D, 25-Hydroxy: 11.5 ng/mL — ABNORMAL LOW (ref 30.0–100.0)

## 2023-12-27 LAB — TSH: TSH: 1.67 u[IU]/mL (ref 0.450–4.500)

## 2023-12-27 LAB — T4, FREE: Free T4: 1.19 ng/dL (ref 0.82–1.77)

## 2023-12-30 ENCOUNTER — Encounter: Payer: Self-pay | Admitting: Family Medicine

## 2023-12-30 ENCOUNTER — Ambulatory Visit (INDEPENDENT_AMBULATORY_CARE_PROVIDER_SITE_OTHER): Admitting: Family Medicine

## 2023-12-30 VITALS — BP 133/74 | HR 80 | Temp 98.0°F | Ht 72.0 in | Wt 207.4 lb

## 2023-12-30 DIAGNOSIS — E1065 Type 1 diabetes mellitus with hyperglycemia: Secondary | ICD-10-CM

## 2023-12-30 DIAGNOSIS — E1069 Type 1 diabetes mellitus with other specified complication: Secondary | ICD-10-CM | POA: Diagnosis not present

## 2023-12-30 DIAGNOSIS — I152 Hypertension secondary to endocrine disorders: Secondary | ICD-10-CM

## 2023-12-30 DIAGNOSIS — M47816 Spondylosis without myelopathy or radiculopathy, lumbar region: Secondary | ICD-10-CM

## 2023-12-30 DIAGNOSIS — M47812 Spondylosis without myelopathy or radiculopathy, cervical region: Secondary | ICD-10-CM

## 2023-12-30 DIAGNOSIS — K219 Gastro-esophageal reflux disease without esophagitis: Secondary | ICD-10-CM

## 2023-12-30 DIAGNOSIS — E1059 Type 1 diabetes mellitus with other circulatory complications: Secondary | ICD-10-CM | POA: Diagnosis not present

## 2023-12-30 DIAGNOSIS — E782 Mixed hyperlipidemia: Secondary | ICD-10-CM

## 2023-12-30 DIAGNOSIS — Z79899 Other long term (current) drug therapy: Secondary | ICD-10-CM

## 2023-12-30 MED ORDER — TRAMADOL HCL 50 MG PO TABS: 100.0000 mg | ORAL_TABLET | Freq: Two times a day (BID) | ORAL | 2 refills | Status: AC | PRN

## 2023-12-30 NOTE — Progress Notes (Signed)
 Established Patient Office Visit  Subjective   Patient ID: Dustin Graham, male    DOB: 1950/07/03  Age: 73 y.o. MRN: [id number]  Chief Complaint  Patient presents with   Medical Management of Chronic Issues    HPI DM Managed by endo. Last A1c 7.2. Has appt tomorrow with endo. Endo will do foot exam tomorrow per patient.   2. HTN Complaint with meds - Yes Current Medications - enalapril  20 mg daily Pertinent ROS:  Visual Disturbances - No Chest pain - No Dyspnea - No Palpitations - No LE edema - No   3. GERD Compliant with medications - Yes Current medications - prilosec 20 mg  Cough - No Sore throat - No Voice change - No Hemoptysis - No Dysphagia or dyspepsia - No Water  brash - No Red Flags (weight loss, hematochezia, melena, weight loss, early satiety, fevers, odynophagia, or persistent vomiting) - No  4. Pain Cause of pain- history of a kyphoplasty at T12 due to a compression fracture.    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 Mild bilateral bony foraminal narrowing at those levels.     Pain location- back, neck pain Pain on scale of 1-10- 7-8/10 without medication and 4-5/10 with medication Frequency- daily. He does not take tramadol  daily however. 60 pills last him 90 days.  What increases pain- pain is constant What makes pain better- medication Effects on ADL- makes it slower Any change in general medical condition- no Current opioids rx- Tramadol  # meds rx- 60 Effectiveness of current meds- very good Adverse reactions from pain meds- none Morphine equivalent- 20 MME/day   Pill count performed-No Last drug screen - 09/26/23 ( high risk q64m, moderate risk q47m, low risk yearly ) Urine drug screen today- Yes Was the NCCSR reviewed- Yes              If yes were their any concerning findings? - No   Overdose risk: 190       10/19/2021    3:49 PM  Opioid Risk   Alcohol 3  Illegal Drugs 0  Rx Drugs 0  Alcohol 0  Illegal Drugs 0  Rx Drugs 0  Age between 16-45 years  0  History of Preadolescent Sexual Abuse 0  Psychological Disease 0  Depression 0  Opioid Risk Tool Scoring 3  Opioid Risk Interpretation Low Risk     Past Medical History:  Diagnosis Date   Arthritis    Bladder cancer (HCC) 02/16/2022   GERD (gastroesophageal reflux disease)    HLD (hyperlipidemia)    Hypertension associated with diabetes (HCC) 02/29/2016   Rosacea 01/28/2019   Type 1 diabetes (HCC)       ROS As per HPI.   Objective:     BP 133/74   Pulse 80   Temp 98 F (36.7 C) (Temporal)   Ht 6' (1.829 m)   Wt 207 lb 6.4 oz (94.1 kg)   SpO2 96%   BMI 28.13 kg/m  Wt Readings from Last 3 Encounters:  12/30/23 207 lb 6.4 oz (94.1 kg)  09/26/23 205 lb 3.2 oz (93.1 kg)  09/10/23 209 lb (94.8 kg)   BP Readings from Last 3 Encounters:  12/30/23 133/74  10/23/23 (!) 149/81  09/26/23 (!) 150/69    Physical Exam Vitals and nursing note reviewed.  Constitutional:      General: He is not in acute distress.    Appearance: Normal appearance. He is not ill-appearing, toxic-appearing or diaphoretic.  Eyes:     General: No scleral icterus. Cardiovascular:     Rate and Rhythm: Normal rate and regular rhythm.     Pulses: Normal pulses.     Heart sounds: Normal heart sounds. No murmur heard. Pulmonary:     Effort: Pulmonary effort is normal. No respiratory distress.     Breath sounds: Normal breath sounds.  Musculoskeletal:     Right lower leg: No edema.     Left lower leg: No edema.  Skin:    General: Skin is warm and dry.     Coloration: Skin is not jaundiced.  Neurological:     General: No focal deficit present.     Mental Status: He is alert and oriented to person, place, and time.  Psychiatric:        Mood and Affect: Mood  normal.        Behavior: Behavior normal.      No results found for any visits on 12/30/23.    The 10-year ASCVD risk score (Arnett DK, et al., 2019) is: 35.5%    Assessment & Plan:   Dustin Graham was seen today for medical management of chronic issues.  Diagnoses and all orders for this visit:  Type 1 diabetes mellitus with hyperglycemia (HCC) Managed by endo. Has not been well controlled, but improving.   Hypertension associated with type 1 diabetes mellitus (HCC) Well controlled on current regimen.   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 Last LDL at 87.  Gastroesophageal reflux disease without esophagitis Well controlled on current regimen.   Cervical spondylosis Spondylosis of lumbar spine Controlled substance agreement signed 60 pill fill has been lasting him 90 days. Requesting fewer follow ups as he pays a $100 copay. Agreed to 6 month follow up as long as he continues to use tramadol  sparingly. PDMP reviewed, no red flags. CSA and UDS are UTD.  -     traMADol  (ULTRAM ) 50 MG tablet; Take 2 tablets (100 mg total) by mouth every 12 (twelve) hours as needed.   Return in about 6 months (around 07/01/2024) for chronic follow up.   The patient indicates understanding of these issues and agrees with the plan.  Annabella CHRISTELLA Search, FNP

## 2023-12-31 ENCOUNTER — Ambulatory Visit (INDEPENDENT_AMBULATORY_CARE_PROVIDER_SITE_OTHER): Admitting: Nurse Practitioner

## 2023-12-31 ENCOUNTER — Encounter: Payer: Self-pay | Admitting: Nurse Practitioner

## 2023-12-31 VITALS — BP 110/76 | HR 70 | Ht 72.0 in | Wt 209.0 lb

## 2023-12-31 DIAGNOSIS — E559 Vitamin D deficiency, unspecified: Secondary | ICD-10-CM | POA: Diagnosis not present

## 2023-12-31 DIAGNOSIS — Z794 Long term (current) use of insulin: Secondary | ICD-10-CM

## 2023-12-31 DIAGNOSIS — E1065 Type 1 diabetes mellitus with hyperglycemia: Secondary | ICD-10-CM

## 2023-12-31 LAB — POCT GLYCOSYLATED HEMOGLOBIN (HGB A1C): Hemoglobin A1C: 7.4 % — AB (ref 4.0–5.6)

## 2023-12-31 MED ORDER — VITAMIN D (ERGOCALCIFEROL) 1.25 MG (50000 UNIT) PO CAPS: 50000.0000 [IU] | ORAL_CAPSULE | ORAL | 1 refills | Status: DC

## 2023-12-31 NOTE — Progress Notes (Signed)
 Endocrinology Follow Up Note       12/31/2023, 3:12 PM   Subjective:    Patient ID: Dustin Graham, male    DOB: February 26, 1951.  Dustin Graham is being seen in follow up after being seen in consultation for management of currently uncontrolled symptomatic diabetes requested by  Dustin Annabella HERO, FNP.   Past Medical History:  Diagnosis Date   Arthritis    Bladder cancer (HCC) 02/16/2022   GERD (gastroesophageal reflux disease)    HLD (hyperlipidemia)    Hypertension associated with diabetes (HCC) 02/29/2016   Rosacea 01/28/2019   Type 1 diabetes (HCC)     Past Surgical History:  Procedure Laterality Date   broken nose     CHOLECYSTECTOMY     CYSTOSCOPY W/ RETROGRADES Bilateral 02/15/2022   Procedure: CYSTOSCOPY WITH RETROGRADE PYELOGRAM;  Surgeon: Sherrilee Belvie CROME, MD;  Location: [facility];  Service: Urology;  Laterality: Bilateral;  pt knows to arrive at 8:00   GALLBLADDER SURGERY     LUMBAR DISC SURGERY     fracture   TRANSURETHRAL RESECTION OF BLADDER TUMOR N/A 02/15/2022   Procedure: TRANSURETHRAL RESECTION OF BLADDER TUMOR (TURBT);  Surgeon: Sherrilee Belvie CROME, MD;  Location: [facility];  Service: Urology;  Laterality: N/A;   TRANSURETHRAL RESECTION OF BLADDER TUMOR N/A 03/22/2022   Procedure: TRANSURETHRAL RESECTION OF BLADDER TUMOR (TURBT);  Surgeon: Sherrilee Belvie CROME, MD;  Location: [facility];  Service: Urology;  Laterality: N/A;    Social History   Socioeconomic History   Marital status: Divorced    Spouse name: Not on file   Number of children: 1   Years of education: 12   Highest education level: Some college, no degree  Occupational History    Employer: [employer]   Occupation: Estate manager/land agent    Comment: [employer]  Tobacco Use   Smoking status: Never   Smokeless tobacco: Never  Vaping Use   Vaping status: Never Used  Substance and Sexual Activity   Alcohol use: Yes     Alcohol/week: 3.0 - 4.0 standard drinks of alcohol    Types: 3 - 4 Standard drinks or equivalent per week    Comment: several beers and a couple of shots per day   Drug use: Not Currently    Types: Marijuana   Sexual activity: Not on file  Other Topics Concern   Not on file  Social History Narrative   Lives alone   Still working full time at [employer]   Daughter lives 20 miles away   Social Drivers of Health   Financial Resource Strain: Low Risk  (12/26/2023)   Overall Financial Resource Strain (CARDIA)    Difficulty of Paying Living Expenses: Not very hard  Food Insecurity: No Food Insecurity (12/26/2023)   Hunger Vital Sign    Worried About Running Out of Food in the Last Year: Never true    Ran Out of Food in the Last Year: Never true  Transportation Needs: No Transportation Needs (12/26/2023)   PRAPARE - Administrator, Civil Service (Medical): No    Lack of Transportation (Non-Medical): No  Physical Activity: Insufficiently Active (09/10/2023)   Exercise Vital Sign  Days of Exercise per Week: 3 days    Minutes of Exercise per Session: 30 min  Stress: Stress Concern Present (12/26/2023)   [employer] of Occupational Health - Occupational Stress Questionnaire    Feeling of Stress: To some extent  Social Connections: Socially Isolated (12/26/2023)   Social Connection and Isolation Panel    Frequency of Communication with Friends and Family: Once a week    Frequency of Social Gatherings with Friends and Family: Once a week    Attends Religious Services: Never    Database administrator or Organizations: No    Attends Engineer, structural: Not on file    Marital Status: Divorced    Family History  Problem Relation Age of Onset   Peptic Ulcer Father    Diabetes Paternal Aunt    Colon cancer Neg Hx    Esophageal cancer Neg Hx    Stomach cancer Neg Hx    Colon polyps Neg Hx     Outpatient Encounter Medications as of 12/31/2023  Medication  Sig   Accu-Chek Softclix Lancets lancets Check BS 3 times a day Morning, Noon and Bedtime Dx E11.9   Aspirin-Salicylamide-Caffeine (BC HEADACHE POWDER PO) Take 1 packet by mouth daily as needed (Back pain).   atorvastatin  (LIPITOR) 80 MG tablet Take 1 tablet (80 mg total) by mouth daily.   Blood Glucose Monitoring Suppl DEVI 1 each by Does not apply route in the morning, at noon, and at bedtime. May substitute to any manufacturer covered by patient's insurance.   enalapril  (VASOTEC ) 20 MG tablet TAKE 1 TABLET BY MOUTH EVERY DAY   glucose blood (ACCU-CHEK GUIDE) test strip Check BS 3 times a day Morning, Noon and Bedtime Dx E11.9   Insulin  Aspart FlexPen (NOVOLOG ) 100 UNIT/ML Inject 26-32 Units into the skin 3 (three) times daily with meals.   Insulin  Degludec FlexTouch 200 UNIT/ML SOPN INJECT 44 UNITS INTO THE SKIN AT BEDTIME.   Insulin  Pen Needle (BD PEN NEEDLE MICRO U/F) 32G X 6 MM MISC Use with injection 6 times daily Dx E10.9   levocetirizine (XYZAL ) 5 MG tablet TAKE 1 TABLET BY MOUTH EVERY DAY IN THE EVENING   omeprazole  (PRILOSEC) 20 MG capsule TAKE 1 CAPSULE BY MOUTH EVERY DAY   traMADol  (ULTRAM ) 50 MG tablet Take 2 tablets (100 mg total) by mouth every 12 (twelve) hours as needed.   Vitamin D , Ergocalciferol , (DRISDOL ) 1.25 MG (50000 UNIT) CAPS capsule Take 1 capsule (50,000 Units total) by mouth every 7 (seven) days.   [DISCONTINUED] Continuous Blood Gluc Sensor (FREESTYLE LIBRE 2 SENSOR) MISC Use to test blood sugar up to 6 times daily. DX: E11.9   [DISCONTINUED] traMADol  (ULTRAM ) 50 MG tablet Take 2 tablets (100 mg total) by mouth every 12 (twelve) hours as needed.   No facility-administered encounter medications on file as of 12/31/2023.    ALLERGIES: Allergies  Allergen Reactions   Codeine Nausea And Vomiting    VACCINATION STATUS: Immunization History  Administered Date(s) Administered    sv, Bivalent, Protein Subunit Rsvpref,pf (Abrysvo) 07/29/2023   Fluad Quad(high Dose  65+) 07/27/2020, 03/22/2021   Fluad Trivalent(High Dose 65+) 02/20/2023   Influenza Split 03/03/2014   Influenza, High Dose Seasonal PF 01/21/2019   Influenza-Unspecified 01/23/2019   Moderna Sars-Covid-2 Vaccination 07/02/2019, 07/22/2019, 07/31/2019, 02/15/2020, 09/18/2020   Pneumococcal Conjugate-13 09/26/2016   Pneumococcal Polysaccharide-23 07/23/2018   Zoster Recombinant(Shingrix ) 04/29/2023, 07/29/2023    Diabetes He presents for his follow-up diabetic visit. He has type 1 diabetes mellitus.  Onset time: Diagnosed at approx age of 64. His disease course has been stable. There are no hypoglycemic associated symptoms. There are no diabetic associated symptoms. There are no hypoglycemic complications. Symptoms are stable. Diabetic complications include heart disease (MI). Risk factors for coronary artery disease include diabetes mellitus, dyslipidemia, family history, male sex, sedentary lifestyle and hypertension. Current diabetic treatment includes intensive insulin  program. He is compliant with treatment most of the time (currently on Tresiba  24 units in am and 20 units in pm; Novolog  24-30 units TID). His weight is fluctuating minimally. He is following a generally unhealthy diet. When asked about meal planning, he reported none. He has not had a previous visit with a dietitian. He rarely participates in exercise. His home blood glucose trend is fluctuating minimally. His overall blood glucose range is 140-180 mg/dl. (He presents today with his CGM showing mostly at goal glycemic profile.  His POCT A1c today is 7.4%, increasing slightly from last visit of 7.2%.  Analysis of his CGM shows TIR 58%, TAR 42%, TBR 0% with a GMI of 7.5%.  He notes he has taken his insulin  after starting eating at times which coincides with the spikes in glucose after meals due to timing.) An ACE inhibitor/angiotensin II receptor blocker is being taken. He does not see a podiatrist.Eye exam is not current.     Review  of systems  Constitutional: + minimally fluctuating body weight, current Body mass index is 28.35 kg/m., no fatigue, no subjective hyperthermia, no subjective hypothermia Eyes: no blurry vision, no xerophthalmia ENT: no sore throat, no nodules palpated in throat, no dysphagia/odynophagia, no hoarseness Cardiovascular: no chest pain, no shortness of breath, no palpitations, no leg swelling Respiratory: no cough, no shortness of breath Gastrointestinal: no nausea/vomiting/diarrhea Musculoskeletal: no muscle/joint ache Skin: no rashes, no hyperemia Neurological: no tremors, no numbness, no tingling, no dizziness Psychiatric: no depression, no anxiety  Objective:     BP 110/76 (BP Location: Left Arm, Patient Position: Sitting, Cuff Size: Large)   Pulse 70   Ht 6' (1.829 m)   Wt 209 lb (94.8 kg)   BMI 28.35 kg/m   Wt Readings from Last 3 Encounters:  12/31/23 209 lb (94.8 kg)  12/30/23 207 lb 6.4 oz (94.1 kg)  09/26/23 205 lb 3.2 oz (93.1 kg)     BP Readings from Last 3 Encounters:  12/31/23 110/76  12/30/23 133/74  10/23/23 (!) 149/81     Physical Exam- Limited  Constitutional:  Body mass index is 28.35 kg/m. , not in acute distress, normal state of mind Eyes:  EOMI, no exophthalmos Musculoskeletal: no gross deformities, strength intact in all four extremities, no gross restriction of joint movements Skin:  no rashes, no hyperemia Neurological: no tremor with outstretched hands   Diabetic Foot Exam - Simple   Simple Foot Form Diabetic Foot exam was performed with the following findings: Yes 12/31/2023  3:10 PM  Visual Inspection No deformities, no ulcerations, no other skin breakdown bilaterally: Yes See comments: Yes Sensation Testing Intact to touch and monofilament testing bilaterally: Yes Pulse Check Posterior Tibialis and Dorsalis pulse intact bilaterally: Yes Comments Cap refill greater than 3 seconds bilaterally- toes cool to touch.     CMP ( most  recent) CMP     Component Value Date/Time   NA 136 12/26/2023 1050   K 4.6 12/26/2023 1050   CL 99 12/26/2023 1050   CO2 21 12/26/2023 1050   GLUCOSE 144 (H) 12/26/2023 1050   GLUCOSE 180 (H) 08/16/2021 1234  BUN 9 12/26/2023 1050   CREATININE 0.85 12/26/2023 1050   CALCIUM  9.5 12/26/2023 1050   PROT 7.0 12/26/2023 1050   ALBUMIN 4.2 12/26/2023 1050   AST 37 12/26/2023 1050   ALT 33 12/26/2023 1050   ALKPHOS 82 12/26/2023 1050   BILITOT 1.3 (H) 12/26/2023 1050   GFRNONAA 102 07/22/2020 0916   GFRAA 118 07/22/2020 0916     Diabetic Labs (most recent): Lab Results  Component Value Date   HGBA1C 7.4 (A) 12/31/2023   HGBA1C 7.2 (A) 08/29/2023   HGBA1C 7.5 (A) 03/14/2023   MICROALBUR 10mg /L 03/14/2023     Lipid Panel ( most recent) Lipid Panel     Component Value Date/Time   CHOL 184 02/27/2023 1002   TRIG 90 02/27/2023 1002   HDL 81 02/27/2023 1002   CHOLHDL 2.3 02/27/2023 1002   LDLCALC 87 02/27/2023 1002   LABVLDL 16 02/27/2023 1002      Lab Results  Component Value Date   TSH 1.670 12/26/2023   TSH 2.010 02/27/2023   TSH 1.330 09/21/2021   FREET4 1.19 12/26/2023           Assessment & Plan:   1) Type 1 diabetes mellitus without complication (HCC)  He presents today with his CGM showing mostly at goal glycemic profile.  His POCT A1c today is 7.4%, increasing slightly from last visit of 7.2%.  Analysis of his CGM shows TIR 58%, TAR 42%, TBR 0% with a GMI of 7.5%.  He notes he has taken his insulin  after starting eating at times which coincides with the spikes in glucose after meals due to timing.  - Demarquis Osley has currently uncontrolled symptomatic type 1 DM since 73 years of age.   -Recent labs reviewed.  - I had a long discussion with him about the progressive nature of diabetes and the pathology behind its complications. 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 These are all discussed in detail with him.  The following Lifestyle Medicine recommendations according to American College of Lifestyle Medicine [facility]) were discussed and offered to patient and he agrees to start the journey:  A. Whole Foods, Plant-based plate comprising of fruits and vegetables, plant-based proteins, whole-grain carbohydrates was discussed in detail with the patient.   A list for source of those nutrients were also provided to the patient.  Patient will use only water  or unsweetened tea for hydration. B.  The need to stay away from risky substances including alcohol, smoking; obtaining 7 to 9 hours of restorative sleep, at least 150 minutes of moderate intensity exercise weekly, the importance of healthy social connections,  and stress reduction techniques were discussed. C.  A full color page of  Calorie density of various food groups per pound showing examples of each food groups was provided to the patient.  - Nutritional counseling repeated at each appointment due to patients tendency to fall back in to old habits.  - The patient admits there is a room for improvement in their diet and drink choices. -  Suggestion is made for the patient to avoid simple carbohydrates from their diet including Cakes, Sweet Desserts / Pastries, Ice Cream, Soda (diet and regular), Sweet Tea, Candies, Chips, Cookies, Sweet Pastries, Store Bought Juices, Alcohol in Excess of 1-2 drinks a day, Artificial Sweeteners, Coffee Creamer, and Sugar-free Products. This will help patient to have stable blood  glucose profile and potentially avoid unintended weight gain.   - I encouraged the patient to switch to unprocessed or minimally processed complex starch and increased protein intake (animal or plant source), fruits, and vegetables.   - Patient is advised to stick to a routine mealtimes to eat 3 meals a day and avoid unnecessary snacks (to snack only to  correct hypoglycemia).  - I have approached him with the following individualized plan to manage his diabetes and patient agrees:   Given stable glycemic profile and lack of hypoglycemia, no changes will be made to his regimen today.  He is advised to continue his Tresiba  44 units SQ nightly, and Novolog  20-26 units TID with meals (usually only eating 2 meals per day) if glucose is above 90 and he is eating (Specific instructions on how to titrate insulin  dosage based on glucose readings given to patient in writing).   -he is encouraged to continue monitoring glucose 4 times daily (using his CGM), before meals and before bed, and to call the clinic if he has readings less than 70 or above 300 for 3 tests in a row.  - he is warned not to take insulin  without proper monitoring per orders. - Adjustment parameters are given to him for hypo and hyperglycemia in writing.  - he is not an ideal candidate of non-insulin  therapies given his type 1 diagnosis.  - Specific targets for  A1c; LDL, HDL, and Triglycerides were discussed with the patient.  2) Blood Pressure /Hypertension:  his blood pressure is controlled to target.   he is advised to continue his current medications including Enalapril  20 mg p.o. daily with breakfast.  Will recheck CMP prior to next visit.  3) Lipids/Hyperlipidemia:    Review of his recent lipid panel from 02/27/23 showed controlled LDL at 87 .  he is advised to continue Lipitor 40 mg daily at bedtime.  Side effects and precautions discussed with him.  4)  Weight/Diet:  his Body mass index is 28.35 kg/m.  -  clearly complicating his diabetes care.   he is a candidate for weight loss. I discussed with him the fact that loss of 5 - 10% of his  current body weight will have the most impact on his diabetes management.  Exercise, and detailed carbohydrates information provided  -  detailed on discharge instructions.  5) Chronic Care/Health Maintenance: -he is on ACEI/ARB and  Statin medications and is encouraged to initiate and continue to follow up with Ophthalmology, Dentist, Podiatrist at least yearly or according to recommendations, and advised to stay away from smoking. I have recommended yearly flu vaccine and pneumonia vaccine at least every 5 years; moderate intensity exercise for up to 150 minutes weekly; and sleep for at least 7 hours a day.  6) Vitamin d  deficiency His recent vitamin D  level on 7/31 was 11.5.  I discussed and initiated Ergocalciferol  50000 units po weekly x 24 weeks.  At which time we will recheck labs and transition to OTC if appropriate.  - he is advised to maintain close follow up with Dustin Annabella HERO, FNP for primary care needs, as well as his other providers for optimal and coordinated care.      I spent  43  minutes in the care of the patient today including review of labs from CMP, Lipids, Thyroid Function, Hematology (current and previous including abstractions from other facilities); face-to-face time discussing  his blood glucose readings/logs, discussing hypoglycemia and hyperglycemia episodes and symptoms, medications doses, his  options of short and long term treatment based on the latest standards of care / guidelines;  discussion about incorporating lifestyle medicine;  and documenting the encounter. Risk reduction counseling performed per USPSTF guidelines to reduce obesity and cardiovascular risk factors.     Please refer to Patient Instructions for Blood Glucose Monitoring and Insulin /Medications Dosing Guide  in media tab for additional information. Please  also refer to  Patient Self Inventory in the Media  tab for reviewed elements of pertinent patient history.  Elsie Gay participated in the discussions, expressed understanding, and voiced agreement with the above plans.  All questions were answered to his satisfaction. he is encouraged to contact clinic should he have any questions or concerns prior to his return  visit.     Follow up plan: - Return in about 4 months (around 05/01/2024) for Diabetes F/U with A1c in office, No previsit labs, Bring meter and logs.   Benton Rio, [facility] [address] [place], [place] [address] Phone: [phone] Fax: [phone]  12/31/2023, 3:12 PM

## 2024-01-01 ENCOUNTER — Other Ambulatory Visit: Payer: Self-pay | Admitting: Family Medicine

## 2024-01-01 DIAGNOSIS — K219 Gastro-esophageal reflux disease without esophagitis: Secondary | ICD-10-CM

## 2024-02-03 ENCOUNTER — Ambulatory Visit: Admitting: Urology

## 2024-02-03 VITALS — BP 122/78 | HR 109

## 2024-02-03 DIAGNOSIS — Z8551 Personal history of malignant neoplasm of bladder: Secondary | ICD-10-CM

## 2024-02-03 DIAGNOSIS — Z08 Encounter for follow-up examination after completed treatment for malignant neoplasm: Secondary | ICD-10-CM | POA: Diagnosis not present

## 2024-02-03 DIAGNOSIS — C671 Malignant neoplasm of dome of bladder: Secondary | ICD-10-CM

## 2024-02-03 LAB — URINALYSIS, ROUTINE W REFLEX MICROSCOPIC
Bilirubin, UA: NEGATIVE
Ketones, UA: NEGATIVE
Leukocytes,UA: NEGATIVE
Nitrite, UA: NEGATIVE
Protein,UA: NEGATIVE
RBC, UA: NEGATIVE
Specific Gravity, UA: 1.03 (ref 1.005–1.030)
Urobilinogen, Ur: 1 mg/dL (ref 0.2–1.0)
pH, UA: 6 (ref 5.0–7.5)

## 2024-02-03 MED ORDER — CIPROFLOXACIN HCL 500 MG PO TABS
500.0000 mg | ORAL_TABLET | Freq: Once | ORAL | Status: AC
  Administered 2024-02-03: 500 mg via ORAL

## 2024-02-03 NOTE — Progress Notes (Unsigned)
   02/03/24  CC: followup bladder cancer   HPI: Dustin Graham is a 72yo here for followup for bladder cancer Blood pressure 122/78, pulse (!) 109. NED. A&Ox3.   No respiratory distress   Abd soft, NT, ND Normal phallus with bilateral descended testicles  Cystoscopy Procedure Note  Patient identification was confirmed, informed consent was obtained, and patient was prepped using Betadine solution.  Lidocaine  jelly was administered per urethral meatus.     Pre-Procedure: - Inspection reveals a normal caliber ureteral meatus.  Procedure: The flexible cystoscope was introduced without difficulty - No urethral strictures/lesions are present. - Enlarged prostate  - Normal bladder neck - Bilateral ureteral orifices identified - Bladder mucosa  reveals no ulcers, tumors, or lesions - No bladder stones - No trabeculation    Post-Procedure: - Patient tolerated the procedure well  Assessment/ Plan: Followup 6 months for cystoscopy  No follow-ups on file.  Belvie Clara, MD

## 2024-02-04 ENCOUNTER — Encounter: Payer: Self-pay | Admitting: Urology

## 2024-02-04 NOTE — Patient Instructions (Signed)

## 2024-04-02 ENCOUNTER — Ambulatory Visit: Admitting: Nurse Practitioner

## 2024-04-05 ENCOUNTER — Other Ambulatory Visit: Payer: Self-pay | Admitting: *Deleted

## 2024-04-05 DIAGNOSIS — K219 Gastro-esophageal reflux disease without esophagitis: Secondary | ICD-10-CM

## 2024-05-21 ENCOUNTER — Other Ambulatory Visit: Payer: Self-pay | Admitting: Family Medicine

## 2024-05-21 DIAGNOSIS — E1159 Type 2 diabetes mellitus with other circulatory complications: Secondary | ICD-10-CM

## 2024-05-21 DIAGNOSIS — I152 Hypertension secondary to endocrine disorders: Secondary | ICD-10-CM

## 2024-05-29 ENCOUNTER — Telehealth: Payer: Self-pay | Admitting: *Deleted

## 2024-05-29 NOTE — Telephone Encounter (Signed)
 Waiting for the patient to return call for results.

## 2024-06-07 ENCOUNTER — Other Ambulatory Visit: Payer: Self-pay | Admitting: Family Medicine

## 2024-06-07 DIAGNOSIS — E782 Mixed hyperlipidemia: Secondary | ICD-10-CM

## 2024-06-12 ENCOUNTER — Other Ambulatory Visit: Payer: Self-pay | Admitting: Nurse Practitioner

## 2024-06-12 DIAGNOSIS — E1065 Type 1 diabetes mellitus with hyperglycemia: Secondary | ICD-10-CM

## 2024-06-17 ENCOUNTER — Other Ambulatory Visit: Payer: Self-pay | Admitting: Nurse Practitioner

## 2024-06-23 ENCOUNTER — Ambulatory Visit (INDEPENDENT_AMBULATORY_CARE_PROVIDER_SITE_OTHER): Admitting: Nurse Practitioner

## 2024-06-23 ENCOUNTER — Encounter: Payer: Self-pay | Admitting: Nurse Practitioner

## 2024-06-23 DIAGNOSIS — E1065 Type 1 diabetes mellitus with hyperglycemia: Secondary | ICD-10-CM

## 2024-06-23 LAB — POCT GLYCOSYLATED HEMOGLOBIN (HGB A1C): Hemoglobin A1C: 7.7 % — AB (ref 4.0–5.6)

## 2024-06-23 MED ORDER — PEN NEEDLES 31G X 5 MM MISC: 3 refills | Status: AC

## 2024-06-23 MED ORDER — INSULIN DEGLUDEC FLEXTOUCH 200 UNIT/ML ~~LOC~~ SOPN: 50.0000 [IU] | PEN_INJECTOR | Freq: Every evening | SUBCUTANEOUS | 3 refills | Status: AC

## 2024-06-23 MED ORDER — NOVOLOG FLEXPEN 100 UNIT/ML ~~LOC~~ SOPN: 30.0000 [IU] | PEN_INJECTOR | Freq: Three times a day (TID) | SUBCUTANEOUS | 3 refills | Status: AC

## 2024-06-23 NOTE — Addendum Note (Signed)
 Addended by: KRYSTAL MADELIN HERO on: 06/23/2024 03:19 PM   Modules accepted: Orders

## 2024-06-23 NOTE — Progress Notes (Signed)
 "                                                                        Endocrinology Follow Up Note       06/23/2024, 3:04 PM   Subjective:    Patient ID: Dustin Graham, male    DOB: 1950-10-25.  Dustin Graham is being seen in follow up after being seen in consultation for management of currently uncontrolled symptomatic diabetes requested by  Joesph Annabella HERO, FNP.   Past Medical History:  Diagnosis Date   Arthritis    Bladder cancer (HCC) 02/16/2022   GERD (gastroesophageal reflux disease)    HLD (hyperlipidemia)    Hypertension associated with diabetes (HCC) 02/29/2016   Rosacea 01/28/2019   Type 1 diabetes (HCC)     Past Surgical History:  Procedure Laterality Date   broken nose     CHOLECYSTECTOMY     CYSTOSCOPY W/ RETROGRADES Bilateral 02/15/2022   Procedure: CYSTOSCOPY WITH RETROGRADE PYELOGRAM;  Surgeon: Sherrilee Belvie CROME, MD;  Location: [facility];  Service: Urology;  Laterality: Bilateral;  pt knows to arrive at 8:00   GALLBLADDER SURGERY     LUMBAR DISC SURGERY     fracture   TRANSURETHRAL RESECTION OF BLADDER TUMOR N/A 02/15/2022   Procedure: TRANSURETHRAL RESECTION OF BLADDER TUMOR (TURBT);  Surgeon: Sherrilee Belvie CROME, MD;  Location: [facility];  Service: Urology;  Laterality: N/A;   TRANSURETHRAL RESECTION OF BLADDER TUMOR N/A 03/22/2022   Procedure: TRANSURETHRAL RESECTION OF BLADDER TUMOR (TURBT);  Surgeon: Sherrilee Belvie CROME, MD;  Location: [facility];  Service: Urology;  Laterality: N/A;    Social History   Socioeconomic History   Marital status: Divorced    Spouse name: Not on file   Number of children: 1   Years of education: 12   Highest education level: Some college, no degree  Occupational History    Employer: [employer]   Occupation: Estate manager/land agent    Comment: [employer]  Tobacco Use   Smoking status: Never   Smokeless tobacco: Never  Vaping Use   Vaping status: Never Used  Substance and Sexual Activity   Alcohol use: Yes     Alcohol/week: 3.0 - 4.0 standard drinks of alcohol    Types: 3 - 4 Standard drinks or equivalent per week    Comment: several beers and a couple of shots per day   Drug use: Not Currently    Types: Marijuana   Sexual activity: Not on file  Other Topics Concern   Not on file  Social History Narrative   Lives alone   Still working full time at [employer]   Daughter lives 20 miles away   Social Drivers of Health   Tobacco Use: Low Risk (06/23/2024)   Patient History    Smoking Tobacco Use: Never    Smokeless Tobacco Use: Never    Passive Exposure: Not on file  Financial Resource Strain: Low Risk (12/26/2023)   Overall Financial Resource Strain (CARDIA)    Difficulty of Paying Living Expenses: Not very hard  Food Insecurity: No Food Insecurity (12/26/2023)   Epic    Worried About Running Out of Food in the Last Year: Never true    Ran Out of  Food in the Last Year: Never true  Transportation Needs: No Transportation Needs (12/26/2023)   Epic    Lack of Transportation (Medical): No    Lack of Transportation (Non-Medical): No  Physical Activity: Insufficiently Active (09/10/2023)   Exercise Vital Sign    Days of Exercise per Week: 3 days    Minutes of Exercise per Session: 30 min  Stress: Stress Concern Present (12/26/2023)   [employer] of Occupational Health - Occupational Stress Questionnaire    Feeling of Stress: To some extent  Social Connections: Socially Isolated (12/26/2023)   Social Connection and Isolation Panel    Frequency of Communication with Friends and Family: Once a week    Frequency of Social Gatherings with Friends and Family: Once a week    Attends Religious Services: Never    Database Administrator or Organizations: No    Attends Engineer, Structural: Not on file    Marital Status: Divorced  Depression (PHQ2-9): Low Risk (12/30/2023)   Depression (PHQ2-9)    PHQ-2 Score: 0  Alcohol Screen: Low Risk (12/26/2023)   Alcohol Screen    Last  Alcohol Screening Score (AUDIT): 7  Housing: Low Risk (12/26/2023)   Epic    Unable to Pay for Housing in the Last Year: No    Number of Times Moved in the Last Year: 0    Homeless in the Last Year: No  Utilities: Not At Risk (09/10/2023)   AHC Utilities    Threatened with loss of utilities: No  Health Literacy: Not on file    Family History  Problem Relation Age of Onset   Peptic Ulcer Father    Diabetes Paternal Aunt    Colon cancer Neg Hx    Esophageal cancer Neg Hx    Stomach cancer Neg Hx    Colon polyps Neg Hx     Outpatient Encounter Medications as of 06/23/2024  Medication Sig   Accu-Chek Softclix Lancets lancets Check BS 3 times a day Morning, Noon and Bedtime Dx E11.9   Aspirin-Salicylamide-Caffeine (BC HEADACHE POWDER PO) Take 1 packet by mouth daily as needed (Back pain).   atorvastatin  (LIPITOR) 80 MG tablet TAKE 1 TABLET BY MOUTH EVERY DAY   Blood Glucose Monitoring Suppl DEVI 1 each by Does not apply route in the morning, at noon, and at bedtime. May substitute to any manufacturer covered by patient's insurance.   enalapril  (VASOTEC ) 20 MG tablet TAKE 1 TABLET BY MOUTH EVERY DAY   glucose blood (ACCU-CHEK GUIDE) test strip Check BS 3 times a day Morning, Noon and Bedtime Dx E11.9   Insulin  Pen Needle (PEN NEEDLES) 31G X 5 MM MISC Dispense based on patient and insurance preference. Use up to four times daily as directed. (FOR ICD-10 E10.9, E11.9).   levocetirizine (XYZAL ) 5 MG tablet TAKE 1 TABLET BY MOUTH EVERY DAY IN THE EVENING   omeprazole  (PRILOSEC) 20 MG capsule TAKE 1 CAPSULE BY MOUTH EVERY DAY   traMADol  (ULTRAM ) 50 MG tablet Take 2 tablets (100 mg total) by mouth every 12 (twelve) hours as needed.   Vitamin D , Ergocalciferol , (DRISDOL ) 1.25 MG (50000 UNIT) CAPS capsule TAKE 1 CAPSULE (50,000 UNITS TOTAL) BY MOUTH EVERY 7 (SEVEN) DAYS   [DISCONTINUED] insulin  aspart (NOVOLOG  FLEXPEN) 100 UNIT/ML FlexPen Inject 20-26 Units into the skin 3 (three) times daily with  meals. (Patient taking differently: Inject 20-26 Units into the skin 3 (three) times daily with meals. Patient states that he is injecting 30-35 units 3 times a  day.)   [DISCONTINUED] Insulin  Degludec FlexTouch 200 UNIT/ML SOPN INJECT 44 UNITS INTO THE SKIN AT BEDTIME. (Patient taking differently: Inject 44 Units into the skin at bedtime. Patient states that he is injecting 42-44 units at bedtime)   [DISCONTINUED] Insulin  Pen Needle (BD PEN NEEDLE MICRO U/F) 32G X 6 MM MISC Use with injection 6 times daily Dx E10.9   insulin  aspart (NOVOLOG  FLEXPEN) 100 UNIT/ML FlexPen Inject 30-36 Units into the skin 3 (three) times daily with meals.   Insulin  Degludec FlexTouch 200 UNIT/ML SOPN Inject 50 Units into the skin at bedtime.   No facility-administered encounter medications on file as of 06/23/2024.    ALLERGIES: Allergies  Allergen Reactions   Codeine Nausea And Vomiting    VACCINATION STATUS: Immunization History  Administered Date(s) Administered    sv, Bivalent, Protein Subunit Rsvpref,pf (Abrysvo) 07/29/2023   Fluad Quad(high Dose 65+) 07/27/2020, 03/22/2021   Fluad Trivalent(High Dose 65+) 02/20/2023   INFLUENZA, HIGH DOSE SEASONAL PF 01/21/2019   Influenza Split 03/03/2014   Influenza-Unspecified 01/23/2019, 04/17/2024   Moderna Sars-Covid-2 Vaccination 07/02/2019, 07/22/2019, 07/31/2019, 02/15/2020, 09/18/2020   Pneumococcal Conjugate-13 09/26/2016   Pneumococcal Polysaccharide-23 07/23/2018   Unspecified SARS-COV-2 Vaccination 04/17/2024   Zoster Recombinant(Shingrix ) 04/29/2023, 07/29/2023    Diabetes He presents for his follow-up diabetic visit. He has type 1 diabetes mellitus. Onset time: Diagnosed at approx age of 22. His disease course has been stable. There are no hypoglycemic associated symptoms. There are no diabetic associated symptoms. There are no hypoglycemic complications. Symptoms are stable. Diabetic complications include heart disease (MI). Risk factors for coronary  artery disease include diabetes mellitus, dyslipidemia, family history, male sex, sedentary lifestyle and hypertension. Current diabetic treatment includes intensive insulin  program. He is compliant with treatment most of the time (currently on Tresiba  24 units in am and 20 units in pm; Novolog  24-30 units TID). His weight is fluctuating minimally. He is following a generally unhealthy diet. When asked about meal planning, he reported none. He has not had a previous visit with a dietitian. He rarely participates in exercise. His home blood glucose trend is fluctuating minimally. His overall blood glucose range is 140-180 mg/dl. (He presents today with his CGM showing mostly at goal glycemic profile.  His POCT A1c today is 7.7%, increasing slightly from last visit of 7.4%.  Analysis of his CGM shows TIR 63%, TAR 37%, TBR 0% with a GMI of 7.2%.  He had done better in recent days managing his glucose because he was forced to eat at home due to winter weather.  We did go over the positives of eating at home more often.) An ACE inhibitor/angiotensin II receptor blocker is being taken. He does not see a podiatrist.Eye exam is not current.     Review of systems  Constitutional: + Minimally fluctuating body weight,  current Body mass index is 28.67 kg/m. , no fatigue, no subjective hyperthermia, no subjective hypothermia Eyes: no blurry vision, no xerophthalmia ENT: no sore throat, no nodules palpated in throat, no dysphagia/odynophagia, no hoarseness Cardiovascular: no chest pain, no shortness of breath, no palpitations, no leg swelling Respiratory: no cough, no shortness of breath Gastrointestinal: no nausea/vomiting/diarrhea Musculoskeletal: no muscle/joint aches Skin: no rashes, no hyperemia Neurological: no tremors, no numbness, no tingling, no dizziness Psychiatric: no depression, no anxiety  Objective:     BP 128/70 (BP Location: Left Arm, Patient Position: Sitting, Cuff Size: Large)   Pulse (!)  106   Ht 6' (1.829 m)   Wt 211 lb 6.4  oz (95.9 kg)   BMI 28.67 kg/m   Wt Readings from Last 3 Encounters:  06/23/24 211 lb 6.4 oz (95.9 kg)  12/31/23 209 lb (94.8 kg)  12/30/23 207 lb 6.4 oz (94.1 kg)     BP Readings from Last 3 Encounters:  06/23/24 128/70  02/03/24 122/78  12/31/23 110/76      Physical Exam- Limited  Constitutional:  Body mass index is 28.67 kg/m. , not in acute distress, normal state of mind Eyes:  EOMI, no exophthalmos Musculoskeletal: no gross deformities, strength intact in all four extremities, no gross restriction of joint movements Skin:  no rashes, no hyperemia Neurological: no tremor with outstretched hands   Diabetic Foot Exam - Simple   No data filed     CMP ( most recent) CMP     Component Value Date/Time   NA 136 12/26/2023 1050   K 4.6 12/26/2023 1050   CL 99 12/26/2023 1050   CO2 21 12/26/2023 1050   GLUCOSE 144 (H) 12/26/2023 1050   GLUCOSE 180 (H) 08/16/2021 1234   BUN 9 12/26/2023 1050   CREATININE 0.85 12/26/2023 1050   CALCIUM  9.5 12/26/2023 1050   PROT 7.0 12/26/2023 1050   ALBUMIN 4.2 12/26/2023 1050   AST 37 12/26/2023 1050   ALT 33 12/26/2023 1050   ALKPHOS 82 12/26/2023 1050   BILITOT 1.3 (H) 12/26/2023 1050   GFRNONAA 102 07/22/2020 0916   GFRAA 118 07/22/2020 0916     Diabetic Labs (most recent): Lab Results  Component Value Date   HGBA1C 7.4 (A) 12/31/2023   HGBA1C 7.2 (A) 08/29/2023   HGBA1C 7.5 (A) 03/14/2023   MICROALBUR 10mg /L 03/14/2023     Lipid Panel ( most recent) Lipid Panel     Component Value Date/Time   CHOL 184 02/27/2023 1002   TRIG 90 02/27/2023 1002   HDL 81 02/27/2023 1002   CHOLHDL 2.3 02/27/2023 1002   LDLCALC 87 02/27/2023 1002   LABVLDL 16 02/27/2023 1002      Lab Results  Component Value Date   TSH 1.670 12/26/2023   TSH 2.010 02/27/2023   TSH 1.330 09/21/2021   FREET4 1.19 12/26/2023           Assessment & Plan:   1) Type 1 diabetes mellitus without  complication (HCC)  He presents today with his CGM showing mostly at goal glycemic profile.  His POCT A1c today is 7.7%, increasing slightly from last visit of 7.4%.  Analysis of his CGM shows TIR 63%, TAR 37%, TBR 0% with a GMI of 7.2%.  He had done better in recent days managing his glucose because he was forced to eat at home due to winter weather.  We did go over the positives of eating at home more often.  - Bernd Crom has currently uncontrolled symptomatic type 1 DM since 74 years of age.   -Recent labs reviewed.  - I had a long discussion with him about the progressive nature of diabetes and the pathology behind its complications. 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 These are all discussed in detail with him.  The following Lifestyle Medicine recommendations according to American College of Lifestyle Medicine [facility]) were discussed and offered to patient and he agrees to start the journey:  A. Whole Foods, Plant-based plate comprising of fruits and vegetables, plant-based proteins, whole-grain carbohydrates was discussed in detail  with the patient.   A list for source of those nutrients were also provided to the patient.  Patient will use only water  or unsweetened tea for hydration. B.  The need to stay away from risky substances including alcohol, smoking; obtaining 7 to 9 hours of restorative sleep, at least 150 minutes of moderate intensity exercise weekly, the importance of healthy social connections,  and stress reduction techniques were discussed. C.  A full color page of  Calorie density of various food groups per pound showing examples of each food groups was provided to the patient.  - Nutritional counseling repeated/built upon at each appointment.  - The patient admits there is a room for improvement in their diet and drink choices. -   Suggestion is made for the patient to avoid simple carbohydrates from their diet including Cakes, Sweet Desserts / Pastries, Ice Cream, Soda (diet and regular), Sweet Tea, Candies, Chips, Cookies, Sweet Pastries, Store Bought Juices, Alcohol in Excess of 1-2 drinks a day, Artificial Sweeteners, Coffee Creamer, and Sugar-free Products. This will help patient to have stable blood glucose profile and potentially avoid unintended weight gain.   - I encouraged the patient to switch to unprocessed or minimally processed complex starch and increased protein intake (animal or plant source), fruits, and vegetables.   - Patient is advised to stick to a routine mealtimes to eat 3 meals a day and avoid unnecessary snacks (to snack only to correct hypoglycemia).  - I have approached him with the following individualized plan to manage his diabetes and patient agrees:   Given stable glycemic profile and lack of hypoglycemia, no changes will be made to his regimen today.  He is advised to continue his Tresiba  44 units SQ nightly, and Novolog  30-36 units TID with meals (usually only eating 2 meals per day) if glucose is above 90 and he is eating (Specific instructions on how to titrate insulin  dosage based on glucose readings given to patient in writing).   -he is encouraged to continue monitoring glucose 4 times daily (using his CGM), before meals and before bed, and to call the clinic if he has readings less than 70 or above 300 for 3 tests in a row.  - he is warned not to take insulin  without proper monitoring per orders. - Adjustment parameters are given to him for hypo and hyperglycemia in writing.  - he is not an ideal candidate of non-insulin  therapies given his type 1 diagnosis.  - Specific targets for  A1c; LDL, HDL, and Triglycerides were discussed with the patient.  2) Blood Pressure /Hypertension:  his blood pressure is controlled to target.   he is advised to continue his current medications as  prescribed by PCP.   3) Lipids/Hyperlipidemia:    Review of his recent lipid panel from 02/27/23 showed controlled LDL at 87 .  he is advised to continue Lipitor 40 mg daily at bedtime.  Side effects and precautions discussed with him.  4)  Weight/Diet:  his Body mass index is 28.67 kg/m.  -  clearly complicating his diabetes care.   he is a candidate for weight loss. I discussed with him the fact that loss of 5 - 10% of his  current body weight will have the most impact on his diabetes management.  Exercise, and detailed carbohydrates information provided  -  detailed on discharge instructions.  5) Chronic Care/Health Maintenance: -he is on ACEI/ARB and Statin medications and is encouraged to initiate and continue to follow up with  Ophthalmology, Dentist, Podiatrist at least yearly or according to recommendations, and advised to stay away from smoking. I have recommended yearly flu vaccine and pneumonia vaccine at least every 5 years; moderate intensity exercise for up to 150 minutes weekly; and sleep for at least 7 hours a day.  6) Vitamin d  deficiency His recent vitamin D  level on 7/31 was 11.5.  I discussed and initiated Ergocalciferol  50000 units po weekly x 24 weeks.  At which time we will recheck labs and transition to OTC if appropriate.  - he is advised to maintain close follow up with Joesph Annabella HERO, FNP for primary care needs, as well as his other providers for optimal and coordinated care.      I spent  42  minutes in the care of the patient today including review of labs from CMP, Lipids, Thyroid Function, Hematology (current and previous including abstractions from other facilities); face-to-face time discussing  his blood glucose readings/logs, discussing hypoglycemia and hyperglycemia episodes and symptoms, medications doses, his options of short and long term treatment based on the latest standards of care / guidelines;  discussion about incorporating lifestyle medicine;  and  documenting the encounter. Risk reduction counseling performed per USPSTF guidelines to reduce obesity and cardiovascular risk factors.     Please refer to Patient Instructions for Blood Glucose Monitoring and Insulin /Medications Dosing Guide  in media tab for additional information. Please  also refer to  Patient Self Inventory in the Media  tab for reviewed elements of pertinent patient history.  Elsie Gay participated in the discussions, expressed understanding, and voiced agreement with the above plans.  All questions were answered to his satisfaction. he is encouraged to contact clinic should he have any questions or concerns prior to his return visit.    Follow up plan: - Return in about 4 months (around 10/21/2024) for Diabetes F/U with A1c in office, No previsit labs, Bring meter and logs.   Benton Rio, [facility] [address] [place], [place] [address] Phone: [phone] Fax: [phone]  06/23/2024, 3:04 PM    "

## 2024-07-02 ENCOUNTER — Ambulatory Visit: Payer: Self-pay | Admitting: Family Medicine

## 2024-07-10 ENCOUNTER — Ambulatory Visit: Admitting: Family Medicine

## 2024-08-03 ENCOUNTER — Other Ambulatory Visit: Admitting: Urology

## 2024-09-10 ENCOUNTER — Ambulatory Visit: Payer: Self-pay

## 2024-10-21 ENCOUNTER — Ambulatory Visit: Admitting: Nurse Practitioner
